# Patient Record
Sex: Male | Born: 1940
Health system: Southern US, Community
[De-identification: ages and names within clinical notes are randomized; demographics above are authoritative.]

## PROBLEM LIST (undated history)

## (undated) ENCOUNTER — Inpatient Hospital Stay: Admission: EM | Payer: Self-pay | Source: Home / Self Care

## (undated) DIAGNOSIS — C4491 Basal cell carcinoma of skin, unspecified: Secondary | ICD-10-CM

## (undated) DIAGNOSIS — M3500 Sicca syndrome, unspecified: Secondary | ICD-10-CM

## (undated) DIAGNOSIS — B0229 Other postherpetic nervous system involvement: Secondary | ICD-10-CM

## (undated) DIAGNOSIS — H35352 Cystoid macular degeneration, left eye: Secondary | ICD-10-CM

## (undated) HISTORY — PX: OTHER SURGICAL HISTORY: SHX169

---

## 1998-12-24 ENCOUNTER — Encounter: Payer: Self-pay | Admitting: Orthopedic Surgery

## 1998-12-24 ENCOUNTER — Ambulatory Visit (HOSPITAL_COMMUNITY): Admission: RE | Admit: 1998-12-24 | Discharge: 1998-12-24 | Payer: Self-pay | Admitting: Orthopedic Surgery

## 2008-01-23 ENCOUNTER — Encounter: Admission: RE | Admit: 2008-01-23 | Discharge: 2008-01-23 | Payer: Self-pay | Admitting: Chiropractic Medicine

## 2009-09-08 IMAGING — CR DG LUMBAR SPINE 1V
1 series · 1 of 1 positions shown · non-contrast
Comparison: none

CLINICAL DATA: Pain. 
 LUMBAR SPINE ONE VIEW:
 Only a lateral view of the lumbar spine was requested.  There is degenerative disc disease most marked at L4-5 but also present at L5-S1 and L2-3 levels with loss of disc space, sclerosis, and some spur formation.  Grossly normal alignment is maintained.

[w l-spine lat]
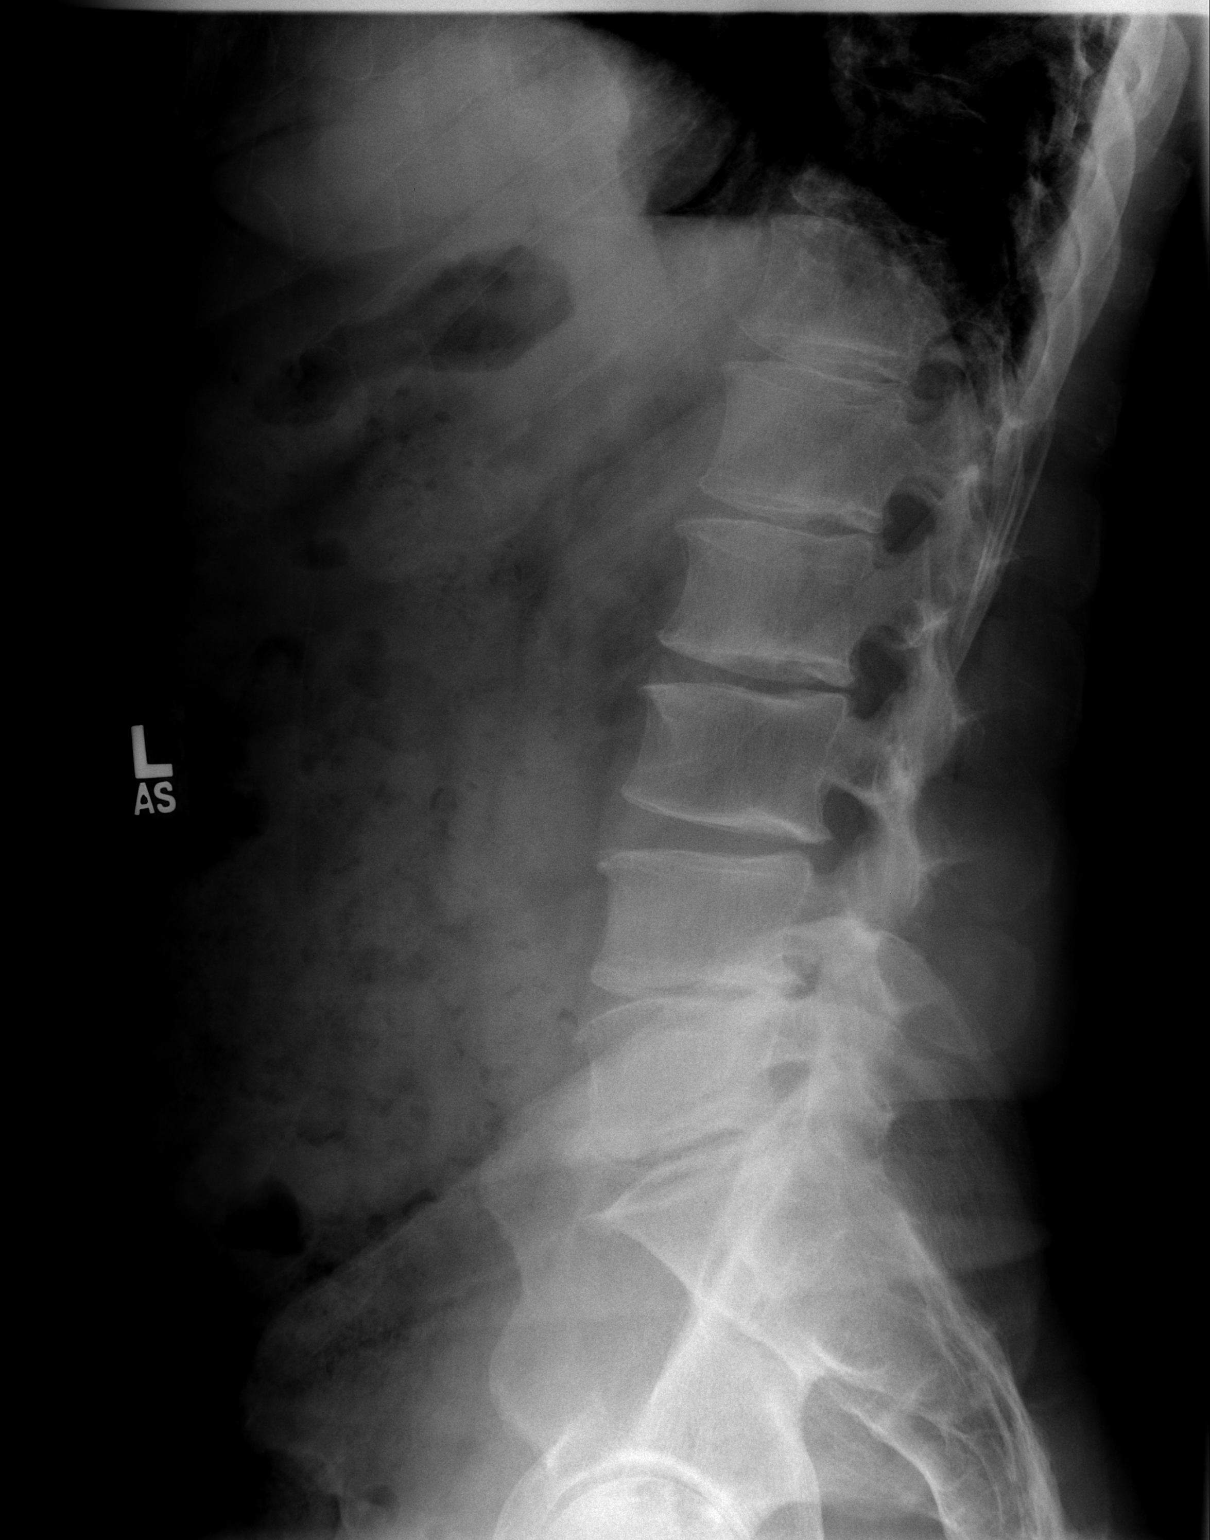

[1 of 1 positions shown; findings below may reference images not displayed]

IMPRESSION: Degenerative disc disease at L4-5 and to a lesser degree L2-3 and L5-S1.

## 2009-09-08 IMAGING — CR DG CERVICAL SPINE 2 OR 3 VIEWS
3 series · 3 of 3 positions shown · non-contrast
Comparison: none

CLINICAL DATA: Cervical radiculopathy.  
 CERVICAL SPINE TWO VIEWS:

[w c-spine lat *]
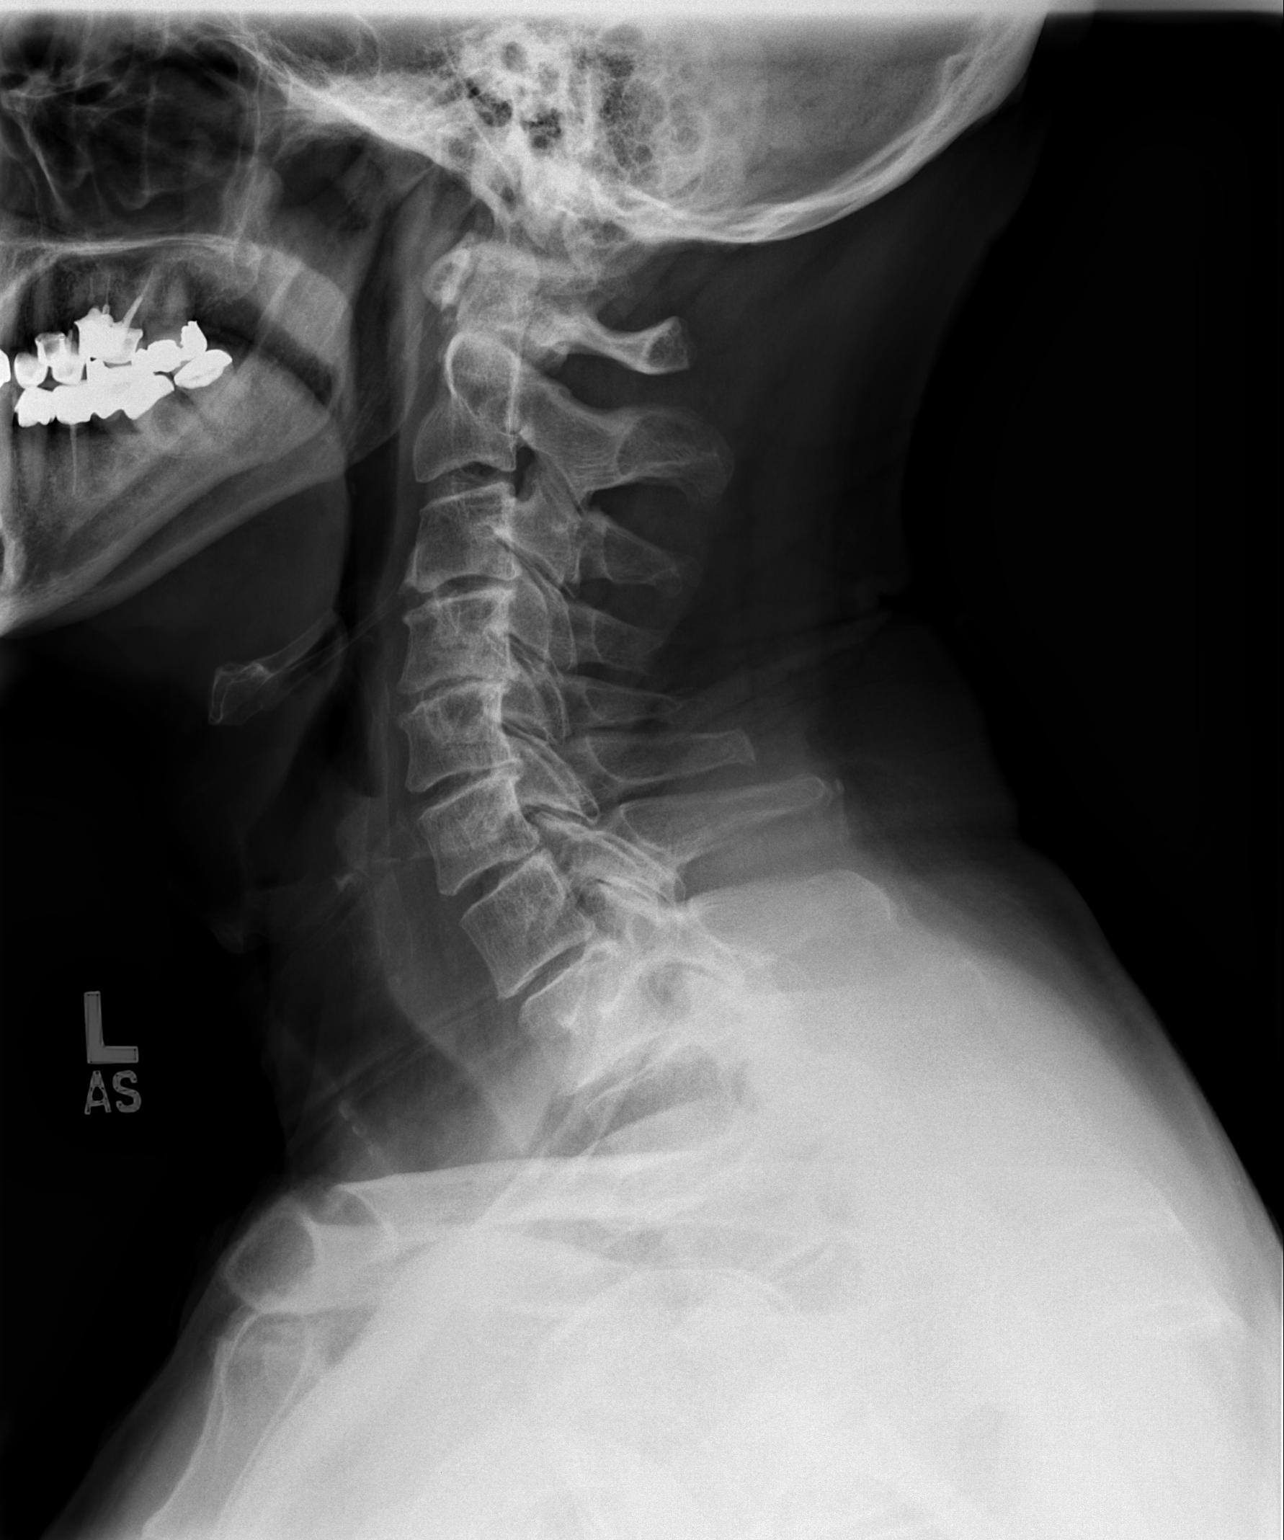

[w c-spine odontoid * (1 of 2)]
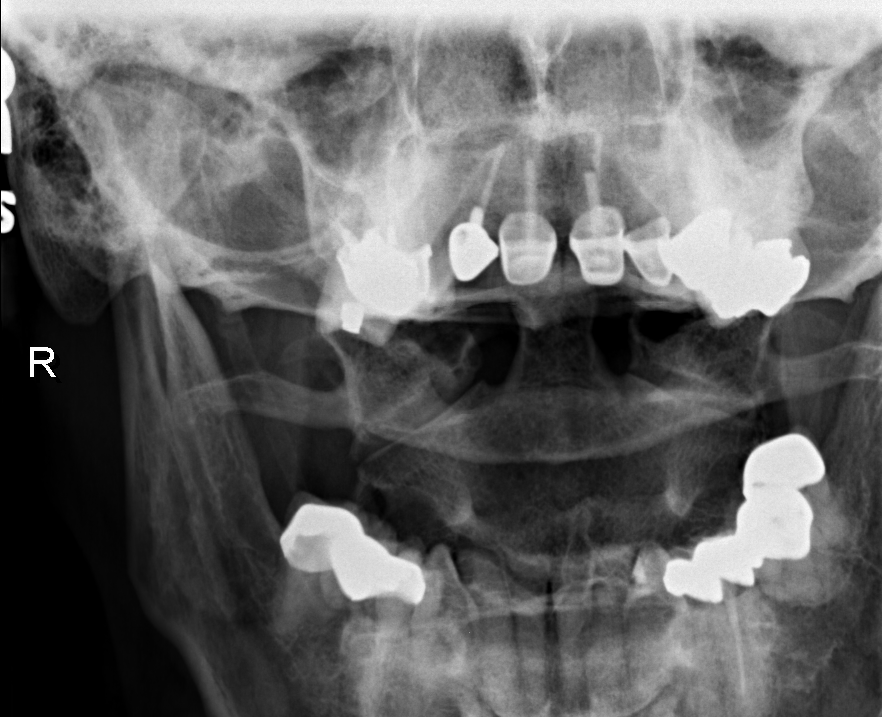

[w c-spine odontoid * (2 of 2)]
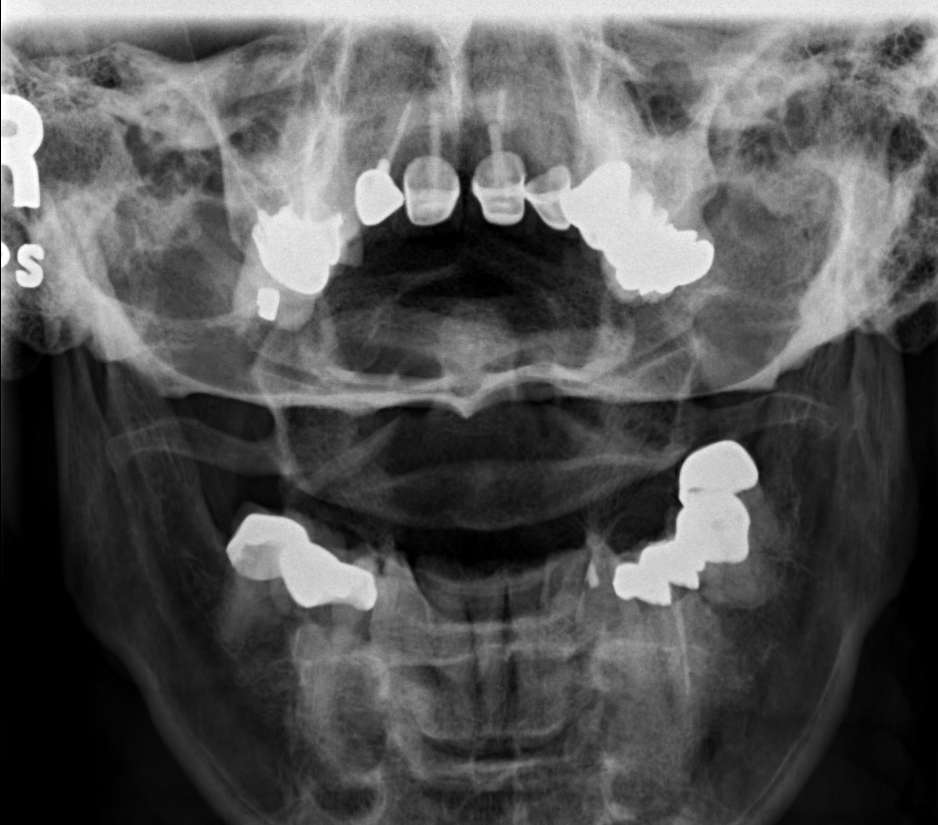

[3 of 3 positions shown; findings below may reference images not displayed]

FINDINGS: The cervical vertebrae are in normal alignment.  There is however degenerative disc disease at C3-4, C4-5, C5-6, and to a lesser degree C6-7 levels with a loss of disc space, sclerosis, and spur formation.  No prevertebral soft tissue swelling is seen.  The odontoid process is intact.  Oblique views would be helpful to assess for foraminal narrowing.
IMPRESSION: Normal alignment with diffuse degenerative disc disease from C3-4 to C6-7.  Recommend oblique views to assess for foraminal narrowing.  No acute abnormality.

## 2013-02-21 ENCOUNTER — Other Ambulatory Visit: Payer: Self-pay | Admitting: Gastroenterology

## 2013-03-02 ENCOUNTER — Encounter (HOSPITAL_COMMUNITY): Payer: Self-pay | Admitting: *Deleted

## 2013-03-02 NOTE — Pre-Procedure Instructions (Signed)
Your procedure is scheduled ZO:[id number], March 13, 2013 Report to [facility] Admitting at: Call this number if you have problems morning of your [phone]  Follow all bowel prep instructions per your doctor's orders.  Do not eat or drink anything after midnight the night before your procedure. You may brush your teeth, rinse out your mouth, but no water, no food, no chewing gum, no mints, no candies, no chewing tobacco.     Take these medicines the morning of your procedure with A SIP OF WATER:None   Please make arrangements for a responsible person to drive you home after the procedure. You cannot go home by cab/taxi. We recommend you have someone with you at home the first 24 hours after your procedure. Driver for procedure is wife Mohamad Bruso [phone]  LEAVE ALL VALUABLES, JEWELRY, BILLFOLD AT HOME.  NO DENTURES, CONTACT LENSES ALLOWED IN THE ENDOSCOPY ROOM.   YOU MAY WEAR DEODORANT, PLEASE REMOVE ALL JEWELRY, WATCHES RINGS, BODY PIERCINGS AND LEAVE AT HOME.   WOMEN: NO MAKE-UP, LOTIONS PERFUMES

## 2013-03-06 ENCOUNTER — Encounter (HOSPITAL_COMMUNITY): Payer: Self-pay | Admitting: Pharmacy Technician

## 2013-03-13 ENCOUNTER — Encounter (HOSPITAL_COMMUNITY): Payer: Self-pay | Admitting: Anesthesiology

## 2013-03-13 ENCOUNTER — Ambulatory Visit (HOSPITAL_COMMUNITY)
Admission: RE | Admit: 2013-03-13 | Discharge: 2013-03-13 | Disposition: A | Discharge status: Hospice | Payer: Medicare Other | Source: Ambulatory Visit | Attending: Gastroenterology | Admitting: Gastroenterology

## 2013-03-13 ENCOUNTER — Ambulatory Visit (HOSPITAL_COMMUNITY): Payer: Medicare Other | Admitting: Anesthesiology

## 2013-03-13 ENCOUNTER — Encounter (HOSPITAL_COMMUNITY)
Admission: RE | Disposition: A | Discharge status: Hospice | Payer: Self-pay | Source: Ambulatory Visit | Attending: Gastroenterology

## 2013-03-13 DIAGNOSIS — Z85828 Personal history of other malignant neoplasm of skin: Secondary | ICD-10-CM | POA: Insufficient documentation

## 2013-03-13 DIAGNOSIS — Z1211 Encounter for screening for malignant neoplasm of colon: Secondary | ICD-10-CM | POA: Insufficient documentation

## 2013-03-13 DIAGNOSIS — M1990 Unspecified osteoarthritis, unspecified site: Secondary | ICD-10-CM | POA: Insufficient documentation

## 2013-03-13 HISTORY — PX: COLONOSCOPY WITH PROPOFOL: SHX5780

## 2013-03-13 HISTORY — DX: Basal cell carcinoma of skin, unspecified: C44.91

## 2013-03-13 SURGERY — COLONOSCOPY WITH PROPOFOL
Anesthesia: Monitor Anesthesia Care

## 2013-03-13 MED ORDER — LACTATED RINGERS IV SOLN
INTRAVENOUS | Status: DC | PRN
  Administered 2013-03-13: 08:00:00 via INTRAVENOUS

## 2013-03-13 MED ORDER — SODIUM CHLORIDE 0.9 % IV SOLN: INTRAVENOUS | Status: DC

## 2013-03-13 MED ORDER — LACTATED RINGERS IV SOLN
INTRAVENOUS | Status: DC
  Administered 2013-03-13: 1000 mL via INTRAVENOUS

## 2013-03-13 MED ORDER — PROMETHAZINE HCL 25 MG/ML IJ SOLN: 6.2500 mg | INTRAMUSCULAR | Status: DC | PRN

## 2013-03-13 MED ORDER — MIDAZOLAM HCL 5 MG/5ML IJ SOLN
INTRAMUSCULAR | Status: DC | PRN
  Administered 2013-03-13: 1 mg via INTRAVENOUS

## 2013-03-13 MED ORDER — FENTANYL CITRATE 0.05 MG/ML IJ SOLN
INTRAMUSCULAR | Status: DC | PRN
  Administered 2013-03-13: 50 ug via INTRAVENOUS

## 2013-03-13 MED ORDER — PROPOFOL 10 MG/ML IV BOLUS
INTRAVENOUS | Status: DC | PRN
  Administered 2013-03-13: 50 mg via INTRAVENOUS
  Administered 2013-03-13: 30 mg via INTRAVENOUS
  Administered 2013-03-13: 20 mg via INTRAVENOUS

## 2013-03-13 SURGICAL SUPPLY — 21 items

## 2013-03-13 NOTE — Anesthesia Preprocedure Evaluation (Addendum)
Anesthesia Evaluation  Patient identified by MRN, date of birth, ID band Patient awake    Reviewed: Allergy & Precautions, H&P , NPO status , Patient's Chart, lab work & pertinent test results  History of Anesthesia Complications (+) AWARENESS UNDER ANESTHESIA  Airway Mallampati: II TM Distance: >3 FB     Dental no notable dental hx. (+) Teeth Intact and Dental Advisory Given   Pulmonary neg pulmonary ROS,    Pulmonary exam normal       Cardiovascular negative cardio ROS      Neuro/Psych negative neurological ROS  negative psych ROS   GI/Hepatic negative GI ROS, Neg liver ROS,   Endo/Other  negative endocrine ROS  Renal/GU negative Renal ROS  negative genitourinary   Musculoskeletal negative musculoskeletal ROS (+)   Abdominal   Peds  Hematology negative hematology ROS (+)   Anesthesia Other Findings   Reproductive/Obstetrics negative OB ROS                        Anesthesia Physical Anesthesia Plan  ASA: I  Anesthesia Plan: MAC   Post-op Pain Management:    Induction: Intravenous  Airway Management Planned: Simple Face Mask  Additional Equipment:   Intra-op Plan:   Post-operative Plan:   Informed Consent: I have reviewed the patients History and Physical, chart, labs and discussed the procedure including the risks, benefits and alternatives for the proposed anesthesia with the patient or authorized representative who has indicated his/her understanding and acceptance.   Dental advisory given  Plan Discussed with: CRNA  Anesthesia Plan Comments:         Anesthesia Quick Evaluation

## 2013-03-13 NOTE — H&P (Signed)
  Procedure screening colonoscopy  History: The patient is a 72 year old male born 1941/10/07. On 01/31/2001, the patient underwent a normal baseline screening colonoscopy. He is scheduled to undergo a repeat screening colonoscopy today.  Medication allergies: None.  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 Degenerative joint disease.  Habits: The patient does not smoke cigarettes and consumes alcohol in moderation.  Plan: Proceed with screening colonoscopy.

## 2013-03-13 NOTE — Op Note (Signed)
Procedure: Screening colonoscopy  Endoscopist: Danise Edge  Premedication: Propofol administered by anesthesia  Procedure: The patient was placed in the left lateral decubitus position. Anal inspection and digital rectal exam were normal. The Pentax pediatric colonoscope was introduced into the rectum and easily advanced to the cecum. A normal-appearing ileocecal valve and appendiceal orifice were identified. Colonic preparation for the exam today was good.  Rectum. Normal. Retroflex view of the distal rectum normal.  Sigmoid colon and descending colon. Normal.  Splenic flexure. Normal.  Transverse colon. Normal.  Hepatic flexure. Normal. Descending colon..  Ascending colon. Normal.  Cecum and ileocecal valve. Normal.  Assessment: Normal screening proctocolonoscopy to the cecum.

## 2013-03-13 NOTE — Anesthesia Postprocedure Evaluation (Signed)
Anesthesia Post Note  Patient: Sean Skinner  Procedure(s) Performed: Procedure(s) (LRB): COLONOSCOPY WITH PROPOFOL (N/A)  Anesthesia type: MAC  Patient location: PACU  Post pain: Pain level controlled  Post assessment: Post-op Vital signs reviewed  Last Vitals:  Filed Vitals:   03/13/13 0950  BP: 132/77  Pulse:   Temp:   Resp: 15    Post vital signs: Reviewed  Level of consciousness: sedated  Complications: No apparent anesthesia complications

## 2013-03-13 NOTE — Transfer of Care (Signed)
Immediate Anesthesia Transfer of Care Note  Patient: Sean Skinner  Procedure(s) Performed: Procedure(s): COLONOSCOPY WITH PROPOFOL (N/A)  Patient Location: PACU  Anesthesia Type:MAC  Level of Consciousness: awake, alert , oriented and patient cooperative  Airway & Oxygen Therapy: Patient Spontanous Breathing and Patient connected to face mask oxygen  Post-op Assessment: Report given to PACU RN and Post -op Vital signs reviewed and stable  Post vital signs: Reviewed and stable  Complications: No apparent anesthesia complications

## 2013-03-14 ENCOUNTER — Encounter (HOSPITAL_COMMUNITY): Payer: Self-pay | Admitting: Gastroenterology

## 2016-04-26 DIAGNOSIS — J40 Bronchitis, not specified as acute or chronic: Secondary | ICD-10-CM | POA: Diagnosis not present

## 2016-08-05 DIAGNOSIS — C4442 Squamous cell carcinoma of skin of scalp and neck: Secondary | ICD-10-CM | POA: Diagnosis not present

## 2016-08-05 DIAGNOSIS — C44622 Squamous cell carcinoma of skin of right upper limb, including shoulder: Secondary | ICD-10-CM | POA: Diagnosis not present

## 2016-08-05 DIAGNOSIS — L570 Actinic keratosis: Secondary | ICD-10-CM | POA: Diagnosis not present

## 2016-08-05 DIAGNOSIS — C44629 Squamous cell carcinoma of skin of left upper limb, including shoulder: Secondary | ICD-10-CM | POA: Diagnosis not present

## 2016-10-02 DIAGNOSIS — Z1389 Encounter for screening for other disorder: Secondary | ICD-10-CM | POA: Diagnosis not present

## 2016-10-02 DIAGNOSIS — Z0000 Encounter for general adult medical examination without abnormal findings: Secondary | ICD-10-CM | POA: Diagnosis not present

## 2016-10-27 DIAGNOSIS — L814 Other melanin hyperpigmentation: Secondary | ICD-10-CM | POA: Diagnosis not present

## 2016-10-27 DIAGNOSIS — L570 Actinic keratosis: Secondary | ICD-10-CM | POA: Diagnosis not present

## 2016-10-27 DIAGNOSIS — C44529 Squamous cell carcinoma of skin of other part of trunk: Secondary | ICD-10-CM | POA: Diagnosis not present

## 2016-10-27 DIAGNOSIS — Z85828 Personal history of other malignant neoplasm of skin: Secondary | ICD-10-CM | POA: Diagnosis not present

## 2016-10-27 DIAGNOSIS — D034 Melanoma in situ of scalp and neck: Secondary | ICD-10-CM | POA: Diagnosis not present

## 2016-10-27 DIAGNOSIS — D485 Neoplasm of uncertain behavior of skin: Secondary | ICD-10-CM | POA: Diagnosis not present

## 2016-11-06 DIAGNOSIS — D0359 Melanoma in situ of other part of trunk: Secondary | ICD-10-CM | POA: Diagnosis not present

## 2017-02-02 DIAGNOSIS — L821 Other seborrheic keratosis: Secondary | ICD-10-CM | POA: Diagnosis not present

## 2017-02-02 DIAGNOSIS — Z85820 Personal history of malignant melanoma of skin: Secondary | ICD-10-CM | POA: Diagnosis not present

## 2017-02-02 DIAGNOSIS — L578 Other skin changes due to chronic exposure to nonionizing radiation: Secondary | ICD-10-CM | POA: Diagnosis not present

## 2017-02-02 DIAGNOSIS — L814 Other melanin hyperpigmentation: Secondary | ICD-10-CM | POA: Diagnosis not present

## 2017-02-02 DIAGNOSIS — L570 Actinic keratosis: Secondary | ICD-10-CM | POA: Diagnosis not present

## 2017-02-02 DIAGNOSIS — L905 Scar conditions and fibrosis of skin: Secondary | ICD-10-CM | POA: Diagnosis not present

## 2017-02-02 DIAGNOSIS — D225 Melanocytic nevi of trunk: Secondary | ICD-10-CM | POA: Diagnosis not present

## 2017-05-04 DIAGNOSIS — L570 Actinic keratosis: Secondary | ICD-10-CM | POA: Diagnosis not present

## 2017-06-15 DIAGNOSIS — D485 Neoplasm of uncertain behavior of skin: Secondary | ICD-10-CM | POA: Diagnosis not present

## 2017-06-15 DIAGNOSIS — C44319 Basal cell carcinoma of skin of other parts of face: Secondary | ICD-10-CM | POA: Diagnosis not present

## 2017-06-15 DIAGNOSIS — L570 Actinic keratosis: Secondary | ICD-10-CM | POA: Diagnosis not present

## 2017-06-17 ENCOUNTER — Encounter: Payer: Self-pay | Admitting: Radiation Oncology

## 2017-06-18 ENCOUNTER — Encounter: Payer: Self-pay | Admitting: Radiation Oncology

## 2017-06-21 NOTE — Progress Notes (Signed)
Histology and Location of Primary Skin Cancer:  Basal cell right forehead    Biopsy6/19/18: Right forehead=Basl cell Carcinoma, infiltrative pattern  Sean Skinner presented with the following signs/symptoms,   months ago: irregular papule right forehead ,  Color,changing in texture,scaling and pink   Past/Anticipated interventions by patient's surgeon/dermatologist for current problematic lesion, if any: previous treatments liquid nitrogen, face, scalp,ear area  Dr. Druscilla Brownie, Md Past skin cancers, if [id number]   Mohs surgery 2 x,   1) Location/Histology/Intervention:   2) Location/Histology/Intervention:   3) Location/Histology/Intervention:   History of Blistering sunburns, if any: yes, [place]  [name]: NO  Prior radiation? NO    Pacemaker/ICD? NO  Is the patient on methotrexate? NO  Current Complaints / other details:  Wants right forehead and B/L face    Wt Readings from Last 3 Encounters:  03/13/13 198 lb (89.8 kg)   BP (!) 151/80   Pulse 81   Temp 97.9 F (36.6 C) (Oral)   Resp 20   Ht 6' 2.5" (1.892 m)   Wt 197 lb 12.8 oz (89.7 kg)   BMI 25.06 kg/m

## 2017-06-24 ENCOUNTER — Encounter: Payer: Self-pay | Admitting: Radiation Oncology

## 2017-06-24 ENCOUNTER — Ambulatory Visit
Admission: RE | Admit: 2017-06-24 | Discharge: 2017-06-24 | Disposition: A | Discharge status: Home / Self Care | Payer: Medicare HMO | Source: Ambulatory Visit | Attending: Radiation Oncology | Admitting: Radiation Oncology

## 2017-06-24 VITALS — BP 151/80 | HR 81 | Temp 97.9°F | Resp 20 | Ht 74.5 in | Wt 197.8 lb

## 2017-06-24 DIAGNOSIS — Z9889 Other specified postprocedural states: Secondary | ICD-10-CM | POA: Insufficient documentation

## 2017-06-24 DIAGNOSIS — L570 Actinic keratosis: Secondary | ICD-10-CM | POA: Diagnosis not present

## 2017-06-24 DIAGNOSIS — Z510 Encounter for antineoplastic radiation therapy: Secondary | ICD-10-CM | POA: Diagnosis not present

## 2017-06-24 DIAGNOSIS — Z87891 Personal history of nicotine dependence: Secondary | ICD-10-CM | POA: Insufficient documentation

## 2017-06-24 DIAGNOSIS — Z79899 Other long term (current) drug therapy: Secondary | ICD-10-CM | POA: Diagnosis not present

## 2017-06-24 DIAGNOSIS — C44319 Basal cell carcinoma of skin of other parts of face: Secondary | ICD-10-CM | POA: Insufficient documentation

## 2017-06-24 DIAGNOSIS — D049 Carcinoma in situ of skin, unspecified: Secondary | ICD-10-CM

## 2017-06-24 NOTE — Progress Notes (Signed)
Please see the Nurse Progress Note in the MD Initial Consult Encounter for this patient. 

## 2017-06-24 NOTE — Progress Notes (Signed)
Radiation Oncology         [phone] ________________________________  Name: BURDELL PEED MRN: [id number]  Date: 06/24/2017  DOB: January 28, 1941  [id number], Jenny Reichmann, MD  Druscilla Brownie, MD     REFERRING PHYSICIAN: Druscilla Brownie, MD   DIAGNOSIS: The encounter diagnosis was Basal cell carcinoma (BCC) of right forehead.  HISTORY OF PRESENT ILLNESS::Sean Skinner is a 76 y.o. male who is seen for an initial consultation visit. The patient has been referred to radiation oncology by Dr. Allyson Sabal in dermatology. On 06/15/17, a shave biopsy was performed on an irregular papule on the right forehead that was changing in color and texture (pink and scaling). Pathology revealed basal cell carcinoma. Pt has a hx of Mohs surgery x2.   Dr. Allyson Sabal has suggested radiation rather than Mohs in this case to avoid disfiguration.   PREVIOUS RADIATION THERAPY: No   PAST MEDICAL HISTORY:  has a past medical history of Basal cell cancer.     PAST SURGICAL HISTORY: Past Surgical History:  Procedure Laterality Date  . COLONOSCOPY WITH PROPOFOL N/A 03/13/2013   Procedure: COLONOSCOPY WITH PROPOFOL;  Surgeon: Garlan Fair, MD;  Location: [facility] ENDOSCOPY;  Service: Endoscopy;  Laterality: N/A;  . Removal of skin cancer       FAMILY HISTORY: family history is not on file.   SOCIAL HISTORY:  reports that he has never smoked. He has quit using smokeless tobacco. He reports that he drinks alcohol. He reports that he does not use drugs.   ALLERGIES: Patient has no known allergies.   MEDICATIONS:  Current Outpatient Prescriptions  Medication Sig Dispense Refill  . fish oil-omega-3 fatty acids 1000 MG capsule Take 2 g by mouth daily.     No current facility-administered medications for this encounter.      REVIEW OF SYSTEMS:  A complete review of systems was reviewed with the patient and pertinent positives findings are noted in the history of present illness.    PHYSICAL EXAM:  height is  6' 2.5" (1.892 m) and weight is 197 lb 12.8 oz (89.7 kg). His oral temperature is 97.9 F (36.6 C). His blood pressure is 151/80 (abnormal) and his pulse is 81. His respiration is 20.    ECOG = 0  0 - Asymptomatic (Fully active, able to carry on all predisease activities without restriction)  1 - Symptomatic but completely ambulatory (Restricted in physically strenuous activity but ambulatory and able to carry out work of a light or sedentary nature. For example, light housework, office work)  2 - Symptomatic, <50% in bed during the day (Ambulatory and capable of all self care but unable to carry out any work activities. Up and about more than 50% of waking hours)  3 - Symptomatic, >50% in bed, but not bedbound (Capable of only limited self-care, confined to bed or chair 50% or more of waking hours)  4 - Bedbound (Completely disabled. Cannot carry on any self-care. Totally confined to bed or chair)  5 - Death   Eustace Pen MM, Creech RH, Tormey DC, et al. [phone]). "Toxicity and response criteria of the [facility]". [place] Oncol. 5 (6): 649-55    The patient is status post biopsy of the right for head the site where this returned basal cell carcinoma. The site looks appropriate for radiation treatment. He also has extensive changes consistent with actinic keratosis in the right and facial regions more probably.   LABORATORY DATA:  No results found for: WBC, HGB,  HCT, MCV, PLT No results found for: NA, K, CL, CO2 No results found for: ALT, AST, GGT, ALKPHOS, BILITOT    RADIOGRAPHY: No results found.     IMPRESSION: Basal Cell Carcinoma of Right Forehead  I recommended a course of radiation to the right forehead. We discussed the possible side effects and risks of treatment in addition to the possible benefits of treatment. All of the patient's questions were answered. The patient does wish to proceed with this treatment. I am therefore recommending a 2-3 week  course of treatment. He will be scheduled for a simulation such that we can proceed with treatment planning.  I will call the dermatologist to discuss the other actinic keratoses the patient mentioned during our visit.    I spent 30 minutes face to face with the patient and more than 50% of that time was spent in counseling and/or coordination of care.    ________________________________   Jodelle Gross, MD, PhD   **Disclaimer: This note was dictated with voice recognition software. Similar sounding words can inadvertently be transcribed and this note may contain transcription errors which may not have been corrected upon publication of note.**  This document serves as a record of services personally performed by Kyung Rudd, MD. It was created on his behalf by Linward Natal, a trained medical scribe. The creation of this record is based on the scribe's personal observations and the provider's statements to them. This document has been checked and approved by the attending provider.

## 2017-06-25 DIAGNOSIS — C44319 Basal cell carcinoma of skin of other parts of face: Secondary | ICD-10-CM | POA: Diagnosis not present

## 2017-07-02 ENCOUNTER — Ambulatory Visit: Admission: RE | Admit: 2017-07-02 | Payer: Medicare HMO | Source: Ambulatory Visit | Admitting: Radiation Oncology

## 2017-07-05 ENCOUNTER — Ambulatory Visit: Payer: Medicare HMO

## 2017-07-05 ENCOUNTER — Ambulatory Visit: Payer: Medicare HMO | Admitting: Radiation Oncology

## 2017-07-06 ENCOUNTER — Ambulatory Visit
Admission: RE | Admit: 2017-07-06 | Discharge: 2017-07-06 | Disposition: A | Discharge status: Home / Self Care | Payer: Medicare HMO | Source: Ambulatory Visit | Attending: Radiation Oncology | Admitting: Radiation Oncology

## 2017-07-06 DIAGNOSIS — Z9889 Other specified postprocedural states: Secondary | ICD-10-CM | POA: Diagnosis not present

## 2017-07-06 DIAGNOSIS — Z87891 Personal history of nicotine dependence: Secondary | ICD-10-CM | POA: Diagnosis not present

## 2017-07-06 DIAGNOSIS — C44319 Basal cell carcinoma of skin of other parts of face: Secondary | ICD-10-CM

## 2017-07-06 DIAGNOSIS — Z510 Encounter for antineoplastic radiation therapy: Secondary | ICD-10-CM | POA: Diagnosis not present

## 2017-07-06 DIAGNOSIS — Z79899 Other long term (current) drug therapy: Secondary | ICD-10-CM | POA: Diagnosis not present

## 2017-07-07 ENCOUNTER — Ambulatory Visit: Payer: Medicare HMO | Admitting: Radiation Oncology

## 2017-07-07 ENCOUNTER — Ambulatory Visit: Payer: Medicare HMO

## 2017-07-07 ENCOUNTER — Ambulatory Visit: Admission: RE | Admit: 2017-07-07 | Payer: Medicare HMO | Source: Ambulatory Visit

## 2017-07-07 DIAGNOSIS — Z9889 Other specified postprocedural states: Secondary | ICD-10-CM | POA: Diagnosis not present

## 2017-07-07 DIAGNOSIS — Z87891 Personal history of nicotine dependence: Secondary | ICD-10-CM | POA: Diagnosis not present

## 2017-07-07 DIAGNOSIS — C44319 Basal cell carcinoma of skin of other parts of face: Secondary | ICD-10-CM | POA: Insufficient documentation

## 2017-07-07 DIAGNOSIS — Z79899 Other long term (current) drug therapy: Secondary | ICD-10-CM | POA: Diagnosis not present

## 2017-07-07 DIAGNOSIS — Z510 Encounter for antineoplastic radiation therapy: Secondary | ICD-10-CM | POA: Diagnosis not present

## 2017-07-08 ENCOUNTER — Ambulatory Visit
Admission: RE | Admit: 2017-07-08 | Discharge: 2017-07-08 | Disposition: A | Discharge status: Home / Self Care | Payer: Medicare HMO | Source: Ambulatory Visit | Attending: Radiation Oncology | Admitting: Radiation Oncology

## 2017-07-08 ENCOUNTER — Ambulatory Visit: Payer: Medicare HMO

## 2017-07-08 DIAGNOSIS — C44319 Basal cell carcinoma of skin of other parts of face: Secondary | ICD-10-CM

## 2017-07-08 DIAGNOSIS — Z87891 Personal history of nicotine dependence: Secondary | ICD-10-CM | POA: Diagnosis not present

## 2017-07-08 DIAGNOSIS — Z9889 Other specified postprocedural states: Secondary | ICD-10-CM | POA: Diagnosis not present

## 2017-07-08 DIAGNOSIS — Z510 Encounter for antineoplastic radiation therapy: Secondary | ICD-10-CM | POA: Diagnosis not present

## 2017-07-08 DIAGNOSIS — Z79899 Other long term (current) drug therapy: Secondary | ICD-10-CM | POA: Diagnosis not present

## 2017-07-08 MED ORDER — BIAFINE EX EMUL
Freq: Every day | CUTANEOUS | Status: DC
  Administered 2017-07-08: 16:00:00 via TOPICAL

## 2017-07-08 NOTE — Progress Notes (Signed)
Pt education,  biafibne cream , my buisiness card given,

## 2017-07-08 NOTE — Progress Notes (Signed)
Pt education,  biafibne cream , my buisiness card given,  Skin irritation and fatigue 2 side effects, to apply biafine to affected area after rad txs daily, use dove soap unscented, no rubbing,scrubbing or scratching face  And scalp where radiation is being targeted, increase protein and stay hydrated, wear hat when going outdoors and sunscreen,  Will go over again tomorrow

## 2017-07-09 ENCOUNTER — Ambulatory Visit
Admission: RE | Admit: 2017-07-09 | Discharge: 2017-07-09 | Disposition: A | Discharge status: Home / Self Care | Payer: Medicare HMO | Source: Ambulatory Visit | Attending: Radiation Oncology | Admitting: Radiation Oncology

## 2017-07-09 DIAGNOSIS — Z9889 Other specified postprocedural states: Secondary | ICD-10-CM | POA: Diagnosis not present

## 2017-07-09 DIAGNOSIS — C44319 Basal cell carcinoma of skin of other parts of face: Secondary | ICD-10-CM | POA: Diagnosis not present

## 2017-07-09 DIAGNOSIS — Z510 Encounter for antineoplastic radiation therapy: Secondary | ICD-10-CM | POA: Diagnosis not present

## 2017-07-09 DIAGNOSIS — Z79899 Other long term (current) drug therapy: Secondary | ICD-10-CM | POA: Diagnosis not present

## 2017-07-09 DIAGNOSIS — Z87891 Personal history of nicotine dependence: Secondary | ICD-10-CM | POA: Diagnosis not present

## 2017-07-12 ENCOUNTER — Ambulatory Visit
Admission: RE | Admit: 2017-07-12 | Discharge: 2017-07-12 | Disposition: A | Discharge status: Home / Self Care | Payer: Medicare HMO | Source: Ambulatory Visit | Attending: Radiation Oncology | Admitting: Radiation Oncology

## 2017-07-12 DIAGNOSIS — C44319 Basal cell carcinoma of skin of other parts of face: Secondary | ICD-10-CM | POA: Diagnosis not present

## 2017-07-12 DIAGNOSIS — Z9889 Other specified postprocedural states: Secondary | ICD-10-CM | POA: Diagnosis not present

## 2017-07-12 DIAGNOSIS — Z87891 Personal history of nicotine dependence: Secondary | ICD-10-CM | POA: Diagnosis not present

## 2017-07-12 DIAGNOSIS — Z79899 Other long term (current) drug therapy: Secondary | ICD-10-CM | POA: Diagnosis not present

## 2017-07-12 DIAGNOSIS — Z510 Encounter for antineoplastic radiation therapy: Secondary | ICD-10-CM | POA: Diagnosis not present

## 2017-07-13 ENCOUNTER — Ambulatory Visit
Admission: RE | Admit: 2017-07-13 | Discharge: 2017-07-13 | Disposition: A | Discharge status: Home / Self Care | Payer: Medicare HMO | Source: Ambulatory Visit | Attending: Radiation Oncology | Admitting: Radiation Oncology

## 2017-07-13 DIAGNOSIS — Z9889 Other specified postprocedural states: Secondary | ICD-10-CM | POA: Diagnosis not present

## 2017-07-13 DIAGNOSIS — Z510 Encounter for antineoplastic radiation therapy: Secondary | ICD-10-CM | POA: Diagnosis not present

## 2017-07-13 DIAGNOSIS — Z79899 Other long term (current) drug therapy: Secondary | ICD-10-CM | POA: Diagnosis not present

## 2017-07-13 DIAGNOSIS — C44319 Basal cell carcinoma of skin of other parts of face: Secondary | ICD-10-CM | POA: Diagnosis not present

## 2017-07-13 DIAGNOSIS — Z87891 Personal history of nicotine dependence: Secondary | ICD-10-CM | POA: Diagnosis not present

## 2017-07-14 ENCOUNTER — Ambulatory Visit
Admission: RE | Admit: 2017-07-14 | Discharge: 2017-07-14 | Disposition: A | Discharge status: Home / Self Care | Payer: Medicare HMO | Source: Ambulatory Visit | Attending: Radiation Oncology | Admitting: Radiation Oncology

## 2017-07-14 DIAGNOSIS — Z9889 Other specified postprocedural states: Secondary | ICD-10-CM | POA: Diagnosis not present

## 2017-07-14 DIAGNOSIS — Z79899 Other long term (current) drug therapy: Secondary | ICD-10-CM | POA: Diagnosis not present

## 2017-07-14 DIAGNOSIS — Z87891 Personal history of nicotine dependence: Secondary | ICD-10-CM | POA: Diagnosis not present

## 2017-07-14 DIAGNOSIS — Z510 Encounter for antineoplastic radiation therapy: Secondary | ICD-10-CM | POA: Diagnosis not present

## 2017-07-14 DIAGNOSIS — C44319 Basal cell carcinoma of skin of other parts of face: Secondary | ICD-10-CM | POA: Diagnosis not present

## 2017-07-15 ENCOUNTER — Ambulatory Visit
Admission: RE | Admit: 2017-07-15 | Discharge: 2017-07-15 | Disposition: A | Discharge status: Home / Self Care | Payer: Medicare HMO | Source: Ambulatory Visit | Attending: Radiation Oncology | Admitting: Radiation Oncology

## 2017-07-15 DIAGNOSIS — Z79899 Other long term (current) drug therapy: Secondary | ICD-10-CM | POA: Diagnosis not present

## 2017-07-15 DIAGNOSIS — Z87891 Personal history of nicotine dependence: Secondary | ICD-10-CM | POA: Diagnosis not present

## 2017-07-15 DIAGNOSIS — Z510 Encounter for antineoplastic radiation therapy: Secondary | ICD-10-CM | POA: Diagnosis not present

## 2017-07-15 DIAGNOSIS — Z9889 Other specified postprocedural states: Secondary | ICD-10-CM | POA: Diagnosis not present

## 2017-07-15 DIAGNOSIS — C44319 Basal cell carcinoma of skin of other parts of face: Secondary | ICD-10-CM | POA: Diagnosis not present

## 2017-07-16 ENCOUNTER — Ambulatory Visit
Admission: RE | Admit: 2017-07-16 | Discharge: 2017-07-16 | Disposition: A | Discharge status: Home / Self Care | Payer: Medicare HMO | Source: Ambulatory Visit | Attending: Radiation Oncology | Admitting: Radiation Oncology

## 2017-07-16 DIAGNOSIS — Z79899 Other long term (current) drug therapy: Secondary | ICD-10-CM | POA: Diagnosis not present

## 2017-07-16 DIAGNOSIS — C44319 Basal cell carcinoma of skin of other parts of face: Secondary | ICD-10-CM | POA: Diagnosis not present

## 2017-07-16 DIAGNOSIS — Z9889 Other specified postprocedural states: Secondary | ICD-10-CM | POA: Diagnosis not present

## 2017-07-16 DIAGNOSIS — Z510 Encounter for antineoplastic radiation therapy: Secondary | ICD-10-CM | POA: Diagnosis not present

## 2017-07-16 DIAGNOSIS — Z87891 Personal history of nicotine dependence: Secondary | ICD-10-CM | POA: Diagnosis not present

## 2017-07-19 ENCOUNTER — Ambulatory Visit
Admission: RE | Admit: 2017-07-19 | Discharge: 2017-07-19 | Disposition: A | Discharge status: Home / Self Care | Payer: Medicare HMO | Source: Ambulatory Visit | Attending: Radiation Oncology | Admitting: Radiation Oncology

## 2017-07-19 DIAGNOSIS — Z87891 Personal history of nicotine dependence: Secondary | ICD-10-CM | POA: Diagnosis not present

## 2017-07-19 DIAGNOSIS — Z9889 Other specified postprocedural states: Secondary | ICD-10-CM | POA: Diagnosis not present

## 2017-07-19 DIAGNOSIS — Z79899 Other long term (current) drug therapy: Secondary | ICD-10-CM | POA: Diagnosis not present

## 2017-07-19 DIAGNOSIS — C44319 Basal cell carcinoma of skin of other parts of face: Secondary | ICD-10-CM | POA: Diagnosis not present

## 2017-07-19 DIAGNOSIS — Z510 Encounter for antineoplastic radiation therapy: Secondary | ICD-10-CM | POA: Diagnosis not present

## 2017-07-20 ENCOUNTER — Ambulatory Visit: Payer: Medicare HMO

## 2017-07-20 ENCOUNTER — Ambulatory Visit
Admission: RE | Admit: 2017-07-20 | Discharge: 2017-07-20 | Disposition: A | Discharge status: Home / Self Care | Payer: Medicare HMO | Source: Ambulatory Visit | Attending: Radiation Oncology | Admitting: Radiation Oncology

## 2017-07-20 DIAGNOSIS — Z87891 Personal history of nicotine dependence: Secondary | ICD-10-CM | POA: Diagnosis not present

## 2017-07-20 DIAGNOSIS — Z9889 Other specified postprocedural states: Secondary | ICD-10-CM | POA: Diagnosis not present

## 2017-07-20 DIAGNOSIS — C44319 Basal cell carcinoma of skin of other parts of face: Secondary | ICD-10-CM | POA: Diagnosis not present

## 2017-07-20 DIAGNOSIS — Z510 Encounter for antineoplastic radiation therapy: Secondary | ICD-10-CM | POA: Diagnosis not present

## 2017-07-20 DIAGNOSIS — Z79899 Other long term (current) drug therapy: Secondary | ICD-10-CM | POA: Diagnosis not present

## 2017-07-21 ENCOUNTER — Ambulatory Visit
Admission: RE | Admit: 2017-07-21 | Discharge: 2017-07-21 | Disposition: A | Discharge status: Home / Self Care | Payer: Medicare HMO | Source: Ambulatory Visit | Attending: Radiation Oncology | Admitting: Radiation Oncology

## 2017-07-21 DIAGNOSIS — C44319 Basal cell carcinoma of skin of other parts of face: Secondary | ICD-10-CM

## 2017-07-21 DIAGNOSIS — Z79899 Other long term (current) drug therapy: Secondary | ICD-10-CM | POA: Diagnosis not present

## 2017-07-21 DIAGNOSIS — Z87891 Personal history of nicotine dependence: Secondary | ICD-10-CM | POA: Diagnosis not present

## 2017-07-21 DIAGNOSIS — Z510 Encounter for antineoplastic radiation therapy: Secondary | ICD-10-CM | POA: Diagnosis not present

## 2017-07-21 DIAGNOSIS — Z9889 Other specified postprocedural states: Secondary | ICD-10-CM | POA: Diagnosis not present

## 2017-07-23 DIAGNOSIS — Z87891 Personal history of nicotine dependence: Secondary | ICD-10-CM | POA: Diagnosis not present

## 2017-07-23 DIAGNOSIS — C44319 Basal cell carcinoma of skin of other parts of face: Secondary | ICD-10-CM | POA: Diagnosis not present

## 2017-07-23 DIAGNOSIS — Z9889 Other specified postprocedural states: Secondary | ICD-10-CM | POA: Diagnosis not present

## 2017-07-23 DIAGNOSIS — Z510 Encounter for antineoplastic radiation therapy: Secondary | ICD-10-CM | POA: Diagnosis not present

## 2017-07-23 DIAGNOSIS — Z79899 Other long term (current) drug therapy: Secondary | ICD-10-CM | POA: Diagnosis not present

## 2017-07-27 ENCOUNTER — Ambulatory Visit
Admission: RE | Admit: 2017-07-27 | Discharge: 2017-07-27 | Disposition: A | Discharge status: Home / Self Care | Payer: Medicare HMO | Source: Ambulatory Visit | Attending: Radiation Oncology | Admitting: Radiation Oncology

## 2017-07-27 DIAGNOSIS — Z9889 Other specified postprocedural states: Secondary | ICD-10-CM | POA: Diagnosis not present

## 2017-07-27 DIAGNOSIS — Z87891 Personal history of nicotine dependence: Secondary | ICD-10-CM | POA: Diagnosis not present

## 2017-07-27 DIAGNOSIS — C44319 Basal cell carcinoma of skin of other parts of face: Secondary | ICD-10-CM | POA: Diagnosis not present

## 2017-07-27 DIAGNOSIS — Z510 Encounter for antineoplastic radiation therapy: Secondary | ICD-10-CM | POA: Diagnosis not present

## 2017-07-27 DIAGNOSIS — Z79899 Other long term (current) drug therapy: Secondary | ICD-10-CM | POA: Diagnosis not present

## 2017-07-30 ENCOUNTER — Ambulatory Visit
Admission: RE | Admit: 2017-07-30 | Discharge: 2017-07-30 | Disposition: A | Discharge status: Home / Self Care | Payer: Medicare HMO | Source: Ambulatory Visit | Attending: Radiation Oncology | Admitting: Radiation Oncology

## 2017-07-30 DIAGNOSIS — Z510 Encounter for antineoplastic radiation therapy: Secondary | ICD-10-CM | POA: Diagnosis not present

## 2017-07-30 DIAGNOSIS — Z87891 Personal history of nicotine dependence: Secondary | ICD-10-CM | POA: Diagnosis not present

## 2017-07-30 DIAGNOSIS — C44319 Basal cell carcinoma of skin of other parts of face: Secondary | ICD-10-CM | POA: Diagnosis not present

## 2017-07-30 DIAGNOSIS — Z79899 Other long term (current) drug therapy: Secondary | ICD-10-CM | POA: Diagnosis not present

## 2017-07-30 DIAGNOSIS — Z9889 Other specified postprocedural states: Secondary | ICD-10-CM | POA: Diagnosis not present

## 2017-08-03 ENCOUNTER — Ambulatory Visit
Admission: RE | Admit: 2017-08-03 | Discharge: 2017-08-03 | Disposition: A | Discharge status: Home / Self Care | Payer: Medicare HMO | Source: Ambulatory Visit | Attending: Radiation Oncology | Admitting: Radiation Oncology

## 2017-08-03 DIAGNOSIS — C44319 Basal cell carcinoma of skin of other parts of face: Secondary | ICD-10-CM | POA: Diagnosis not present

## 2017-08-03 DIAGNOSIS — Z9889 Other specified postprocedural states: Secondary | ICD-10-CM | POA: Diagnosis not present

## 2017-08-03 DIAGNOSIS — Z87891 Personal history of nicotine dependence: Secondary | ICD-10-CM | POA: Diagnosis not present

## 2017-08-03 DIAGNOSIS — Z79899 Other long term (current) drug therapy: Secondary | ICD-10-CM | POA: Diagnosis not present

## 2017-08-03 DIAGNOSIS — Z510 Encounter for antineoplastic radiation therapy: Secondary | ICD-10-CM | POA: Diagnosis not present

## 2017-08-06 ENCOUNTER — Ambulatory Visit
Admission: RE | Admit: 2017-08-06 | Discharge: 2017-08-06 | Disposition: A | Discharge status: Home / Self Care | Payer: Medicare HMO | Source: Ambulatory Visit | Attending: Radiation Oncology | Admitting: Radiation Oncology

## 2017-08-06 DIAGNOSIS — Z79899 Other long term (current) drug therapy: Secondary | ICD-10-CM | POA: Diagnosis not present

## 2017-08-06 DIAGNOSIS — Z9889 Other specified postprocedural states: Secondary | ICD-10-CM | POA: Diagnosis not present

## 2017-08-06 DIAGNOSIS — C44319 Basal cell carcinoma of skin of other parts of face: Secondary | ICD-10-CM | POA: Diagnosis not present

## 2017-08-06 DIAGNOSIS — Z510 Encounter for antineoplastic radiation therapy: Secondary | ICD-10-CM | POA: Diagnosis not present

## 2017-08-06 DIAGNOSIS — Z87891 Personal history of nicotine dependence: Secondary | ICD-10-CM | POA: Diagnosis not present

## 2017-08-10 ENCOUNTER — Ambulatory Visit
Admission: RE | Admit: 2017-08-10 | Discharge: 2017-08-10 | Disposition: A | Discharge status: Home / Self Care | Payer: Medicare HMO | Source: Ambulatory Visit | Attending: Radiation Oncology | Admitting: Radiation Oncology

## 2017-08-10 DIAGNOSIS — Z9889 Other specified postprocedural states: Secondary | ICD-10-CM | POA: Diagnosis not present

## 2017-08-10 DIAGNOSIS — Z510 Encounter for antineoplastic radiation therapy: Secondary | ICD-10-CM | POA: Diagnosis not present

## 2017-08-10 DIAGNOSIS — Z87891 Personal history of nicotine dependence: Secondary | ICD-10-CM | POA: Diagnosis not present

## 2017-08-10 DIAGNOSIS — Z79899 Other long term (current) drug therapy: Secondary | ICD-10-CM | POA: Diagnosis not present

## 2017-08-10 DIAGNOSIS — C44319 Basal cell carcinoma of skin of other parts of face: Secondary | ICD-10-CM | POA: Diagnosis not present

## 2017-08-13 ENCOUNTER — Ambulatory Visit
Admission: RE | Admit: 2017-08-13 | Discharge: 2017-08-13 | Disposition: A | Discharge status: Home / Self Care | Payer: Medicare HMO | Source: Ambulatory Visit | Attending: Radiation Oncology | Admitting: Radiation Oncology

## 2017-08-13 DIAGNOSIS — C44319 Basal cell carcinoma of skin of other parts of face: Secondary | ICD-10-CM | POA: Diagnosis not present

## 2017-08-13 DIAGNOSIS — Z510 Encounter for antineoplastic radiation therapy: Secondary | ICD-10-CM | POA: Diagnosis not present

## 2017-08-13 DIAGNOSIS — Z79899 Other long term (current) drug therapy: Secondary | ICD-10-CM | POA: Diagnosis not present

## 2017-08-13 DIAGNOSIS — Z9889 Other specified postprocedural states: Secondary | ICD-10-CM | POA: Diagnosis not present

## 2017-08-13 DIAGNOSIS — Z87891 Personal history of nicotine dependence: Secondary | ICD-10-CM | POA: Diagnosis not present

## 2017-08-17 ENCOUNTER — Ambulatory Visit
Admission: RE | Admit: 2017-08-17 | Discharge: 2017-08-17 | Disposition: A | Discharge status: Home / Self Care | Payer: Medicare HMO | Source: Ambulatory Visit | Attending: Radiation Oncology | Admitting: Radiation Oncology

## 2017-08-17 ENCOUNTER — Encounter: Payer: Self-pay | Admitting: Radiation Oncology

## 2017-08-17 DIAGNOSIS — C44319 Basal cell carcinoma of skin of other parts of face: Secondary | ICD-10-CM | POA: Diagnosis not present

## 2017-08-17 DIAGNOSIS — Z79899 Other long term (current) drug therapy: Secondary | ICD-10-CM | POA: Diagnosis not present

## 2017-08-17 DIAGNOSIS — Z9889 Other specified postprocedural states: Secondary | ICD-10-CM | POA: Diagnosis not present

## 2017-08-17 DIAGNOSIS — Z87891 Personal history of nicotine dependence: Secondary | ICD-10-CM | POA: Diagnosis not present

## 2017-08-17 DIAGNOSIS — Z510 Encounter for antineoplastic radiation therapy: Secondary | ICD-10-CM | POA: Diagnosis not present

## 2017-08-20 ENCOUNTER — Ambulatory Visit: Payer: Medicare HMO

## 2017-08-24 ENCOUNTER — Ambulatory Visit: Payer: Medicare HMO

## 2017-08-27 ENCOUNTER — Ambulatory Visit: Payer: Medicare HMO

## 2017-09-15 DIAGNOSIS — L821 Other seborrheic keratosis: Secondary | ICD-10-CM | POA: Diagnosis not present

## 2017-09-15 DIAGNOSIS — Z85828 Personal history of other malignant neoplasm of skin: Secondary | ICD-10-CM | POA: Diagnosis not present

## 2017-09-15 DIAGNOSIS — D225 Melanocytic nevi of trunk: Secondary | ICD-10-CM | POA: Diagnosis not present

## 2017-09-15 DIAGNOSIS — L570 Actinic keratosis: Secondary | ICD-10-CM | POA: Diagnosis not present

## 2017-09-15 DIAGNOSIS — C4442 Squamous cell carcinoma of skin of scalp and neck: Secondary | ICD-10-CM | POA: Diagnosis not present

## 2017-09-15 DIAGNOSIS — Z85820 Personal history of malignant melanoma of skin: Secondary | ICD-10-CM | POA: Diagnosis not present

## 2017-09-15 DIAGNOSIS — C44622 Squamous cell carcinoma of skin of right upper limb, including shoulder: Secondary | ICD-10-CM | POA: Diagnosis not present

## 2017-09-22 DIAGNOSIS — C44329 Squamous cell carcinoma of skin of other parts of face: Secondary | ICD-10-CM | POA: Diagnosis not present

## 2017-09-22 NOTE — Progress Notes (Signed)
  Radiation Oncology         [phone] ________________________________  Name: Sean Skinner MRN: [id number]  Date: 08/17/2017  DOB: [birth year]  End of Treatment Note  Diagnosis:  Basal Cell Carcinoma of Right Forehead     Indication for treatment:  curative       Radiation treatment dates:   07/08/2017 to 08/17/2017  Site/dose:    1. The HN scalp was treated to 40 Gy in 10 fractions at 4 Gy per fraction.  2. The Left and Right cheek were treated to 28 Gy in 7 fractions at 4 Gy per fraction.   Beams/energy:    1. En face electron // 6E 2. En face electron // 6E  Narrative: The patient tolerated radiation treatment relatively well.   Denies any fatigue or pain. Reports he does have some itching and burning on his skin but that it is tolerable. Using cream as directed.   Plan: The patient has completed radiation treatment. The patient will return to radiation oncology clinic for routine followup in one month. I advised them to call or return sooner if they have any questions or concerns related to their recovery or treatment.  ------------------------------------------------  Jodelle Gross, MD, PhD  This document serves as a record of services personally performed by Kyung Rudd, MD. It was created on his behalf by Arlyce Harman, a trained medical scribe. The creation of this record is based on the scribe's personal observations and the provider's statements to them. This document has been checked and approved by the attending provider.

## 2017-09-28 ENCOUNTER — Encounter: Payer: Self-pay | Admitting: Radiation Oncology

## 2017-09-28 ENCOUNTER — Ambulatory Visit
Admission: RE | Admit: 2017-09-28 | Discharge: 2017-09-28 | Disposition: A | Discharge status: Home / Self Care | Payer: Medicare HMO | Source: Ambulatory Visit | Attending: Radiation Oncology | Admitting: Radiation Oncology

## 2017-09-28 VITALS — BP 134/78 | HR 60 | Temp 97.6°F | Resp 18 | Ht 74.5 in | Wt 196.0 lb

## 2017-09-28 DIAGNOSIS — C4441 Basal cell carcinoma of skin of scalp and neck: Secondary | ICD-10-CM | POA: Insufficient documentation

## 2017-09-28 DIAGNOSIS — C44319 Basal cell carcinoma of skin of other parts of face: Secondary | ICD-10-CM | POA: Insufficient documentation

## 2017-09-28 NOTE — Progress Notes (Signed)
  Radiation Oncology         [phone] ________________________________  Name: Sean Skinner MRN: [id number]  Date of Service: 09/28/2017 DOB: Dec 31, 1940  Post Treatment Note  CC: Lavone Orn, MD  Druscilla Brownie, MD  Diagnosis:  Basal Cell Skin Cancer  Interval Since Last Radiation:  6 weeks   07/08/2017 to 08/17/2017 1. The scalp was treated to 40 Gy in 10 fractions at 4 Gy per fraction.  2. The Left and Right cheek were treated to 28 Gy in 7 fractions at 4 Gy per fraction.   Narrative:  The patient returns today for routine follow-up.  Overall the patient reports he is doing extremely well, he has undergone repeat excision of the lesion on his left forehead, with this procedure. He continues to follow with Dr. Allyson Sabal and Dr. Levada Dy, and has breathing evaluation scheduled for the remainder of the year.                           On review of systems, the patient states he is feeling brain since radiation treatment completed, he reports that this is a smoother contour at this time, and he did not have any skin breakdown. No other complaints or questions are noted.  ALLERGIES:  has No Known Allergies.  Meds: Current Outpatient Prescriptions  Medication Sig Dispense Refill  . fish oil-omega-3 fatty acids 1000 MG capsule Take 2 g by mouth daily.     No current facility-administered medications for this encounter.     Physical Findings:  height is 6' 2.5" (1.892 m) and weight is 196 lb (88.9 kg). His oral temperature is 97.6 F (36.4 C). His blood pressure is 134/78 and his pulse is 60. His respiration is 18 and oxygen saturation is 100%.  Pain Assessment Pain Score: 0-No pain/10 In general this is a well appearing Caucasian male in no acute distress. He's alert and oriented x4 and appropriate throughout the examination. Cardiopulmonary assessment is negative for acute distress and he exhibits normal effort. Right invasive scalp, andbilateral maxillary regions are evaluated,  and no evidence of desquamation is present. There is mild hyperpigmentation assistant with prior. He has a dressing over his left forehead consistent with his most recent procedure. Appears to be healing well though the dressing is not disturbed.  Lab Findings: No results found for: WBC, HGB, HCT, MCV, PLT   Radiographic Findings: No results found.  Impression/Plan: 1. Basal Cell Carcinoma of the Skin. The patient appears to be doing very well post treatment, and will return as needed under the guidance and referral of his dermatologist. He is in agreement with this plan.       Carola Rhine, PAC

## 2017-09-28 NOTE — Addendum Note (Signed)
Encounter addended by: Malena Edman, RN on: 09/28/2017  9:28 AM<BR>    Actions taken: Charge Capture section accepted

## 2017-10-08 DIAGNOSIS — Z1389 Encounter for screening for other disorder: Secondary | ICD-10-CM | POA: Diagnosis not present

## 2017-10-08 DIAGNOSIS — Z0000 Encounter for general adult medical examination without abnormal findings: Secondary | ICD-10-CM | POA: Diagnosis not present

## 2017-10-27 DIAGNOSIS — Z85828 Personal history of other malignant neoplasm of skin: Secondary | ICD-10-CM | POA: Diagnosis not present

## 2017-10-27 DIAGNOSIS — L905 Scar conditions and fibrosis of skin: Secondary | ICD-10-CM | POA: Diagnosis not present

## 2017-10-27 DIAGNOSIS — L570 Actinic keratosis: Secondary | ICD-10-CM | POA: Diagnosis not present

## 2017-11-21 NOTE — Addendum Note (Signed)
Encounter addended by: Kyung Rudd, MD on: 11/21/2017 12:59 PM  Actions taken: Sign clinical note

## 2017-11-21 NOTE — Progress Notes (Signed)
  Radiation Oncology         [phone] ________________________________  Name: Sean Skinner MRN: [id number]  Date: 07/21/2017  DOB: 12-Jul-1941  SIMULATION AND TREATMENT PLANNING NOTE  DIAGNOSIS:     ICD-10-CM   1. Basal cell carcinoma of right forehead C44.319      Site:  curative  NARRATIVE:  The patient was brought to the treatment suite.  Identity was confirmed.  All relevant records and images related to the planned course of therapy were reviewed.   Written consent to proceed with treatment was confirmed which was freely given after reviewing the details related to the planned course of therapy had been reviewed with the patient.  Then, the patient was set-up in a stable reproducible  supine position for radiation therapy.   Surface markings were placed.    Then the target and avoidance structures were contoured.  Treatment planning then occurred.  The radiation prescription was entered and confirmed.  A total of 2 complex treatment devices were fabricated which relate to the designed radiation treatment fields. Each of these customized fields/ complex treatment devices will be used on a daily basis during the radiation course. I have requested : en face electron treatment plans to the 2 separate target regions corresponding to the left and right cheeks.   PLAN:  The patient will receive 28 Gy in 7 fractions, twice weekly.  ________________________________   Jodelle Gross, MD, PhD

## 2017-11-21 NOTE — Progress Notes (Addendum)
  Radiation Oncology         [phone] ________________________________  Name: VASIL JUHASZ MRN: [id number]  Date: 07/06/2017  DOB: 04-Dec-1941  SIMULATION AND TREATMENT PLANNING NOTE  DIAGNOSIS:     ICD-10-CM   1. Basal cell carcinoma of right forehead C44.319      Site:  scalp  NARRATIVE:  The patient was brought to the treatment suite.  Identity was confirmed.  All relevant records and images related to the planned course of therapy were reviewed.   Written consent to proceed with treatment was confirmed which was freely given after reviewing the details related to the planned course of therapy had been reviewed with the patient.  Then, the patient was set-up in a stable reproducible  supine position for radiation therapy. Surface markings were placed.     Then the target and avoidance structures were contoured.  Treatment planning then occurred.  The radiation prescription was entered and confirmed.  A total of 1 complex treatment devices were fabricated which relate to the designed radiation treatment fields. Each of these customized fields/ complex treatment devices will be used on a daily basis during the radiation course. I have requested : en face electron treatment plan.   PLAN:  The patient will receive 40 Gy in 10 fractions.  ________________________________   Jodelle Gross, MD, PhD

## 2018-01-10 DIAGNOSIS — L570 Actinic keratosis: Secondary | ICD-10-CM | POA: Diagnosis not present

## 2018-01-10 DIAGNOSIS — C44622 Squamous cell carcinoma of skin of right upper limb, including shoulder: Secondary | ICD-10-CM | POA: Diagnosis not present

## 2018-01-10 DIAGNOSIS — Z85820 Personal history of malignant melanoma of skin: Secondary | ICD-10-CM | POA: Diagnosis not present

## 2018-01-10 DIAGNOSIS — L905 Scar conditions and fibrosis of skin: Secondary | ICD-10-CM | POA: Diagnosis not present

## 2018-04-18 DIAGNOSIS — Z85828 Personal history of other malignant neoplasm of skin: Secondary | ICD-10-CM | POA: Diagnosis not present

## 2018-04-18 DIAGNOSIS — D485 Neoplasm of uncertain behavior of skin: Secondary | ICD-10-CM | POA: Diagnosis not present

## 2018-04-18 DIAGNOSIS — L89899 Pressure ulcer of other site, unspecified stage: Secondary | ICD-10-CM | POA: Diagnosis not present

## 2018-04-18 DIAGNOSIS — C44321 Squamous cell carcinoma of skin of nose: Secondary | ICD-10-CM | POA: Diagnosis not present

## 2018-04-27 DIAGNOSIS — Z85828 Personal history of other malignant neoplasm of skin: Secondary | ICD-10-CM | POA: Diagnosis not present

## 2018-04-27 DIAGNOSIS — D485 Neoplasm of uncertain behavior of skin: Secondary | ICD-10-CM | POA: Diagnosis not present

## 2018-04-27 DIAGNOSIS — C44612 Basal cell carcinoma of skin of right upper limb, including shoulder: Secondary | ICD-10-CM | POA: Diagnosis not present

## 2018-04-27 DIAGNOSIS — L905 Scar conditions and fibrosis of skin: Secondary | ICD-10-CM | POA: Diagnosis not present

## 2018-04-27 DIAGNOSIS — C44622 Squamous cell carcinoma of skin of right upper limb, including shoulder: Secondary | ICD-10-CM | POA: Diagnosis not present

## 2018-06-29 DIAGNOSIS — C44622 Squamous cell carcinoma of skin of right upper limb, including shoulder: Secondary | ICD-10-CM | POA: Diagnosis not present

## 2018-08-04 DIAGNOSIS — C44612 Basal cell carcinoma of skin of right upper limb, including shoulder: Secondary | ICD-10-CM | POA: Diagnosis not present

## 2018-09-09 DIAGNOSIS — C44319 Basal cell carcinoma of skin of other parts of face: Secondary | ICD-10-CM | POA: Diagnosis not present

## 2018-09-09 DIAGNOSIS — L814 Other melanin hyperpigmentation: Secondary | ICD-10-CM | POA: Diagnosis not present

## 2018-09-09 DIAGNOSIS — D485 Neoplasm of uncertain behavior of skin: Secondary | ICD-10-CM | POA: Diagnosis not present

## 2018-09-09 DIAGNOSIS — L570 Actinic keratosis: Secondary | ICD-10-CM | POA: Diagnosis not present

## 2018-10-10 DIAGNOSIS — Z1389 Encounter for screening for other disorder: Secondary | ICD-10-CM | POA: Diagnosis not present

## 2018-10-10 DIAGNOSIS — Z85820 Personal history of malignant melanoma of skin: Secondary | ICD-10-CM | POA: Diagnosis not present

## 2018-10-10 DIAGNOSIS — Z0000 Encounter for general adult medical examination without abnormal findings: Secondary | ICD-10-CM | POA: Diagnosis not present

## 2018-10-21 DIAGNOSIS — C44629 Squamous cell carcinoma of skin of left upper limb, including shoulder: Secondary | ICD-10-CM | POA: Diagnosis not present

## 2018-10-21 DIAGNOSIS — C4442 Squamous cell carcinoma of skin of scalp and neck: Secondary | ICD-10-CM | POA: Diagnosis not present

## 2018-10-21 DIAGNOSIS — C44622 Squamous cell carcinoma of skin of right upper limb, including shoulder: Secondary | ICD-10-CM | POA: Diagnosis not present

## 2018-10-21 DIAGNOSIS — L578 Other skin changes due to chronic exposure to nonionizing radiation: Secondary | ICD-10-CM | POA: Diagnosis not present

## 2018-10-21 DIAGNOSIS — D485 Neoplasm of uncertain behavior of skin: Secondary | ICD-10-CM | POA: Diagnosis not present

## 2018-11-01 DIAGNOSIS — C44319 Basal cell carcinoma of skin of other parts of face: Secondary | ICD-10-CM | POA: Diagnosis not present

## 2018-11-15 DIAGNOSIS — C4441 Basal cell carcinoma of skin of scalp and neck: Secondary | ICD-10-CM | POA: Diagnosis not present

## 2018-12-12 DIAGNOSIS — D485 Neoplasm of uncertain behavior of skin: Secondary | ICD-10-CM | POA: Diagnosis not present

## 2018-12-12 DIAGNOSIS — S0100XD Unspecified open wound of scalp, subsequent encounter: Secondary | ICD-10-CM | POA: Diagnosis not present

## 2018-12-12 DIAGNOSIS — C44722 Squamous cell carcinoma of skin of right lower limb, including hip: Secondary | ICD-10-CM | POA: Diagnosis not present

## 2018-12-12 DIAGNOSIS — C4442 Squamous cell carcinoma of skin of scalp and neck: Secondary | ICD-10-CM | POA: Diagnosis not present

## 2019-01-02 DIAGNOSIS — C44722 Squamous cell carcinoma of skin of right lower limb, including hip: Secondary | ICD-10-CM | POA: Diagnosis not present

## 2019-01-02 DIAGNOSIS — L570 Actinic keratosis: Secondary | ICD-10-CM | POA: Diagnosis not present

## 2019-01-16 DIAGNOSIS — C44629 Squamous cell carcinoma of skin of left upper limb, including shoulder: Secondary | ICD-10-CM | POA: Diagnosis not present

## 2019-02-06 DIAGNOSIS — C44622 Squamous cell carcinoma of skin of right upper limb, including shoulder: Secondary | ICD-10-CM | POA: Diagnosis not present

## 2019-02-20 DIAGNOSIS — L570 Actinic keratosis: Secondary | ICD-10-CM | POA: Diagnosis not present

## 2019-03-24 DIAGNOSIS — B029 Zoster without complications: Secondary | ICD-10-CM | POA: Diagnosis not present

## 2019-03-24 DIAGNOSIS — B0230 Zoster ocular disease, unspecified: Secondary | ICD-10-CM | POA: Diagnosis not present

## 2019-03-26 DIAGNOSIS — B0239 Other herpes zoster eye disease: Secondary | ICD-10-CM | POA: Diagnosis not present

## 2019-03-28 DIAGNOSIS — B0239 Other herpes zoster eye disease: Secondary | ICD-10-CM | POA: Diagnosis not present

## 2019-03-31 DIAGNOSIS — B0239 Other herpes zoster eye disease: Secondary | ICD-10-CM | POA: Diagnosis not present

## 2019-04-11 DIAGNOSIS — B0239 Other herpes zoster eye disease: Secondary | ICD-10-CM | POA: Diagnosis not present

## 2019-04-21 DIAGNOSIS — B0239 Other herpes zoster eye disease: Secondary | ICD-10-CM | POA: Diagnosis not present

## 2019-05-04 DIAGNOSIS — B0223 Postherpetic polyneuropathy: Secondary | ICD-10-CM | POA: Diagnosis not present

## 2019-05-04 DIAGNOSIS — L570 Actinic keratosis: Secondary | ICD-10-CM | POA: Diagnosis not present

## 2019-05-04 DIAGNOSIS — D485 Neoplasm of uncertain behavior of skin: Secondary | ICD-10-CM | POA: Diagnosis not present

## 2019-05-10 DIAGNOSIS — B0229 Other postherpetic nervous system involvement: Secondary | ICD-10-CM | POA: Diagnosis not present

## 2019-05-10 DIAGNOSIS — R55 Syncope and collapse: Secondary | ICD-10-CM | POA: Diagnosis not present

## 2019-05-10 DIAGNOSIS — H9192 Unspecified hearing loss, left ear: Secondary | ICD-10-CM | POA: Diagnosis not present

## 2019-05-12 DIAGNOSIS — B0239 Other herpes zoster eye disease: Secondary | ICD-10-CM | POA: Diagnosis not present

## 2019-05-30 DIAGNOSIS — H6123 Impacted cerumen, bilateral: Secondary | ICD-10-CM | POA: Diagnosis not present

## 2019-05-30 DIAGNOSIS — B0222 Postherpetic trigeminal neuralgia: Secondary | ICD-10-CM | POA: Diagnosis not present

## 2019-05-30 DIAGNOSIS — Z7289 Other problems related to lifestyle: Secondary | ICD-10-CM | POA: Diagnosis not present

## 2019-05-30 DIAGNOSIS — J343 Hypertrophy of nasal turbinates: Secondary | ICD-10-CM | POA: Diagnosis not present

## 2019-07-12 DIAGNOSIS — R55 Syncope and collapse: Secondary | ICD-10-CM | POA: Diagnosis not present

## 2019-07-12 DIAGNOSIS — H9192 Unspecified hearing loss, left ear: Secondary | ICD-10-CM | POA: Diagnosis not present

## 2019-07-12 DIAGNOSIS — B0229 Other postherpetic nervous system involvement: Secondary | ICD-10-CM | POA: Diagnosis not present

## 2019-09-25 DIAGNOSIS — H40021 Open angle with borderline findings, high risk, right eye: Secondary | ICD-10-CM | POA: Diagnosis not present

## 2019-09-25 DIAGNOSIS — H5340 Unspecified visual field defects: Secondary | ICD-10-CM | POA: Diagnosis not present

## 2019-09-25 DIAGNOSIS — B0239 Other herpes zoster eye disease: Secondary | ICD-10-CM | POA: Diagnosis not present

## 2019-09-25 DIAGNOSIS — H524 Presbyopia: Secondary | ICD-10-CM | POA: Diagnosis not present

## 2019-10-13 DIAGNOSIS — B0239 Other herpes zoster eye disease: Secondary | ICD-10-CM | POA: Diagnosis not present

## 2019-10-19 DIAGNOSIS — C4441 Basal cell carcinoma of skin of scalp and neck: Secondary | ICD-10-CM | POA: Diagnosis not present

## 2019-10-19 DIAGNOSIS — C44712 Basal cell carcinoma of skin of right lower limb, including hip: Secondary | ICD-10-CM | POA: Diagnosis not present

## 2019-10-19 DIAGNOSIS — L570 Actinic keratosis: Secondary | ICD-10-CM | POA: Diagnosis not present

## 2019-10-19 DIAGNOSIS — Z85820 Personal history of malignant melanoma of skin: Secondary | ICD-10-CM | POA: Diagnosis not present

## 2019-10-19 DIAGNOSIS — L578 Other skin changes due to chronic exposure to nonionizing radiation: Secondary | ICD-10-CM | POA: Diagnosis not present

## 2019-10-19 DIAGNOSIS — C44519 Basal cell carcinoma of skin of other part of trunk: Secondary | ICD-10-CM | POA: Diagnosis not present

## 2019-10-19 DIAGNOSIS — Z85828 Personal history of other malignant neoplasm of skin: Secondary | ICD-10-CM | POA: Diagnosis not present

## 2019-10-19 DIAGNOSIS — D485 Neoplasm of uncertain behavior of skin: Secondary | ICD-10-CM | POA: Diagnosis not present

## 2019-11-06 DIAGNOSIS — Z1389 Encounter for screening for other disorder: Secondary | ICD-10-CM | POA: Diagnosis not present

## 2019-11-06 DIAGNOSIS — Z85820 Personal history of malignant melanoma of skin: Secondary | ICD-10-CM | POA: Diagnosis not present

## 2019-11-06 DIAGNOSIS — Z8619 Personal history of other infectious and parasitic diseases: Secondary | ICD-10-CM | POA: Diagnosis not present

## 2019-11-06 DIAGNOSIS — Z0000 Encounter for general adult medical examination without abnormal findings: Secondary | ICD-10-CM | POA: Diagnosis not present

## 2019-11-15 DIAGNOSIS — H9192 Unspecified hearing loss, left ear: Secondary | ICD-10-CM | POA: Diagnosis not present

## 2019-11-15 DIAGNOSIS — R55 Syncope and collapse: Secondary | ICD-10-CM | POA: Diagnosis not present

## 2019-11-15 DIAGNOSIS — B0229 Other postherpetic nervous system involvement: Secondary | ICD-10-CM | POA: Diagnosis not present

## 2019-11-20 DIAGNOSIS — D044 Carcinoma in situ of skin of scalp and neck: Secondary | ICD-10-CM | POA: Diagnosis not present

## 2019-12-08 DIAGNOSIS — B0239 Other herpes zoster eye disease: Secondary | ICD-10-CM | POA: Diagnosis not present

## 2019-12-28 DIAGNOSIS — C44712 Basal cell carcinoma of skin of right lower limb, including hip: Secondary | ICD-10-CM | POA: Diagnosis not present

## 2020-01-11 DIAGNOSIS — D044 Carcinoma in situ of skin of scalp and neck: Secondary | ICD-10-CM | POA: Diagnosis not present

## 2020-01-11 DIAGNOSIS — L0889 Other specified local infections of the skin and subcutaneous tissue: Secondary | ICD-10-CM | POA: Diagnosis not present

## 2020-01-11 DIAGNOSIS — D485 Neoplasm of uncertain behavior of skin: Secondary | ICD-10-CM | POA: Diagnosis not present

## 2020-01-11 DIAGNOSIS — C44612 Basal cell carcinoma of skin of right upper limb, including shoulder: Secondary | ICD-10-CM | POA: Diagnosis not present

## 2020-01-11 DIAGNOSIS — L089 Local infection of the skin and subcutaneous tissue, unspecified: Secondary | ICD-10-CM | POA: Diagnosis not present

## 2020-01-11 DIAGNOSIS — C44519 Basal cell carcinoma of skin of other part of trunk: Secondary | ICD-10-CM | POA: Diagnosis not present

## 2020-01-11 DIAGNOSIS — C4441 Basal cell carcinoma of skin of scalp and neck: Secondary | ICD-10-CM | POA: Diagnosis not present

## 2020-01-19 ENCOUNTER — Encounter: Payer: Self-pay | Admitting: *Deleted

## 2020-01-22 DIAGNOSIS — C44619 Basal cell carcinoma of skin of left upper limb, including shoulder: Secondary | ICD-10-CM | POA: Diagnosis not present

## 2020-01-22 DIAGNOSIS — L905 Scar conditions and fibrosis of skin: Secondary | ICD-10-CM | POA: Diagnosis not present

## 2020-01-22 DIAGNOSIS — Z85820 Personal history of malignant melanoma of skin: Secondary | ICD-10-CM | POA: Diagnosis not present

## 2020-01-22 DIAGNOSIS — L929 Granulomatous disorder of the skin and subcutaneous tissue, unspecified: Secondary | ICD-10-CM | POA: Diagnosis not present

## 2020-01-22 DIAGNOSIS — Z85828 Personal history of other malignant neoplasm of skin: Secondary | ICD-10-CM | POA: Diagnosis not present

## 2020-01-22 DIAGNOSIS — C44622 Squamous cell carcinoma of skin of right upper limb, including shoulder: Secondary | ICD-10-CM | POA: Diagnosis not present

## 2020-01-22 DIAGNOSIS — C44629 Squamous cell carcinoma of skin of left upper limb, including shoulder: Secondary | ICD-10-CM | POA: Diagnosis not present

## 2020-01-22 DIAGNOSIS — D485 Neoplasm of uncertain behavior of skin: Secondary | ICD-10-CM | POA: Diagnosis not present

## 2020-01-25 ENCOUNTER — Ambulatory Visit
Admission: RE | Admit: 2020-01-25 | Discharge: 2020-01-25 | Disposition: A | Discharge status: Home / Self Care | Payer: Medicare HMO | Source: Ambulatory Visit | Attending: Radiation Oncology | Admitting: Radiation Oncology

## 2020-01-25 ENCOUNTER — Encounter: Payer: Self-pay | Admitting: Radiation Oncology

## 2020-01-25 ENCOUNTER — Other Ambulatory Visit: Payer: Self-pay

## 2020-01-25 DIAGNOSIS — C4441 Basal cell carcinoma of skin of scalp and neck: Secondary | ICD-10-CM | POA: Insufficient documentation

## 2020-01-25 DIAGNOSIS — C44319 Basal cell carcinoma of skin of other parts of face: Secondary | ICD-10-CM

## 2020-01-25 DIAGNOSIS — Z923 Personal history of irradiation: Secondary | ICD-10-CM | POA: Diagnosis not present

## 2020-01-25 DIAGNOSIS — Z85828 Personal history of other malignant neoplasm of skin: Secondary | ICD-10-CM | POA: Diagnosis not present

## 2020-01-25 NOTE — Progress Notes (Addendum)
Radiation Oncology         [phone] ________________________________  Reconsultation - Conducted via telephone due to current COVID-19 concerns for limiting patient exposure  I spoke with the patient to conduct this consult visit via telephone to spare the patient unnecessary potential exposure in the healthcare setting during the current COVID-19 pandemic. The patient was notified in advance and was offered a [place] meeting to allow for face to face communication but unfortunately reported that they did not have the appropriate resources/technology to support such a visit and instead preferred to proceed with a telephone visit.  ________________________________  Name: Sean Skinner MRN: [id number]  Date of Service: 01/25/2020 DOB: May 07, 1941  Post Treatment Note  CC: Lavone Orn, MD  Karin Golden, MD  Diagnosis:  Basal Cell Skin Cancer  Interval Since Last Radiation: 2 years, 5 months  07/08/2017 to 08/17/2017 1. The scalp was treated to 40 Gy in 10 fractions at 4 Gy per fraction.  2. The Left and Right cheek were treated to 28 Gy in 7 fractions at 4 Gy per fraction.   Narrative:  In summary this is a pleasant 79 y.o. gentleman with a history of multifocal basal cell carcinoma involving the skin of the face and scalp, as well as the extremities, truck and upper back. He was treated with radiotherapy to the scalp, and bilateral cheeks in 2018. He has been followed closely at [facility], but since Dr. Allyson Sabal retired has continued to be followed at the [facility]. Dr. Danielle Dess and Dr. Winifred Olive have followed this patient and he had lesions resected last fall of the thigh, left neck, right scalp and upper back. More recently on 01/01/20 he had surgical resection by Dr. Winifred Olive of the left upper back, right forearm, and mohs surgery of the left neck. His histology remains basal cell carcinoma. The surgical specimen from his left neck mohs procedure revealed concerns for perineural  invasion measuring .1 mm. Because of these high risk findings, he's contacted to discuss further radiotherapy to the posterior left neck. Of note the patient states he is planning on mohs procedure also for the upper back and right forearm.   On review of systems, the patient states he is doing well. He is healing up nicely and states he is due back for his visit with Dr. Winifred Olive on 02/22/20 for his moh's of the upper back, then on 03/21/20 for his arm. No other concerns or complaints are verbalized.  PAST MEDICAL HISTORY:  Past Medical History:  Diagnosis Date  . Basal cell cancer    face,arms,neck skin    PAST SURGICAL HISTORY: Past Surgical History:  Procedure Laterality Date  . COLONOSCOPY WITH PROPOFOL N/A 03/13/2013   Procedure: COLONOSCOPY WITH PROPOFOL;  Surgeon: Garlan Fair, MD;  Location: [facility];  Service: Endoscopy;  Laterality: N/A;  . Removal of skin cancer      PAST SOCIAL HISTORY:  Social History   Socioeconomic History  . Marital status: Married    Spouse name: Not on file  . Number of children: Not on file  . Years of education: Not on file  . Highest education level: Not on file  Occupational History  . Not on file  Tobacco Use  . Smoking status: Never Smoker  . Smokeless tobacco: Former Network engineer and Sexual Activity  . Alcohol use: Yes  . Drug use: No  . Sexual activity: Not on file  Other Topics Concern  . Not on file  Social  History Narrative  . Not on file   Social Determinants of Health   Financial Resource Strain:   . Difficulty of Paying Living Expenses: Not on file  Food Insecurity:   . Worried About Charity fundraiser in the Last Year: Not on file  . Ran Out of Food in the Last Year: Not on file  Transportation Needs:   . Lack of Transportation (Medical): Not on file  . Lack of Transportation (Non-Medical): Not on file  Physical Activity:   . Days of Exercise per Week: Not on file  . Minutes of Exercise per Session: Not on  file  Stress:   . Feeling of Stress : Not on file  Social Connections:   . Frequency of Communication with Friends and Family: Not on file  . Frequency of Social Gatherings with Friends and Family: Not on file  . Attends Religious Services: Not on file  . Active Member of Clubs or Organizations: Not on file  . Attends Archivist Meetings: Not on file  . Marital Status: Not on file  Intimate Partner Violence:   . Fear of Current or Ex-Partner: Not on file  . Emotionally Abused: Not on file  . Physically Abused: Not on file  . Sexually Abused: Not on file  The patient is married and lives in [place].    PAST FAMILY HISTORY:History reviewed. No pertinent family history.  MEDICATIONS  Current Outpatient Medications  Medication Sig Dispense Refill  . gabapentin (NEURONTIN) 300 MG capsule Take by mouth.    Marland Kitchen amoxicillin-clavulanate (AUGMENTIN) 875-125 MG tablet Add'l Sig Add'l Sig oral Add'l Sig    . doxycycline (VIBRAMYCIN) 100 MG capsule Take 100 mg by mouth 2 (two) times daily.    Marland Kitchen EFUDEX 5 % cream 1 application apply on the skin twice a day  apply on the skin twice a day x 3 weeks    . fish oil-omega-3 fatty acids 1000 MG capsule Take 2 g by mouth daily.    Marland Kitchen gabapentin (NEURONTIN) 300 MG capsule     . prednisoLONE acetate (PRED FORTE) 1 % ophthalmic suspension     . triamcinolone ointment (KENALOG) 0.1 % Apply      small amount to affected area on scalp x 1 month    . valACYclovir (VALTREX) 1000 MG tablet      No current facility-administered medications for this encounter.    ALLERGIES: No Known Allergies  Physical Findings: unable to assess due to encounter type.  Lab Findings: No results found for: WBC, HGB, HCT, MCV, PLT   Radiographic Findings: No results found.  Impression/Plan: 1. Multifocal Basal Cell Carcinoma of the Skin, with a high risk resected site of the left neck. Dr. Lisbeth Renshaw discusses the pathology findings and reviews the nature of basal  cell carcinomas and details the features on pathology with perineural invasion that are high risk for recurrence. We are awaiting further clarity on the site in question from Dr. Hubbard Hartshorn office and would be happy to  We discussed the risks, benefits, short, and long term effects of radiotherapy, and the patient is interested in proceeding. Dr. Lisbeth Renshaw discusses the delivery and logistics of radiotherapy and anticipates a course of less than 2 weeks of radiotherapy. Once we review his images from Dr. Winifred Olive and have a chance to discuss this further, we will contact the patient to coordinate next steps.    Given current concerns for patient exposure during the COVID-19 pandemic, this encounter was conducted via telephone.  The patient has given verbal consent for this type of encounter. The time spent during this encounter was 30 minutes and 50% of that time was spent in the coordination of his care. The attendants for this meeting include Dr. Lisbeth Renshaw, Shona Simpson, [facility] and Toy Baker.  During the encounter, Dr. Lisbeth Renshaw and Shona Simpson [facility] were located at [facility] Radiation Oncology Department.  Cheral Marker Mally  was located at home.   The above documentation reflects my direct findings during this shared patient visit. Please see the separate note by Dr. Lisbeth Renshaw on this date for the remainder of the patient's plan of care.      Addendum: I was able to speak with Dr. Winifred Olive and he sent me photos of the postoperative area prior to and following primary closure. The are in yellow is the site with PNI.   Dr. Lisbeth Renshaw is going to evaluate this and we will contact him in the near future to schedule his planning appointment at the treatment machine.     Carola Rhine, PAC

## 2020-02-22 DIAGNOSIS — C44519 Basal cell carcinoma of skin of other part of trunk: Secondary | ICD-10-CM | POA: Diagnosis not present

## 2020-03-12 DIAGNOSIS — B0239 Other herpes zoster eye disease: Secondary | ICD-10-CM | POA: Diagnosis not present

## 2020-03-12 DIAGNOSIS — H52203 Unspecified astigmatism, bilateral: Secondary | ICD-10-CM | POA: Diagnosis not present

## 2020-03-12 DIAGNOSIS — H2513 Age-related nuclear cataract, bilateral: Secondary | ICD-10-CM | POA: Diagnosis not present

## 2020-03-13 DIAGNOSIS — H9192 Unspecified hearing loss, left ear: Secondary | ICD-10-CM | POA: Diagnosis not present

## 2020-03-13 DIAGNOSIS — R55 Syncope and collapse: Secondary | ICD-10-CM | POA: Diagnosis not present

## 2020-03-13 DIAGNOSIS — B0229 Other postherpetic nervous system involvement: Secondary | ICD-10-CM | POA: Diagnosis not present

## 2020-03-14 ENCOUNTER — Ambulatory Visit: Payer: Medicare HMO | Attending: Internal Medicine

## 2020-03-14 DIAGNOSIS — Z23 Encounter for immunization: Secondary | ICD-10-CM

## 2020-03-14 NOTE — Progress Notes (Signed)
   Covid-19 Vaccination Clinic  Name:  JAKADEN BUICE    MRN: [id number] DOB: [birth year]  03/14/2020  Mr. Shadd was observed post Covid-19 immunization for 15 minutes without incident. He was provided with Vaccine Information Sheet and instruction to access the V-Safe system.   Mr. Pollok was instructed to call 911 with any severe reactions post vaccine: Marland Kitchen Difficulty breathing  . Swelling of face and throat  . A fast heartbeat  . A bad rash all over body  . Dizziness and weakness   Immunizations Administered    Name Date Dose VIS Date Route   Pfizer COVID-19 Vaccine 03/14/2020 11:43 AM 0.3 mL 12/08/2019 Intramuscular   Manufacturer: [place]   Lot: [id number]   [place]: [id number]

## 2020-03-21 ENCOUNTER — Telehealth: Payer: Self-pay | Admitting: Radiation Oncology

## 2020-03-21 DIAGNOSIS — C44612 Basal cell carcinoma of skin of right upper limb, including shoulder: Secondary | ICD-10-CM | POA: Diagnosis not present

## 2020-03-21 DIAGNOSIS — D485 Neoplasm of uncertain behavior of skin: Secondary | ICD-10-CM | POA: Diagnosis not present

## 2020-03-21 DIAGNOSIS — C4442 Squamous cell carcinoma of skin of scalp and neck: Secondary | ICD-10-CM | POA: Diagnosis not present

## 2020-03-21 NOTE — Telephone Encounter (Signed)
I called to follow up with the patient to see if he was ready to schedule treatment to his posterior neck following his moh's procedure. He wishes to hold off on scheduling anything as he wishes to complete any further moh's surgery with Dr. Winifred Olive. He's scheduled for his next surgery on 04/04/20. I will follow up with him a few days later to see how he'd like to proceed.

## 2020-04-04 DIAGNOSIS — C44619 Basal cell carcinoma of skin of left upper limb, including shoulder: Secondary | ICD-10-CM | POA: Diagnosis not present

## 2020-04-04 DIAGNOSIS — C44519 Basal cell carcinoma of skin of other part of trunk: Secondary | ICD-10-CM | POA: Diagnosis not present

## 2020-04-04 DIAGNOSIS — C44629 Squamous cell carcinoma of skin of left upper limb, including shoulder: Secondary | ICD-10-CM | POA: Diagnosis not present

## 2020-04-08 ENCOUNTER — Ambulatory Visit: Payer: Medicare HMO | Attending: Internal Medicine

## 2020-04-08 DIAGNOSIS — Z23 Encounter for immunization: Secondary | ICD-10-CM

## 2020-04-08 NOTE — Progress Notes (Signed)
   Covid-19 Vaccination Clinic  Name:  RAYLAN HEADINGS    MRN: [id number] DOB: [birth year]  04/08/2020  Mr. Distler was observed post Covid-19 immunization for 15 minutes without incident. He was provided with Vaccine Information Sheet and instruction to access the V-Safe system.   Mr. Burningham was instructed to call 911 with any severe reactions post vaccine: Marland Kitchen Difficulty breathing  . Swelling of face and throat  . A fast heartbeat  . A bad rash all over body  . Dizziness and weakness   Immunizations Administered    Name Date Dose VIS Date Route   Pfizer COVID-19 Vaccine 04/08/2020  9:39 AM 0.3 mL 12/08/2019 Intramuscular   Manufacturer: [place]   Lot: [id number]   [place]: [id number]

## 2020-04-18 ENCOUNTER — Telehealth: Payer: Self-pay | Admitting: Radiation Oncology

## 2020-04-18 NOTE — Telephone Encounter (Signed)
The patient has postponed radiotherapy to his neck as he has other Mohs procedures coming up in a few weeks. I will follow up with him after his next surgery on 05/08/20 to see if he is ready to proceed.

## 2020-05-01 DIAGNOSIS — H01001 Unspecified blepharitis right upper eyelid: Secondary | ICD-10-CM | POA: Diagnosis not present

## 2020-05-01 DIAGNOSIS — H01004 Unspecified blepharitis left upper eyelid: Secondary | ICD-10-CM | POA: Diagnosis not present

## 2020-05-06 DIAGNOSIS — C44529 Squamous cell carcinoma of skin of other part of trunk: Secondary | ICD-10-CM | POA: Diagnosis not present

## 2020-05-06 DIAGNOSIS — D485 Neoplasm of uncertain behavior of skin: Secondary | ICD-10-CM | POA: Diagnosis not present

## 2020-05-06 DIAGNOSIS — C4442 Squamous cell carcinoma of skin of scalp and neck: Secondary | ICD-10-CM | POA: Diagnosis not present

## 2020-05-13 ENCOUNTER — Telehealth: Payer: Self-pay | Admitting: Radiation Oncology

## 2020-05-13 DIAGNOSIS — C4442 Squamous cell carcinoma of skin of scalp and neck: Secondary | ICD-10-CM | POA: Diagnosis not present

## 2020-05-13 DIAGNOSIS — C44529 Squamous cell carcinoma of skin of other part of trunk: Secondary | ICD-10-CM | POA: Diagnosis not present

## 2020-05-13 NOTE — Telephone Encounter (Signed)
I called the patient to see how his Moh's procedure went last week and to see how he was doing. He had another area treated on 5/12 that is on the forehead and he is under the impression it needs xrt as well. He will see Dr. Winifred Olive today and I've sent him an email asking about his thoughts on healing and need for further treatment to this site as well as the posterior neck.

## 2020-05-16 ENCOUNTER — Institutional Professional Consult (permissible substitution): Payer: Self-pay | Admitting: Radiation Oncology

## 2020-06-07 DIAGNOSIS — C44622 Squamous cell carcinoma of skin of right upper limb, including shoulder: Secondary | ICD-10-CM | POA: Diagnosis not present

## 2020-06-07 DIAGNOSIS — B0239 Other herpes zoster eye disease: Secondary | ICD-10-CM | POA: Diagnosis not present

## 2020-06-11 DIAGNOSIS — B0239 Other herpes zoster eye disease: Secondary | ICD-10-CM | POA: Diagnosis not present

## 2020-06-14 ENCOUNTER — Telehealth: Payer: Self-pay | Admitting: *Deleted

## 2020-06-14 NOTE — Telephone Encounter (Signed)
Patient is requesting to cancel appointment for 06/18/20 until the area has healed and sees the plastic surgeon on 06/21/20. He said he will call back to reschedule.

## 2020-06-18 ENCOUNTER — Institutional Professional Consult (permissible substitution): Payer: Self-pay | Admitting: Radiation Oncology

## 2020-06-18 DIAGNOSIS — H02055 Trichiasis without entropian left lower eyelid: Secondary | ICD-10-CM | POA: Diagnosis not present

## 2020-06-21 ENCOUNTER — Encounter: Payer: Self-pay | Admitting: Plastic Surgery

## 2020-06-21 ENCOUNTER — Ambulatory Visit: Payer: Medicare HMO | Admitting: Plastic Surgery

## 2020-06-21 ENCOUNTER — Other Ambulatory Visit: Payer: Self-pay

## 2020-06-21 VITALS — BP 117/72 | HR 78 | Temp 98.0°F | Ht 74.0 in | Wt 196.4 lb

## 2020-06-21 DIAGNOSIS — S0100XA Unspecified open wound of scalp, initial encounter: Secondary | ICD-10-CM | POA: Diagnosis not present

## 2020-06-21 NOTE — Progress Notes (Signed)
Referring Provider Lavone Orn, MD [address] [place],  [place] [address]   CC:  Chief Complaint  Patient presents with  . Consult      Sean Skinner is an 79 y.o. male.  HPI: Patient presents with a scalp wound after Mohs excision.  This had some perineural invasion and there is a plan for radiation when he is healed.  He has had many skin cancer excisions and reconstructions in the past and many on his scalp adjacent to this area and forehead.  This most recent excision was fairly large and was partially closed and the rest was left to granulate.  He did end up with some exposed skull but he says this is gradually getting smaller and smaller as the granulation tissue progresses.  No Known Allergies  Outpatient Encounter Medications as of 06/21/2020  Medication Sig  . amoxicillin-clavulanate (AUGMENTIN) 875-125 MG tablet Add'l Sig Add'l Sig oral Add'l Sig  . doxycycline (VIBRAMYCIN) 100 MG capsule Take 100 mg by mouth 2 (two) times daily.  Marland Kitchen EFUDEX 5 % cream 1 application apply on the skin twice a day  apply on the skin twice a day x 3 weeks  . fish oil-omega-3 fatty acids 1000 MG capsule Take 2 g by mouth daily.  Marland Kitchen gabapentin (NEURONTIN) 300 MG capsule   . gabapentin (NEURONTIN) 300 MG capsule Take by mouth.  . prednisoLONE acetate (PRED FORTE) 1 % ophthalmic suspension   . triamcinolone ointment (KENALOG) 0.1 % Apply      small amount to affected area on scalp x 1 month  . valACYclovir (VALTREX) 1000 MG tablet    No facility-administered encounter medications on file as of 06/21/2020.     Past Medical History:  Diagnosis Date  . Basal cell cancer    face,arms,neck skin    Past Surgical History:  Procedure Laterality Date  . COLONOSCOPY WITH PROPOFOL N/A 03/13/2013   Procedure: COLONOSCOPY WITH PROPOFOL;  Surgeon: Garlan Fair, MD;  Location: [facility];  Service: Endoscopy;  Laterality: N/A;  . Removal of skin cancer      No family history on  file.  Social History   Social History Narrative  . Not on file     Review of Systems General: Denies fevers, chills, weight loss CV: Denies chest pain, shortness of breath, palpitations  Physical Exam Vitals with BMI 06/21/2020 09/28/2017 06/24/2017  Height 6\' 2"  6' 2.5" 6' 2.5"  Weight 196 lbs 6 oz 196 lbs 197 lbs 13 oz  BMI 25.21 17.40 81.4  Systolic 481 856 314  Diastolic 72 78 80  Pulse 78 60 81    General:  No acute distress,  Alert and oriented, Non-Toxic, Normal speech and affect Examination of the scalp shows an open wound towards the right side in the frontal/parietal area.  There is about 2 to 3 cm of exposed skull at this point.  The remainder appears healthy with granulation tissue.  There is scattered scarring throughout the scalp and forehead from previous excisions.  He has multiple other suspicious lesions on the face and other areas of his body that could be skin cancers as well.  Assessment/Plan Patient presents with a complex wound of the scalp with exposed skull.  From my standpoint his 2 options are either continuing with conservative wound care or placing Integra over the wound.  I explained the pros and cons of each approach.  At this point he wants to continue with conservative wound care.  I would expect this would be healed for him in 4 to 6 weeks.  I did explain that the Integra coverage might be a little bit more durable than the epithelialization that would happen over the skull but he would rather continue with wound care for now and do the Integra later if necessary.  I think this is reasonable.  I will plan to see him back on an as-needed basis at his request.  Sean Skinner 06/21/2020, 4:02 PM

## 2020-06-25 DIAGNOSIS — B0239 Other herpes zoster eye disease: Secondary | ICD-10-CM | POA: Diagnosis not present

## 2020-06-28 DIAGNOSIS — C44619 Basal cell carcinoma of skin of left upper limb, including shoulder: Secondary | ICD-10-CM | POA: Diagnosis not present

## 2020-07-05 DIAGNOSIS — B0239 Other herpes zoster eye disease: Secondary | ICD-10-CM | POA: Diagnosis not present

## 2020-07-08 DIAGNOSIS — C44521 Squamous cell carcinoma of skin of breast: Secondary | ICD-10-CM | POA: Diagnosis not present

## 2020-07-08 DIAGNOSIS — D485 Neoplasm of uncertain behavior of skin: Secondary | ICD-10-CM | POA: Diagnosis not present

## 2020-07-08 DIAGNOSIS — C44519 Basal cell carcinoma of skin of other part of trunk: Secondary | ICD-10-CM | POA: Diagnosis not present

## 2020-07-08 DIAGNOSIS — C44529 Squamous cell carcinoma of skin of other part of trunk: Secondary | ICD-10-CM | POA: Diagnosis not present

## 2020-08-02 DIAGNOSIS — D044 Carcinoma in situ of skin of scalp and neck: Secondary | ICD-10-CM | POA: Diagnosis not present

## 2020-08-02 DIAGNOSIS — C4441 Basal cell carcinoma of skin of scalp and neck: Secondary | ICD-10-CM | POA: Diagnosis not present

## 2020-08-02 DIAGNOSIS — C44511 Basal cell carcinoma of skin of breast: Secondary | ICD-10-CM | POA: Diagnosis not present

## 2020-08-02 DIAGNOSIS — D485 Neoplasm of uncertain behavior of skin: Secondary | ICD-10-CM | POA: Diagnosis not present

## 2020-08-14 DIAGNOSIS — B0239 Other herpes zoster eye disease: Secondary | ICD-10-CM | POA: Diagnosis not present

## 2020-08-14 DIAGNOSIS — H02055 Trichiasis without entropian left lower eyelid: Secondary | ICD-10-CM | POA: Diagnosis not present

## 2020-08-16 DIAGNOSIS — D044 Carcinoma in situ of skin of scalp and neck: Secondary | ICD-10-CM | POA: Diagnosis not present

## 2020-09-03 DIAGNOSIS — C4441 Basal cell carcinoma of skin of scalp and neck: Secondary | ICD-10-CM | POA: Diagnosis not present

## 2020-10-04 DIAGNOSIS — L2084 Intrinsic (allergic) eczema: Secondary | ICD-10-CM | POA: Diagnosis not present

## 2020-10-04 DIAGNOSIS — C44229 Squamous cell carcinoma of skin of left ear and external auricular canal: Secondary | ICD-10-CM | POA: Diagnosis not present

## 2020-10-04 DIAGNOSIS — L570 Actinic keratosis: Secondary | ICD-10-CM | POA: Diagnosis not present

## 2020-10-04 DIAGNOSIS — L905 Scar conditions and fibrosis of skin: Secondary | ICD-10-CM | POA: Diagnosis not present

## 2020-10-04 DIAGNOSIS — D485 Neoplasm of uncertain behavior of skin: Secondary | ICD-10-CM | POA: Diagnosis not present

## 2020-10-04 DIAGNOSIS — Z85820 Personal history of malignant melanoma of skin: Secondary | ICD-10-CM | POA: Diagnosis not present

## 2020-10-04 DIAGNOSIS — C44329 Squamous cell carcinoma of skin of other parts of face: Secondary | ICD-10-CM | POA: Diagnosis not present

## 2020-10-04 DIAGNOSIS — Z85828 Personal history of other malignant neoplasm of skin: Secondary | ICD-10-CM | POA: Diagnosis not present

## 2020-10-22 DIAGNOSIS — C44329 Squamous cell carcinoma of skin of other parts of face: Secondary | ICD-10-CM | POA: Diagnosis not present

## 2020-10-22 DIAGNOSIS — D485 Neoplasm of uncertain behavior of skin: Secondary | ICD-10-CM | POA: Diagnosis not present

## 2020-10-22 DIAGNOSIS — C44229 Squamous cell carcinoma of skin of left ear and external auricular canal: Secondary | ICD-10-CM | POA: Diagnosis not present

## 2020-10-22 DIAGNOSIS — C4442 Squamous cell carcinoma of skin of scalp and neck: Secondary | ICD-10-CM | POA: Diagnosis not present

## 2020-10-22 DIAGNOSIS — D0439 Carcinoma in situ of skin of other parts of face: Secondary | ICD-10-CM | POA: Diagnosis not present

## 2020-10-23 DIAGNOSIS — B0239 Other herpes zoster eye disease: Secondary | ICD-10-CM | POA: Diagnosis not present

## 2020-10-30 ENCOUNTER — Encounter: Payer: Self-pay | Admitting: Radiation Oncology

## 2020-10-31 ENCOUNTER — Other Ambulatory Visit: Payer: Self-pay

## 2020-10-31 ENCOUNTER — Ambulatory Visit
Admission: RE | Admit: 2020-10-31 | Discharge: 2020-10-31 | Disposition: A | Discharge status: Home / Self Care | Payer: Medicare HMO | Source: Ambulatory Visit | Attending: Radiation Oncology | Admitting: Radiation Oncology

## 2020-10-31 DIAGNOSIS — C44329 Squamous cell carcinoma of skin of other parts of face: Secondary | ICD-10-CM | POA: Diagnosis not present

## 2020-10-31 DIAGNOSIS — C4441 Basal cell carcinoma of skin of scalp and neck: Secondary | ICD-10-CM | POA: Diagnosis not present

## 2020-10-31 DIAGNOSIS — Z923 Personal history of irradiation: Secondary | ICD-10-CM | POA: Diagnosis not present

## 2020-10-31 DIAGNOSIS — C44319 Basal cell carcinoma of skin of other parts of face: Secondary | ICD-10-CM

## 2020-10-31 DIAGNOSIS — Z85828 Personal history of other malignant neoplasm of skin: Secondary | ICD-10-CM | POA: Diagnosis not present

## 2020-10-31 NOTE — Addendum Note (Signed)
Encounter addended by: Hayden Pedro, PA-C on: 10/31/2020 12:36 PM  Actions taken: Clinical Note Signed, Level of Service modified

## 2020-10-31 NOTE — Progress Notes (Signed)
Radiation Oncology         [phone] ________________________________  Reconsultation - Conducted via telephone due to current COVID-19 concerns for limiting patient exposure  I spoke with the patient to conduct this consult visit via telephone to spare the patient unnecessary potential exposure in the healthcare setting during the current COVID-19 pandemic. The patient was notified in advance and was offered a [place] meeting to allow for face to face communication but unfortunately reported that they did not have the appropriate resources/technology to support such a visit and instead preferred to proceed with a telephone visit.  ________________________________  Name: Sean Skinner MRN: [id number]  Date of Service: 10/31/2020 DOB: 04/19/1941  CC: Lavone Orn, MD  Karin Golden, MD  Diagnosis: Multifocal Skin cancer  Interval Since Last Radiation: 3 years, 4 months  07/08/2017 to 08/17/2017 1. The scalp was treated to 40 Gy in 10 fractions at 4 Gy per fraction.  2. The Left and Right cheek were treated to 28 Gy in 7 fractions at 4 Gy per fraction.   Narrative:  In summary this is a pleasant 79 y.o. gentleman with a history of multifocal basal cell carcinoma involving the skin of the face and scalp, as well as the extremities, truck and upper back. He was treated with radiotherapy to the scalp, and bilateral cheeks in 2018. He has been followed closely at [facility], but since Dr. Allyson Sabal retired has continued to be followed at the [facility]. Dr. Danielle Dess and Dr. Winifred Olive have followed this patient and he had lesions resecte of the thigh, left neck, right scalp and upper back. On 01/01/20 he had surgical resection by Dr. Winifred Olive of the left upper back, right forearm, and mohs surgery of the left neck. His histology remains basal cell carcinoma. The surgical specimen from his left neck mohs procedure revealed concerns for perineural invasion measuring .1 mm. Because of these high  risk findings, he's contacted to discuss further radiotherapy to the posterior left neck. In the midst of getting ready to treat his left neck, the patient developed several other lesions that required surgical attention. Mainly he underwent a significant Mohs procedure to his scalp as well as excision of the lesion on his left chest. This was performed on 05/06/2020. The left chest lesion had superficial invasive squamous cell carcinoma extending to the deep margin, and most remnant on the right frontal scalp revealed an invasive squamous cell carcinoma with perineural invasion maximum nerve diameter of .6 mm. He was initially planning to have a graft to repair the defect which was quite substantial however insurance would not cover this and so primary healing occurred. He has now completed this however recent evaluation with Dr. Winifred Olive revealed 2 new areas in the left ear and right forehead. Shave biopsy of each of these sites revealed invasive squamous cell carcinoma along the crus of the helix, squamous cell carcinoma in situ on ulcerated in the left preauricular specimen and right forehead superficially invasive squamous cell carcinoma. He returned for further evaluation and on 10/22/2020 a second shave biopsy along the right scalp lesion more parietal region was consistent with superficially invasive squamous cell carcinoma extending to the deep margin. I have spoken with Dr. Winifred Olive and he recommends adjuvant radiotherapy to the scalp site including the most recently noted lesion just adjacent as well as to the region of his left ear as the patient has declined Mohs surgery in that location.   Images prior to the most recent biopsies in  the patient's face are seen below:   This is the site above looked like before it healed below, green is areas of perineural invasion.   This is the left helical site   On review of systems, the patient states he is doing well. He is healing up nicely and states he  is doing well overall. He denies any bleeding or crusting from his biopsy sites. He denies any pain or any other new symptoms or lesions of concern.  PAST MEDICAL HISTORY:  Past Medical History:  Diagnosis Date   Basal cell cancer    face,arms,neck skin    PAST SURGICAL HISTORY: Past Surgical History:  Procedure Laterality Date   COLONOSCOPY WITH PROPOFOL N/A 03/13/2013   Procedure: COLONOSCOPY WITH PROPOFOL;  Surgeon: Garlan Fair, MD;  Location: [facility];  Service: Endoscopy;  Laterality: N/A;   Removal of skin cancer      PAST SOCIAL HISTORY:  Social History   Socioeconomic History   Marital status: Married    Spouse name: Not on file   Number of children: Not on file   Years of education: Not on file   Highest education level: Not on file  Occupational History   Not on file  Tobacco Use   Smoking status: Never Smoker   Smokeless tobacco: Former Systems developer  Substance and Sexual Activity   Alcohol use: Yes   Drug use: No   Sexual activity: Not on file  Other Topics Concern   Not on file  Social History Narrative   Not on file   Social Determinants of Health   Financial Resource Strain:    Difficulty of Paying Living Expenses: Not on file  Food Insecurity:    Worried About Charity fundraiser in the Last Year: Not on file   [employer] of Food in the Last Year: Not on file  Transportation Needs:    Lack of Transportation (Medical): Not on file   Lack of Transportation (Non-Medical): Not on file  Physical Activity:    Days of Exercise per Week: Not on file   Minutes of Exercise per Session: Not on file  Stress:    Feeling of Stress : Not on file  Social Connections:    Frequency of Communication with Friends and Family: Not on file   Frequency of Social Gatherings with Friends and Family: Not on file   Attends Religious Services: Not on file   Active Member of Clubs or Organizations: Not on file   Attends Archivist  Meetings: Not on file   Marital Status: Not on file  Intimate Partner Violence:    Fear of Current or Ex-Partner: Not on file   Emotionally Abused: Not on file   Physically Abused: Not on file   Sexually Abused: Not on file  The patient is married and lives in [place].    PAST FAMILY HISTORY:No family history on file.  MEDICATIONS  Current Outpatient Medications  Medication Sig Dispense Refill   valACYclovir (VALTREX) 1000 MG tablet      amoxicillin-clavulanate (AUGMENTIN) 875-125 MG tablet Add'l Sig Add'l Sig oral Add'l Sig (Patient not taking: Reported on 10/30/2020)     doxycycline (VIBRAMYCIN) 100 MG capsule Take 100 mg by mouth 2 (two) times daily. (Patient not taking: Reported on 10/30/2020)     EFUDEX 5 % cream 1 application apply on the skin twice a day  apply on the skin twice a day x 3 weeks (Patient not taking: Reported on 10/30/2020)  fish oil-omega-3 fatty acids 1000 MG capsule Take 2 g by mouth daily. (Patient not taking: Reported on 10/30/2020)     gabapentin (NEURONTIN) 300 MG capsule  (Patient not taking: Reported on 10/30/2020)     gabapentin (NEURONTIN) 300 MG capsule Take by mouth. (Patient not taking: Reported on 10/30/2020)     prednisoLONE acetate (PRED FORTE) 1 % ophthalmic suspension  (Patient not taking: Reported on 10/30/2020)     triamcinolone ointment (KENALOG) 0.1 % Apply      small amount to affected area on scalp x 1 month (Patient not taking: Reported on 10/30/2020)     No current facility-administered medications for this encounter.    ALLERGIES: No Known Allergies  Physical Findings: unable to assess due to encounter type.  Lab Findings: No results found for: WBC, HGB, HCT, MCV, PLT   Radiographic Findings: No results found.  Impression/Plan: 1. Multifocal Squamous Cell Carcinoma of the skin. Dr. Lisbeth Renshaw discusses the pathology findings and reviews the nature of moh's procedures and post treatment options when high risk features  are seen in pathology. He also reviews scenarios for which patients may wish to forgo surgery and as an alternative consider radiotherapy in place of Mohs' surgery. The patient is very clear that he understands the rationale to proceed with adjuvant radiotherapy to the scalp, also including the area of disease adjacent to the prior mohs' surgery. He is also aware and very interested in radiotherapy in place of Mohs' surgery to his left ear. We discussed the risks, benefits, short, and long term effects of radiotherapy, and the patient is interested in proceeding. Dr. Lisbeth Renshaw discusses the delivery and logistics of radiotherapy and anticipates a course of 4 weeks of radiotherapy to both sites. He will be contacted by our staff to coordinate clinical set up at the [facility] for treatment planning, at which time he will sign written consent to proceed. 2. Multifocal Basal Cell Carcinoma of the Skin, with a high risk resected site of the left neck. After further discussion with Dr. Winifred Olive, he requests that we forgo further plans for radiotherapy to this location.    Given current concerns for patient exposure during the COVID-19 pandemic, this encounter was conducted via telephone.  The patient has given verbal consent for this type of encounter. The time spent during this encounter was 60 minutes and 50% of that time was spent in the coordination of his care. The attendants for this meeting include Dr. Lisbeth Renshaw, Shona Simpson, [facility] and Toy Baker.  During the encounter, Dr. Lisbeth Renshaw and Shona Simpson [employer] were located at [facility] Radiation Oncology Department.  Cheral Marker Zuckerman  was located at home.   The above documentation reflects my direct findings during this shared patient visit. Please see the separate note by Dr. Lisbeth Renshaw on this date for the remainder of the patient's plan of care.      Carola Rhine, PAC

## 2020-11-05 DIAGNOSIS — D0439 Carcinoma in situ of skin of other parts of face: Secondary | ICD-10-CM | POA: Diagnosis not present

## 2020-11-05 DIAGNOSIS — D485 Neoplasm of uncertain behavior of skin: Secondary | ICD-10-CM | POA: Diagnosis not present

## 2020-11-05 DIAGNOSIS — C4442 Squamous cell carcinoma of skin of scalp and neck: Secondary | ICD-10-CM | POA: Diagnosis not present

## 2020-11-05 DIAGNOSIS — C44229 Squamous cell carcinoma of skin of left ear and external auricular canal: Secondary | ICD-10-CM | POA: Diagnosis not present

## 2020-11-08 ENCOUNTER — Ambulatory Visit
Admission: RE | Admit: 2020-11-08 | Discharge: 2020-11-08 | Disposition: A | Discharge status: Home / Self Care | Payer: Medicare HMO | Source: Ambulatory Visit | Attending: Radiation Oncology | Admitting: Radiation Oncology

## 2020-11-08 DIAGNOSIS — C4441 Basal cell carcinoma of skin of scalp and neck: Secondary | ICD-10-CM | POA: Diagnosis not present

## 2020-11-08 DIAGNOSIS — Z510 Encounter for antineoplastic radiation therapy: Secondary | ICD-10-CM | POA: Insufficient documentation

## 2020-11-08 DIAGNOSIS — C44519 Basal cell carcinoma of skin of other part of trunk: Secondary | ICD-10-CM | POA: Diagnosis not present

## 2020-11-08 DIAGNOSIS — C44319 Basal cell carcinoma of skin of other parts of face: Secondary | ICD-10-CM | POA: Insufficient documentation

## 2020-11-08 DIAGNOSIS — C4442 Squamous cell carcinoma of skin of scalp and neck: Secondary | ICD-10-CM | POA: Diagnosis not present

## 2020-11-08 DIAGNOSIS — C44229 Squamous cell carcinoma of skin of left ear and external auricular canal: Secondary | ICD-10-CM | POA: Diagnosis not present

## 2020-11-11 DIAGNOSIS — Z0000 Encounter for general adult medical examination without abnormal findings: Secondary | ICD-10-CM | POA: Diagnosis not present

## 2020-11-11 DIAGNOSIS — Z1389 Encounter for screening for other disorder: Secondary | ICD-10-CM | POA: Diagnosis not present

## 2020-11-11 DIAGNOSIS — Z136 Encounter for screening for cardiovascular disorders: Secondary | ICD-10-CM | POA: Diagnosis not present

## 2020-11-11 DIAGNOSIS — Z85820 Personal history of malignant melanoma of skin: Secondary | ICD-10-CM | POA: Diagnosis not present

## 2020-11-11 DIAGNOSIS — M19079 Primary osteoarthritis, unspecified ankle and foot: Secondary | ICD-10-CM | POA: Diagnosis not present

## 2020-11-11 DIAGNOSIS — C4492 Squamous cell carcinoma of skin, unspecified: Secondary | ICD-10-CM | POA: Diagnosis not present

## 2020-11-11 DIAGNOSIS — Z13220 Encounter for screening for lipoid disorders: Secondary | ICD-10-CM | POA: Diagnosis not present

## 2020-11-12 DIAGNOSIS — C44229 Squamous cell carcinoma of skin of left ear and external auricular canal: Secondary | ICD-10-CM | POA: Diagnosis not present

## 2020-11-12 DIAGNOSIS — C44519 Basal cell carcinoma of skin of other part of trunk: Secondary | ICD-10-CM | POA: Diagnosis not present

## 2020-11-12 DIAGNOSIS — C44319 Basal cell carcinoma of skin of other parts of face: Secondary | ICD-10-CM | POA: Diagnosis not present

## 2020-11-12 DIAGNOSIS — C4441 Basal cell carcinoma of skin of scalp and neck: Secondary | ICD-10-CM | POA: Diagnosis not present

## 2020-11-12 DIAGNOSIS — Z510 Encounter for antineoplastic radiation therapy: Secondary | ICD-10-CM | POA: Diagnosis not present

## 2020-11-12 DIAGNOSIS — C4442 Squamous cell carcinoma of skin of scalp and neck: Secondary | ICD-10-CM | POA: Diagnosis not present

## 2020-11-14 ENCOUNTER — Other Ambulatory Visit: Payer: Self-pay

## 2020-11-14 ENCOUNTER — Ambulatory Visit
Admission: RE | Admit: 2020-11-14 | Discharge: 2020-11-14 | Disposition: A | Discharge status: Home / Self Care | Payer: Medicare HMO | Source: Ambulatory Visit | Attending: Radiation Oncology | Admitting: Radiation Oncology

## 2020-11-14 DIAGNOSIS — C44229 Squamous cell carcinoma of skin of left ear and external auricular canal: Secondary | ICD-10-CM | POA: Diagnosis not present

## 2020-11-14 DIAGNOSIS — C4442 Squamous cell carcinoma of skin of scalp and neck: Secondary | ICD-10-CM | POA: Diagnosis not present

## 2020-11-14 DIAGNOSIS — C44319 Basal cell carcinoma of skin of other parts of face: Secondary | ICD-10-CM | POA: Diagnosis not present

## 2020-11-14 DIAGNOSIS — C4441 Basal cell carcinoma of skin of scalp and neck: Secondary | ICD-10-CM | POA: Diagnosis not present

## 2020-11-14 DIAGNOSIS — Z510 Encounter for antineoplastic radiation therapy: Secondary | ICD-10-CM | POA: Diagnosis not present

## 2020-11-14 DIAGNOSIS — C44519 Basal cell carcinoma of skin of other part of trunk: Secondary | ICD-10-CM | POA: Diagnosis not present

## 2020-11-15 ENCOUNTER — Ambulatory Visit
Admission: RE | Admit: 2020-11-15 | Discharge: 2020-11-15 | Disposition: A | Discharge status: Home / Self Care | Payer: Medicare HMO | Source: Ambulatory Visit | Attending: Radiation Oncology | Admitting: Radiation Oncology

## 2020-11-15 ENCOUNTER — Other Ambulatory Visit: Payer: Self-pay

## 2020-11-15 DIAGNOSIS — C44519 Basal cell carcinoma of skin of other part of trunk: Secondary | ICD-10-CM | POA: Diagnosis not present

## 2020-11-15 DIAGNOSIS — Z510 Encounter for antineoplastic radiation therapy: Secondary | ICD-10-CM | POA: Diagnosis not present

## 2020-11-15 DIAGNOSIS — C4441 Basal cell carcinoma of skin of scalp and neck: Secondary | ICD-10-CM | POA: Diagnosis not present

## 2020-11-15 DIAGNOSIS — C44319 Basal cell carcinoma of skin of other parts of face: Secondary | ICD-10-CM | POA: Diagnosis not present

## 2020-11-17 ENCOUNTER — Ambulatory Visit
Admission: RE | Admit: 2020-11-17 | Discharge: 2020-11-17 | Disposition: A | Discharge status: Home / Self Care | Payer: Medicare HMO | Source: Ambulatory Visit | Attending: Radiation Oncology | Admitting: Radiation Oncology

## 2020-11-17 ENCOUNTER — Other Ambulatory Visit: Payer: Self-pay

## 2020-11-17 DIAGNOSIS — C4441 Basal cell carcinoma of skin of scalp and neck: Secondary | ICD-10-CM | POA: Diagnosis not present

## 2020-11-17 DIAGNOSIS — Z510 Encounter for antineoplastic radiation therapy: Secondary | ICD-10-CM | POA: Diagnosis not present

## 2020-11-17 DIAGNOSIS — C44319 Basal cell carcinoma of skin of other parts of face: Secondary | ICD-10-CM | POA: Diagnosis not present

## 2020-11-17 DIAGNOSIS — C44519 Basal cell carcinoma of skin of other part of trunk: Secondary | ICD-10-CM | POA: Diagnosis not present

## 2020-11-18 ENCOUNTER — Ambulatory Visit
Admission: RE | Admit: 2020-11-18 | Discharge: 2020-11-18 | Disposition: A | Discharge status: Home / Self Care | Payer: Medicare HMO | Source: Ambulatory Visit | Attending: Radiation Oncology | Admitting: Radiation Oncology

## 2020-11-18 DIAGNOSIS — C4441 Basal cell carcinoma of skin of scalp and neck: Secondary | ICD-10-CM | POA: Diagnosis not present

## 2020-11-18 DIAGNOSIS — Z510 Encounter for antineoplastic radiation therapy: Secondary | ICD-10-CM | POA: Diagnosis not present

## 2020-11-18 DIAGNOSIS — C44319 Basal cell carcinoma of skin of other parts of face: Secondary | ICD-10-CM | POA: Diagnosis not present

## 2020-11-18 DIAGNOSIS — C44519 Basal cell carcinoma of skin of other part of trunk: Secondary | ICD-10-CM | POA: Diagnosis not present

## 2020-11-19 ENCOUNTER — Ambulatory Visit
Admission: RE | Admit: 2020-11-19 | Discharge: 2020-11-19 | Disposition: A | Discharge status: Home / Self Care | Payer: Medicare HMO | Source: Ambulatory Visit | Attending: Radiation Oncology | Admitting: Radiation Oncology

## 2020-11-19 DIAGNOSIS — C4441 Basal cell carcinoma of skin of scalp and neck: Secondary | ICD-10-CM | POA: Diagnosis not present

## 2020-11-19 DIAGNOSIS — C44319 Basal cell carcinoma of skin of other parts of face: Secondary | ICD-10-CM | POA: Diagnosis not present

## 2020-11-19 DIAGNOSIS — Z510 Encounter for antineoplastic radiation therapy: Secondary | ICD-10-CM | POA: Diagnosis not present

## 2020-11-19 DIAGNOSIS — C44519 Basal cell carcinoma of skin of other part of trunk: Secondary | ICD-10-CM | POA: Diagnosis not present

## 2020-11-20 ENCOUNTER — Ambulatory Visit
Admission: RE | Admit: 2020-11-20 | Discharge: 2020-11-20 | Disposition: A | Discharge status: Home / Self Care | Payer: Medicare HMO | Source: Ambulatory Visit | Attending: Radiation Oncology | Admitting: Radiation Oncology

## 2020-11-20 DIAGNOSIS — C44319 Basal cell carcinoma of skin of other parts of face: Secondary | ICD-10-CM | POA: Diagnosis not present

## 2020-11-20 DIAGNOSIS — C4442 Squamous cell carcinoma of skin of scalp and neck: Secondary | ICD-10-CM | POA: Diagnosis not present

## 2020-11-20 DIAGNOSIS — C4441 Basal cell carcinoma of skin of scalp and neck: Secondary | ICD-10-CM | POA: Diagnosis not present

## 2020-11-20 DIAGNOSIS — Z510 Encounter for antineoplastic radiation therapy: Secondary | ICD-10-CM | POA: Diagnosis not present

## 2020-11-20 DIAGNOSIS — C44519 Basal cell carcinoma of skin of other part of trunk: Secondary | ICD-10-CM | POA: Diagnosis not present

## 2020-11-20 DIAGNOSIS — C44229 Squamous cell carcinoma of skin of left ear and external auricular canal: Secondary | ICD-10-CM | POA: Diagnosis not present

## 2020-11-25 ENCOUNTER — Ambulatory Visit
Admission: RE | Admit: 2020-11-25 | Discharge: 2020-11-25 | Disposition: A | Discharge status: Home / Self Care | Payer: Medicare HMO | Source: Ambulatory Visit | Attending: Radiation Oncology | Admitting: Radiation Oncology

## 2020-11-25 DIAGNOSIS — C44319 Basal cell carcinoma of skin of other parts of face: Secondary | ICD-10-CM | POA: Diagnosis not present

## 2020-11-25 DIAGNOSIS — Z510 Encounter for antineoplastic radiation therapy: Secondary | ICD-10-CM | POA: Diagnosis not present

## 2020-11-25 DIAGNOSIS — C44519 Basal cell carcinoma of skin of other part of trunk: Secondary | ICD-10-CM | POA: Diagnosis not present

## 2020-11-25 DIAGNOSIS — C4441 Basal cell carcinoma of skin of scalp and neck: Secondary | ICD-10-CM | POA: Diagnosis not present

## 2020-11-26 ENCOUNTER — Ambulatory Visit
Admission: RE | Admit: 2020-11-26 | Discharge: 2020-11-26 | Disposition: A | Discharge status: Home / Self Care | Payer: Medicare HMO | Source: Ambulatory Visit | Attending: Radiation Oncology | Admitting: Radiation Oncology

## 2020-11-26 DIAGNOSIS — C4441 Basal cell carcinoma of skin of scalp and neck: Secondary | ICD-10-CM | POA: Diagnosis not present

## 2020-11-26 DIAGNOSIS — C44519 Basal cell carcinoma of skin of other part of trunk: Secondary | ICD-10-CM | POA: Diagnosis not present

## 2020-11-26 DIAGNOSIS — C44319 Basal cell carcinoma of skin of other parts of face: Secondary | ICD-10-CM | POA: Diagnosis not present

## 2020-11-26 DIAGNOSIS — Z510 Encounter for antineoplastic radiation therapy: Secondary | ICD-10-CM | POA: Diagnosis not present

## 2020-11-27 ENCOUNTER — Ambulatory Visit
Admission: RE | Admit: 2020-11-27 | Discharge: 2020-11-27 | Disposition: A | Discharge status: Home / Self Care | Payer: Medicare HMO | Source: Ambulatory Visit | Attending: Radiation Oncology | Admitting: Radiation Oncology

## 2020-11-27 DIAGNOSIS — C4441 Basal cell carcinoma of skin of scalp and neck: Secondary | ICD-10-CM | POA: Diagnosis not present

## 2020-11-27 DIAGNOSIS — C44519 Basal cell carcinoma of skin of other part of trunk: Secondary | ICD-10-CM | POA: Diagnosis not present

## 2020-11-27 DIAGNOSIS — Z510 Encounter for antineoplastic radiation therapy: Secondary | ICD-10-CM | POA: Diagnosis not present

## 2020-11-27 DIAGNOSIS — C44319 Basal cell carcinoma of skin of other parts of face: Secondary | ICD-10-CM | POA: Insufficient documentation

## 2020-11-28 ENCOUNTER — Ambulatory Visit
Admission: RE | Admit: 2020-11-28 | Discharge: 2020-11-28 | Disposition: A | Discharge status: Home / Self Care | Payer: Medicare HMO | Source: Ambulatory Visit | Attending: Radiation Oncology | Admitting: Radiation Oncology

## 2020-11-28 DIAGNOSIS — C4441 Basal cell carcinoma of skin of scalp and neck: Secondary | ICD-10-CM | POA: Diagnosis not present

## 2020-11-28 DIAGNOSIS — C44519 Basal cell carcinoma of skin of other part of trunk: Secondary | ICD-10-CM | POA: Diagnosis not present

## 2020-11-28 DIAGNOSIS — C44319 Basal cell carcinoma of skin of other parts of face: Secondary | ICD-10-CM | POA: Diagnosis not present

## 2020-11-28 DIAGNOSIS — Z510 Encounter for antineoplastic radiation therapy: Secondary | ICD-10-CM | POA: Diagnosis not present

## 2020-11-29 ENCOUNTER — Ambulatory Visit
Admission: RE | Admit: 2020-11-29 | Discharge: 2020-11-29 | Disposition: A | Discharge status: Home / Self Care | Payer: Medicare HMO | Source: Ambulatory Visit | Attending: Radiation Oncology | Admitting: Radiation Oncology

## 2020-11-29 DIAGNOSIS — C44519 Basal cell carcinoma of skin of other part of trunk: Secondary | ICD-10-CM | POA: Diagnosis not present

## 2020-11-29 DIAGNOSIS — C44319 Basal cell carcinoma of skin of other parts of face: Secondary | ICD-10-CM | POA: Diagnosis not present

## 2020-11-29 DIAGNOSIS — Z510 Encounter for antineoplastic radiation therapy: Secondary | ICD-10-CM | POA: Diagnosis not present

## 2020-11-29 DIAGNOSIS — C4442 Squamous cell carcinoma of skin of scalp and neck: Secondary | ICD-10-CM | POA: Diagnosis not present

## 2020-11-29 DIAGNOSIS — C44229 Squamous cell carcinoma of skin of left ear and external auricular canal: Secondary | ICD-10-CM | POA: Diagnosis not present

## 2020-11-29 DIAGNOSIS — C4441 Basal cell carcinoma of skin of scalp and neck: Secondary | ICD-10-CM | POA: Diagnosis not present

## 2020-12-02 ENCOUNTER — Ambulatory Visit
Admission: RE | Admit: 2020-12-02 | Discharge: 2020-12-02 | Disposition: A | Discharge status: Home / Self Care | Payer: Medicare HMO | Source: Ambulatory Visit | Attending: Radiation Oncology | Admitting: Radiation Oncology

## 2020-12-02 DIAGNOSIS — C44319 Basal cell carcinoma of skin of other parts of face: Secondary | ICD-10-CM | POA: Diagnosis not present

## 2020-12-02 DIAGNOSIS — C4441 Basal cell carcinoma of skin of scalp and neck: Secondary | ICD-10-CM | POA: Diagnosis not present

## 2020-12-02 DIAGNOSIS — C44519 Basal cell carcinoma of skin of other part of trunk: Secondary | ICD-10-CM | POA: Diagnosis not present

## 2020-12-02 DIAGNOSIS — Z510 Encounter for antineoplastic radiation therapy: Secondary | ICD-10-CM | POA: Diagnosis not present

## 2020-12-03 ENCOUNTER — Ambulatory Visit
Admission: RE | Admit: 2020-12-03 | Discharge: 2020-12-03 | Disposition: A | Discharge status: Home / Self Care | Payer: Medicare HMO | Source: Ambulatory Visit | Attending: Radiation Oncology | Admitting: Radiation Oncology

## 2020-12-03 DIAGNOSIS — C44319 Basal cell carcinoma of skin of other parts of face: Secondary | ICD-10-CM | POA: Diagnosis not present

## 2020-12-03 DIAGNOSIS — C44519 Basal cell carcinoma of skin of other part of trunk: Secondary | ICD-10-CM | POA: Diagnosis not present

## 2020-12-03 DIAGNOSIS — C4441 Basal cell carcinoma of skin of scalp and neck: Secondary | ICD-10-CM | POA: Diagnosis not present

## 2020-12-03 DIAGNOSIS — Z510 Encounter for antineoplastic radiation therapy: Secondary | ICD-10-CM | POA: Diagnosis not present

## 2020-12-04 ENCOUNTER — Ambulatory Visit
Admission: RE | Admit: 2020-12-04 | Discharge: 2020-12-04 | Disposition: A | Discharge status: Home / Self Care | Payer: Medicare HMO | Source: Ambulatory Visit | Attending: Radiation Oncology | Admitting: Radiation Oncology

## 2020-12-04 ENCOUNTER — Other Ambulatory Visit: Payer: Self-pay

## 2020-12-04 DIAGNOSIS — Z510 Encounter for antineoplastic radiation therapy: Secondary | ICD-10-CM | POA: Diagnosis not present

## 2020-12-04 DIAGNOSIS — C44519 Basal cell carcinoma of skin of other part of trunk: Secondary | ICD-10-CM | POA: Diagnosis not present

## 2020-12-04 DIAGNOSIS — C44319 Basal cell carcinoma of skin of other parts of face: Secondary | ICD-10-CM | POA: Diagnosis not present

## 2020-12-04 DIAGNOSIS — C4441 Basal cell carcinoma of skin of scalp and neck: Secondary | ICD-10-CM | POA: Diagnosis not present

## 2020-12-05 ENCOUNTER — Ambulatory Visit
Admission: RE | Admit: 2020-12-05 | Discharge: 2020-12-05 | Disposition: A | Discharge status: Home / Self Care | Payer: Medicare HMO | Source: Ambulatory Visit | Attending: Radiation Oncology | Admitting: Radiation Oncology

## 2020-12-05 DIAGNOSIS — C44319 Basal cell carcinoma of skin of other parts of face: Secondary | ICD-10-CM | POA: Diagnosis not present

## 2020-12-05 DIAGNOSIS — C44519 Basal cell carcinoma of skin of other part of trunk: Secondary | ICD-10-CM | POA: Diagnosis not present

## 2020-12-05 DIAGNOSIS — C4441 Basal cell carcinoma of skin of scalp and neck: Secondary | ICD-10-CM | POA: Diagnosis not present

## 2020-12-05 DIAGNOSIS — Z510 Encounter for antineoplastic radiation therapy: Secondary | ICD-10-CM | POA: Diagnosis not present

## 2020-12-06 ENCOUNTER — Ambulatory Visit
Admission: RE | Admit: 2020-12-06 | Discharge: 2020-12-06 | Disposition: A | Discharge status: Home / Self Care | Payer: Medicare HMO | Source: Ambulatory Visit | Attending: Radiation Oncology | Admitting: Radiation Oncology

## 2020-12-06 ENCOUNTER — Other Ambulatory Visit: Payer: Self-pay

## 2020-12-06 DIAGNOSIS — C44319 Basal cell carcinoma of skin of other parts of face: Secondary | ICD-10-CM | POA: Diagnosis not present

## 2020-12-06 DIAGNOSIS — Z510 Encounter for antineoplastic radiation therapy: Secondary | ICD-10-CM | POA: Diagnosis not present

## 2020-12-06 DIAGNOSIS — C44229 Squamous cell carcinoma of skin of left ear and external auricular canal: Secondary | ICD-10-CM | POA: Diagnosis not present

## 2020-12-06 DIAGNOSIS — C4441 Basal cell carcinoma of skin of scalp and neck: Secondary | ICD-10-CM | POA: Diagnosis not present

## 2020-12-06 DIAGNOSIS — C4442 Squamous cell carcinoma of skin of scalp and neck: Secondary | ICD-10-CM | POA: Diagnosis not present

## 2020-12-06 DIAGNOSIS — C44519 Basal cell carcinoma of skin of other part of trunk: Secondary | ICD-10-CM | POA: Diagnosis not present

## 2020-12-09 ENCOUNTER — Ambulatory Visit
Admission: RE | Admit: 2020-12-09 | Discharge: 2020-12-09 | Disposition: A | Discharge status: Home / Self Care | Payer: Medicare HMO | Source: Ambulatory Visit | Attending: Radiation Oncology | Admitting: Radiation Oncology

## 2020-12-09 DIAGNOSIS — Z510 Encounter for antineoplastic radiation therapy: Secondary | ICD-10-CM | POA: Diagnosis not present

## 2020-12-09 DIAGNOSIS — C44319 Basal cell carcinoma of skin of other parts of face: Secondary | ICD-10-CM | POA: Diagnosis not present

## 2020-12-09 DIAGNOSIS — C44519 Basal cell carcinoma of skin of other part of trunk: Secondary | ICD-10-CM | POA: Diagnosis not present

## 2020-12-09 DIAGNOSIS — C4441 Basal cell carcinoma of skin of scalp and neck: Secondary | ICD-10-CM | POA: Diagnosis not present

## 2020-12-10 ENCOUNTER — Ambulatory Visit
Admission: RE | Admit: 2020-12-10 | Discharge: 2020-12-10 | Disposition: A | Discharge status: Home / Self Care | Payer: Medicare HMO | Source: Ambulatory Visit | Attending: Radiation Oncology | Admitting: Radiation Oncology

## 2020-12-10 DIAGNOSIS — Z510 Encounter for antineoplastic radiation therapy: Secondary | ICD-10-CM | POA: Diagnosis not present

## 2020-12-10 DIAGNOSIS — C44519 Basal cell carcinoma of skin of other part of trunk: Secondary | ICD-10-CM | POA: Diagnosis not present

## 2020-12-10 DIAGNOSIS — C44319 Basal cell carcinoma of skin of other parts of face: Secondary | ICD-10-CM | POA: Diagnosis not present

## 2020-12-10 DIAGNOSIS — C4441 Basal cell carcinoma of skin of scalp and neck: Secondary | ICD-10-CM | POA: Diagnosis not present

## 2020-12-11 ENCOUNTER — Ambulatory Visit
Admission: RE | Admit: 2020-12-11 | Discharge: 2020-12-11 | Disposition: A | Discharge status: Home / Self Care | Payer: Medicare HMO | Source: Ambulatory Visit | Attending: Radiation Oncology | Admitting: Radiation Oncology

## 2020-12-11 ENCOUNTER — Other Ambulatory Visit: Payer: Self-pay

## 2020-12-11 DIAGNOSIS — Z510 Encounter for antineoplastic radiation therapy: Secondary | ICD-10-CM | POA: Diagnosis not present

## 2020-12-11 DIAGNOSIS — C44319 Basal cell carcinoma of skin of other parts of face: Secondary | ICD-10-CM | POA: Diagnosis not present

## 2020-12-11 DIAGNOSIS — C44519 Basal cell carcinoma of skin of other part of trunk: Secondary | ICD-10-CM | POA: Diagnosis not present

## 2020-12-11 DIAGNOSIS — C4441 Basal cell carcinoma of skin of scalp and neck: Secondary | ICD-10-CM | POA: Diagnosis not present

## 2020-12-12 ENCOUNTER — Ambulatory Visit
Admission: RE | Admit: 2020-12-12 | Discharge: 2020-12-12 | Disposition: A | Discharge status: Home / Self Care | Payer: Medicare HMO | Source: Ambulatory Visit | Attending: Radiation Oncology | Admitting: Radiation Oncology

## 2020-12-12 ENCOUNTER — Encounter: Payer: Self-pay | Admitting: Radiation Oncology

## 2020-12-12 ENCOUNTER — Ambulatory Visit: Payer: Medicare HMO

## 2020-12-12 DIAGNOSIS — C44519 Basal cell carcinoma of skin of other part of trunk: Secondary | ICD-10-CM | POA: Diagnosis not present

## 2020-12-12 DIAGNOSIS — Z510 Encounter for antineoplastic radiation therapy: Secondary | ICD-10-CM | POA: Diagnosis not present

## 2020-12-12 DIAGNOSIS — C44319 Basal cell carcinoma of skin of other parts of face: Secondary | ICD-10-CM | POA: Diagnosis not present

## 2020-12-12 DIAGNOSIS — C4441 Basal cell carcinoma of skin of scalp and neck: Secondary | ICD-10-CM | POA: Diagnosis not present

## 2020-12-13 ENCOUNTER — Ambulatory Visit: Payer: Medicare HMO

## 2021-01-03 ENCOUNTER — Ambulatory Visit: Payer: Medicare HMO | Attending: Internal Medicine

## 2021-01-03 DIAGNOSIS — Z23 Encounter for immunization: Secondary | ICD-10-CM

## 2021-01-03 NOTE — Progress Notes (Signed)
   Covid-19 Vaccination Clinic  Name:  Sean Skinner    MRN: [id number] DOB: 1941/04/25  01/03/2021  Mr. Donn was observed post Covid-19 immunization for 15 minutes without incident. He was provided with Vaccine Information Sheet and instruction to access the V-Safe system.   Mr. Shrewsberry was instructed to call 911 with any severe reactions post vaccine: Marland Kitchen Difficulty breathing  . Swelling of face and throat  . A fast heartbeat  . A bad rash all over body  . Dizziness and weakness   Immunizations Administered    Name Date Dose VIS Date Route   Pfizer COVID-19 Vaccine 01/03/2021  5:01 PM 0.3 mL 10/16/2020 Intramuscular   Manufacturer: [place]   Lot: [id number]   NDC: [address]-7

## 2021-01-13 ENCOUNTER — Telehealth: Payer: Self-pay | Admitting: Radiation Oncology

## 2021-01-20 NOTE — Telephone Encounter (Signed)
  Radiation Oncology         [phone] ________________________________  Name: Sean Skinner MRN: [id number]  Date of Service: 01/20/21 DOB: 1941-02-06  Post Treatment Telephone Note  Diagnosis: Multifocal Squamous Cell Carcinoma of the skin.  Interval Since Last Radiation:  6 weeks   11/14/20-12/12/20: The scalp and left ear were treated to 50 Gy in 20 fractions  07/08/2017 to 08/17/2017: 1. The scalp was treated to 40 Gy in 10 fractions at 4 Gy per fraction.  2. The Left and Right cheek were treated to 28 Gy in 7 fractions at 4 Gy per fraction.   Narrative:  The patient was contacted today for routine follow-up. During treatment he did very well with radiotherapy and did not have significant desquamation. He reports he is doing great. His left ear has healed nicely and he only has a little bit left of healing along his scalp. He is hoping to get out an play golf tomorrow.   Impression/Plan: 1.         Multifocal Squamous Cell Carcinoma of the skin.The patient has been doing well since radiotherapy and has plans to follow up with Dr. Governor Rooks. Danielle Dess. I let him know that we would be happy to see him moving forward and he is in agreement to let us know if he has questions or concerns about his prior treatment. 2.         Multifocal Basal Cell Carcinoma of the Skin, with a high risk resected site of the left neck. After further discussion with Dr. Winifred Olive in November 2021, he requests that we forgo further plans for radiotherapy to this location.     Carola Rhine, PAC

## 2021-01-23 NOTE — Progress Notes (Signed)
  Radiation Oncology         [phone] ________________________________  Name: Sean Skinner MRN: [id number]  Date: 12/12/2020  DOB: September 06, 1941  End of Treatment Note  Diagnosis:   squamous cell carcinoma of the skin  Indication for treatment::  curative       Radiation treatment dates:   11/14/20 - 12/12/20  Site/dose:    1.  The right scalp superiorly was treated to a dose of 50 Gray in 20 fractions using 6 MAV electrons, corresponding to an en face electron field.  2.  The left ear was treated to a dose of 50 Gray in 20 fractions using 6 MAV electrons, corresponding to an en face electron field.  Narrative: The patient tolerated radiation treatment relatively well.   Moderate erythema was present during the course of radiation treatment.  Plan: The patient has completed radiation treatment. The patient will return to radiation oncology clinic for routine followup in one month. I advised the patient to call or return sooner if they have any questions or concerns related to their recovery or treatment. ________________________________  Jodelle Gross, M.D., Ph.D.

## 2021-01-29 DIAGNOSIS — Z85828 Personal history of other malignant neoplasm of skin: Secondary | ICD-10-CM | POA: Diagnosis not present

## 2021-01-29 DIAGNOSIS — C44629 Squamous cell carcinoma of skin of left upper limb, including shoulder: Secondary | ICD-10-CM | POA: Diagnosis not present

## 2021-01-29 DIAGNOSIS — L570 Actinic keratosis: Secondary | ICD-10-CM | POA: Diagnosis not present

## 2021-01-29 DIAGNOSIS — L905 Scar conditions and fibrosis of skin: Secondary | ICD-10-CM | POA: Diagnosis not present

## 2021-01-29 DIAGNOSIS — Z85820 Personal history of malignant melanoma of skin: Secondary | ICD-10-CM | POA: Diagnosis not present

## 2021-01-29 DIAGNOSIS — D485 Neoplasm of uncertain behavior of skin: Secondary | ICD-10-CM | POA: Diagnosis not present

## 2021-01-29 DIAGNOSIS — C4442 Squamous cell carcinoma of skin of scalp and neck: Secondary | ICD-10-CM | POA: Diagnosis not present

## 2021-02-12 DIAGNOSIS — L570 Actinic keratosis: Secondary | ICD-10-CM | POA: Diagnosis not present

## 2021-02-12 DIAGNOSIS — C44629 Squamous cell carcinoma of skin of left upper limb, including shoulder: Secondary | ICD-10-CM | POA: Diagnosis not present

## 2021-02-12 DIAGNOSIS — C4442 Squamous cell carcinoma of skin of scalp and neck: Secondary | ICD-10-CM | POA: Diagnosis not present

## 2021-02-26 DIAGNOSIS — D485 Neoplasm of uncertain behavior of skin: Secondary | ICD-10-CM | POA: Diagnosis not present

## 2021-02-26 DIAGNOSIS — C4442 Squamous cell carcinoma of skin of scalp and neck: Secondary | ICD-10-CM | POA: Diagnosis not present

## 2021-02-26 DIAGNOSIS — C44722 Squamous cell carcinoma of skin of right lower limb, including hip: Secondary | ICD-10-CM | POA: Diagnosis not present

## 2021-03-25 DIAGNOSIS — C44729 Squamous cell carcinoma of skin of left lower limb, including hip: Secondary | ICD-10-CM | POA: Diagnosis not present

## 2021-03-25 DIAGNOSIS — D485 Neoplasm of uncertain behavior of skin: Secondary | ICD-10-CM | POA: Diagnosis not present

## 2021-03-25 DIAGNOSIS — C44722 Squamous cell carcinoma of skin of right lower limb, including hip: Secondary | ICD-10-CM | POA: Diagnosis not present

## 2021-04-23 DIAGNOSIS — H524 Presbyopia: Secondary | ICD-10-CM | POA: Diagnosis not present

## 2021-04-23 DIAGNOSIS — B0239 Other herpes zoster eye disease: Secondary | ICD-10-CM | POA: Diagnosis not present

## 2021-04-23 DIAGNOSIS — D485 Neoplasm of uncertain behavior of skin: Secondary | ICD-10-CM | POA: Diagnosis not present

## 2021-04-23 DIAGNOSIS — C44722 Squamous cell carcinoma of skin of right lower limb, including hip: Secondary | ICD-10-CM | POA: Diagnosis not present

## 2021-04-23 DIAGNOSIS — H02055 Trichiasis without entropian left lower eyelid: Secondary | ICD-10-CM | POA: Diagnosis not present

## 2021-04-23 DIAGNOSIS — C44719 Basal cell carcinoma of skin of left lower limb, including hip: Secondary | ICD-10-CM | POA: Diagnosis not present

## 2021-05-07 DIAGNOSIS — B0239 Other herpes zoster eye disease: Secondary | ICD-10-CM | POA: Diagnosis not present

## 2021-05-14 DIAGNOSIS — C44712 Basal cell carcinoma of skin of right lower limb, including hip: Secondary | ICD-10-CM | POA: Diagnosis not present

## 2021-05-14 DIAGNOSIS — D485 Neoplasm of uncertain behavior of skin: Secondary | ICD-10-CM | POA: Diagnosis not present

## 2021-05-14 DIAGNOSIS — C44722 Squamous cell carcinoma of skin of right lower limb, including hip: Secondary | ICD-10-CM | POA: Diagnosis not present

## 2021-05-20 DIAGNOSIS — B0239 Other herpes zoster eye disease: Secondary | ICD-10-CM | POA: Diagnosis not present

## 2021-06-11 DIAGNOSIS — C44722 Squamous cell carcinoma of skin of right lower limb, including hip: Secondary | ICD-10-CM | POA: Diagnosis not present

## 2021-06-24 DIAGNOSIS — B0239 Other herpes zoster eye disease: Secondary | ICD-10-CM | POA: Diagnosis not present

## 2021-07-29 DIAGNOSIS — C44629 Squamous cell carcinoma of skin of left upper limb, including shoulder: Secondary | ICD-10-CM | POA: Diagnosis not present

## 2021-07-29 DIAGNOSIS — C4442 Squamous cell carcinoma of skin of scalp and neck: Secondary | ICD-10-CM | POA: Diagnosis not present

## 2021-07-29 DIAGNOSIS — L905 Scar conditions and fibrosis of skin: Secondary | ICD-10-CM | POA: Diagnosis not present

## 2021-07-29 DIAGNOSIS — D485 Neoplasm of uncertain behavior of skin: Secondary | ICD-10-CM | POA: Diagnosis not present

## 2021-07-29 DIAGNOSIS — Z85828 Personal history of other malignant neoplasm of skin: Secondary | ICD-10-CM | POA: Diagnosis not present

## 2021-09-10 DIAGNOSIS — D485 Neoplasm of uncertain behavior of skin: Secondary | ICD-10-CM | POA: Diagnosis not present

## 2021-09-10 DIAGNOSIS — D0462 Carcinoma in situ of skin of left upper limb, including shoulder: Secondary | ICD-10-CM | POA: Diagnosis not present

## 2021-09-10 DIAGNOSIS — C4442 Squamous cell carcinoma of skin of scalp and neck: Secondary | ICD-10-CM | POA: Diagnosis not present

## 2021-09-24 DIAGNOSIS — D485 Neoplasm of uncertain behavior of skin: Secondary | ICD-10-CM | POA: Diagnosis not present

## 2021-09-24 DIAGNOSIS — C44629 Squamous cell carcinoma of skin of left upper limb, including shoulder: Secondary | ICD-10-CM | POA: Diagnosis not present

## 2021-09-24 DIAGNOSIS — C44712 Basal cell carcinoma of skin of right lower limb, including hip: Secondary | ICD-10-CM | POA: Diagnosis not present

## 2021-09-24 DIAGNOSIS — D0462 Carcinoma in situ of skin of left upper limb, including shoulder: Secondary | ICD-10-CM | POA: Diagnosis not present

## 2021-10-29 DIAGNOSIS — C44629 Squamous cell carcinoma of skin of left upper limb, including shoulder: Secondary | ICD-10-CM | POA: Diagnosis not present

## 2021-11-12 DIAGNOSIS — C44629 Squamous cell carcinoma of skin of left upper limb, including shoulder: Secondary | ICD-10-CM | POA: Diagnosis not present

## 2021-11-25 DIAGNOSIS — Z23 Encounter for immunization: Secondary | ICD-10-CM | POA: Diagnosis not present

## 2021-11-25 DIAGNOSIS — Z1389 Encounter for screening for other disorder: Secondary | ICD-10-CM | POA: Diagnosis not present

## 2021-11-25 DIAGNOSIS — B0229 Other postherpetic nervous system involvement: Secondary | ICD-10-CM | POA: Diagnosis not present

## 2021-11-25 DIAGNOSIS — Z85828 Personal history of other malignant neoplasm of skin: Secondary | ICD-10-CM | POA: Diagnosis not present

## 2021-11-25 DIAGNOSIS — Z0000 Encounter for general adult medical examination without abnormal findings: Secondary | ICD-10-CM | POA: Diagnosis not present

## 2021-11-25 DIAGNOSIS — R17 Unspecified jaundice: Secondary | ICD-10-CM | POA: Diagnosis not present

## 2021-12-17 DIAGNOSIS — B0239 Other herpes zoster eye disease: Secondary | ICD-10-CM | POA: Diagnosis not present

## 2021-12-17 DIAGNOSIS — H52203 Unspecified astigmatism, bilateral: Secondary | ICD-10-CM | POA: Diagnosis not present

## 2021-12-17 DIAGNOSIS — H2513 Age-related nuclear cataract, bilateral: Secondary | ICD-10-CM | POA: Diagnosis not present

## 2022-01-05 DIAGNOSIS — C44712 Basal cell carcinoma of skin of right lower limb, including hip: Secondary | ICD-10-CM | POA: Diagnosis not present

## 2022-01-05 DIAGNOSIS — C44622 Squamous cell carcinoma of skin of right upper limb, including shoulder: Secondary | ICD-10-CM | POA: Diagnosis not present

## 2022-01-05 DIAGNOSIS — D485 Neoplasm of uncertain behavior of skin: Secondary | ICD-10-CM | POA: Diagnosis not present

## 2022-01-29 DIAGNOSIS — H25812 Combined forms of age-related cataract, left eye: Secondary | ICD-10-CM | POA: Diagnosis not present

## 2022-01-29 DIAGNOSIS — H2512 Age-related nuclear cataract, left eye: Secondary | ICD-10-CM | POA: Diagnosis not present

## 2022-01-29 DIAGNOSIS — H21562 Pupillary abnormality, left eye: Secondary | ICD-10-CM | POA: Diagnosis not present

## 2022-01-29 DIAGNOSIS — H2181 Floppy iris syndrome: Secondary | ICD-10-CM | POA: Diagnosis not present

## 2022-02-02 DIAGNOSIS — C44622 Squamous cell carcinoma of skin of right upper limb, including shoulder: Secondary | ICD-10-CM | POA: Diagnosis not present

## 2022-02-20 DIAGNOSIS — H35352 Cystoid macular degeneration, left eye: Secondary | ICD-10-CM | POA: Diagnosis not present

## 2022-03-06 DIAGNOSIS — H35352 Cystoid macular degeneration, left eye: Secondary | ICD-10-CM | POA: Diagnosis not present

## 2022-03-20 DIAGNOSIS — H35352 Cystoid macular degeneration, left eye: Secondary | ICD-10-CM | POA: Diagnosis not present

## 2022-04-10 DIAGNOSIS — H35352 Cystoid macular degeneration, left eye: Secondary | ICD-10-CM | POA: Diagnosis not present

## 2022-05-22 DIAGNOSIS — H35352 Cystoid macular degeneration, left eye: Secondary | ICD-10-CM | POA: Diagnosis not present

## 2022-06-16 DIAGNOSIS — H35352 Cystoid macular degeneration, left eye: Secondary | ICD-10-CM | POA: Diagnosis not present

## 2022-09-04 DIAGNOSIS — C44622 Squamous cell carcinoma of skin of right upper limb, including shoulder: Secondary | ICD-10-CM | POA: Diagnosis not present

## 2022-09-04 DIAGNOSIS — C4442 Squamous cell carcinoma of skin of scalp and neck: Secondary | ICD-10-CM | POA: Diagnosis not present

## 2022-09-04 DIAGNOSIS — C44719 Basal cell carcinoma of skin of left lower limb, including hip: Secondary | ICD-10-CM | POA: Diagnosis not present

## 2022-09-04 DIAGNOSIS — C44712 Basal cell carcinoma of skin of right lower limb, including hip: Secondary | ICD-10-CM | POA: Diagnosis not present

## 2022-09-04 DIAGNOSIS — D485 Neoplasm of uncertain behavior of skin: Secondary | ICD-10-CM | POA: Diagnosis not present

## 2022-09-04 DIAGNOSIS — Z08 Encounter for follow-up examination after completed treatment for malignant neoplasm: Secondary | ICD-10-CM | POA: Diagnosis not present

## 2022-09-04 DIAGNOSIS — C44629 Squamous cell carcinoma of skin of left upper limb, including shoulder: Secondary | ICD-10-CM | POA: Diagnosis not present

## 2022-09-04 DIAGNOSIS — Z85828 Personal history of other malignant neoplasm of skin: Secondary | ICD-10-CM | POA: Diagnosis not present

## 2022-09-09 DIAGNOSIS — Z961 Presence of intraocular lens: Secondary | ICD-10-CM | POA: Diagnosis not present

## 2022-09-09 DIAGNOSIS — H35352 Cystoid macular degeneration, left eye: Secondary | ICD-10-CM | POA: Diagnosis not present

## 2022-10-19 DIAGNOSIS — C4442 Squamous cell carcinoma of skin of scalp and neck: Secondary | ICD-10-CM | POA: Diagnosis not present

## 2022-10-21 ENCOUNTER — Encounter: Payer: Self-pay | Admitting: Plastic Surgery

## 2022-10-21 ENCOUNTER — Ambulatory Visit: Payer: Medicare HMO | Admitting: Plastic Surgery

## 2022-10-21 VITALS — BP 160/82 | HR 90 | Ht 74.0 in | Wt 201.0 lb

## 2022-10-21 DIAGNOSIS — Z85828 Personal history of other malignant neoplasm of skin: Secondary | ICD-10-CM | POA: Diagnosis not present

## 2022-10-21 DIAGNOSIS — S0100XD Unspecified open wound of scalp, subsequent encounter: Secondary | ICD-10-CM

## 2022-10-21 DIAGNOSIS — Z923 Personal history of irradiation: Secondary | ICD-10-CM

## 2022-10-21 NOTE — Progress Notes (Signed)
Referring Provider Lavone Orn, MD [place] [address] [place],  [place] [address]   CC:  Chief Complaint  Patient presents with   Advice Only      Sean Skinner is an 81 y.o. male.  HPI: Sean Skinner is an 81 year old male who was seen in the clinic approximately 2 years ago with an open scalp wound in the right parietal region.  The wound was a result of a excision of a squamous cell carcinoma and subsequent radiation therapy to the area.  His initial visit he was given an option for healing by secondary intention versus wound closure with a skin substitute.  Secondary intention closure has failed and the wound is still present.  He is requesting that I assist him with treatment of the wound.  No Known Allergies  Outpatient Encounter Medications as of 10/21/2022  Medication Sig   amoxicillin-clavulanate (AUGMENTIN) 875-125 MG tablet Add'l Sig Add'l Sig oral Add'l Sig   doxycycline (VIBRAMYCIN) 100 MG capsule Take 100 mg by mouth 2 (two) times daily.   EFUDEX 5 % cream 1 application apply on the skin twice a day  apply on the skin twice a day x 3 weeks   fish oil-omega-3 fatty acids 1000 MG capsule Take 2 g by mouth daily.   gabapentin (NEURONTIN) 300 MG capsule    gabapentin (NEURONTIN) 300 MG capsule Take by mouth.   prednisoLONE acetate (PRED FORTE) 1 % ophthalmic suspension    triamcinolone ointment (KENALOG) 0.1 % Apply      small amount to affected area on scalp x 1 month   valACYclovir (VALTREX) 1000 MG tablet    No facility-administered encounter medications on file as of 10/21/2022.     Past Medical History:  Diagnosis Date   Basal cell cancer    face,arms,neck skin    Past Surgical History:  Procedure Laterality Date   COLONOSCOPY WITH PROPOFOL N/A 03/13/2013   Procedure: COLONOSCOPY WITH PROPOFOL;  Surgeon: Garlan Fair, MD;  Location: [facility];  Service: Endoscopy;  Laterality: N/A;   Removal of skin cancer      No family history on  file.  Social History   Social History Narrative   Not on file     Review of Systems General: Denies fevers, chills, weight loss CV: Denies chest pain, shortness of breath, palpitations Skin: Patient has had multiple basal cell and squamous cell carcinomas removed from all over his body.  On his right parietal scalp he has of an open wound that he says occasionally drains but is not painful.  Physical Exam    10/21/2022    9:58 AM 06/21/2020   10:28 AM 09/28/2017    8:16 AM  Vitals with BMI  Height '6\' 2"'$  '6\' 2"'$  6' 2.5"  Weight 201 lbs 196 lbs 6 oz 196 lbs  BMI 25.8 74.25 95.63  Systolic 875 643 329  Diastolic 82 72 78  Pulse 90 78 60    General:  No acute distress,  Alert and oriented, Non-Toxic, Normal speech and affect Integument: The patient has multiple surgical incisions that are in various stages of healing including 1 on his left neck with from earlier this week.  All of healed relatively well in the current incision has no evidence of erythema.  On his scalp he has a an open wound that measures approximately 5 x 5 cm at the base of which is an area approximately 2 cm in width of exposed skull.  The remainder  of the base of the wound has has a fibrinous exudate but there is no evidence of infection.  The edges of the wound are healed with scar and there is no surrounding erythema.  Assessment/Plan Open scalp lesion: The patient has an open scalp wound that has failed to heal.  Recommended to he and his daughter that he undergo operative debridement of the central portion of the wound excision of the wound edges burring of the outer table of the skull and placement of Integra.  I believe this will give him the best opportunity for healing of the wound with a durable tissue that can later be skin grafted.  He and his daughter both understand that due to the long exposure of the skull that it may not be possible to get a vascularized wound bed and as such the Integra may not take and  he may not be a candidate for skin grafting at which time we will consider other options for repairing the defect.  We will schedule him for surgery as soon as possible  Camillia Herter 10/21/2022, 11:25 AM

## 2022-10-22 ENCOUNTER — Institutional Professional Consult (permissible substitution): Payer: Medicare HMO | Admitting: Plastic Surgery

## 2022-11-02 DIAGNOSIS — C44719 Basal cell carcinoma of skin of left lower limb, including hip: Secondary | ICD-10-CM | POA: Diagnosis not present

## 2022-11-02 DIAGNOSIS — I872 Venous insufficiency (chronic) (peripheral): Secondary | ICD-10-CM | POA: Diagnosis not present

## 2022-11-16 DIAGNOSIS — Z4801 Encounter for change or removal of surgical wound dressing: Secondary | ICD-10-CM | POA: Diagnosis not present

## 2022-11-16 DIAGNOSIS — Z48817 Encounter for surgical aftercare following surgery on the skin and subcutaneous tissue: Secondary | ICD-10-CM | POA: Diagnosis not present

## 2022-11-16 DIAGNOSIS — C44719 Basal cell carcinoma of skin of left lower limb, including hip: Secondary | ICD-10-CM | POA: Diagnosis not present

## 2022-11-16 DIAGNOSIS — I872 Venous insufficiency (chronic) (peripheral): Secondary | ICD-10-CM | POA: Diagnosis not present

## 2022-11-24 NOTE — H&P (View-Only) (Signed)
Patient ID: Sean Skinner, male    DOB: 27-Jan-1941, 81 y.o.   MRN: [id number]  Chief Complaint  Patient presents with   Pre-op Exam      ICD-10-CM   1. Open wound of scalp without complication, subsequent encounter  S01.00XD        History of Present Illness: Sean Skinner is a 81 y.o.  male  with a history of SCC scalp s/p radiation and subsequent open wound.  He presents for preoperative evaluation for upcoming procedure, debridement with possible tissue rearrangement versus placement of Integra, scheduled for 12/04/2022 with Dr.  Lovena Le .  The patient has not had problems with anesthesia.  He reports that he has never had any postoperative nausea and vomiting or other adverse effects.  He takes good care of his physical health and states that the only medication that he takes regularly is maintenance Valtrex given a history of zoster ophthalmicus.  He does not take any vitamins or over-the-counter supplements.  Does not take any aspirin containing products or other OTC meds.  NKDA.  Denies any personal or family history of blood clots or clotting disorder.  He describes having a melanoma in situ in addition to [facility] and SCC, but no other history of cancer.  Scattered varicosities.  He did have a recent Mohs excision of left lower leg 11/16/2022, ambulatory since his surgery.  Summary of Previous Visit: He was seen for initial consult by our practice 05/2020 with regard to an open scalp wound and right parietal region subsequent to [facility] excision with subsequent radiation treatments.  Options were for allowing the wound to heal via secondary intent versus attempting primary closure or skin substitute placement.  After shared decision-making, patient elected to allow to heal via secondary intent.  Unfortunately, he was then seen 10/21/2022 Dr. Lovena Le after failure of wound progression and healing.  Discussed proceeding with operative debridement and primary closure of the tissue rearrangement  versus placement of a tissue substitute.  The outer table of the skull will also have to be burred to help revascularize the wound bed.  PMH Significant for: Squamous cell carcinoma, basal cell carcinoma,   Past Medical History: Allergies: No Known Allergies  Current Medications:  Current Outpatient Medications:    valACYclovir (VALTREX) 1000 MG tablet, , Disp: , Rfl:    fish oil-omega-3 fatty acids 1000 MG capsule, Take 2 g by mouth daily., Disp: , Rfl:    gabapentin (NEURONTIN) 300 MG capsule, Take by mouth., Disp: , Rfl:    triamcinolone ointment (KENALOG) 0.1 %, Apply      small amount to affected area on scalp x 1 month, Disp: , Rfl:   Past Medical Problems: Past Medical History:  Diagnosis Date   Basal cell cancer    face,arms,neck skin    Past Surgical History: Past Surgical History:  Procedure Laterality Date   COLONOSCOPY WITH PROPOFOL N/A 03/13/2013   Procedure: COLONOSCOPY WITH PROPOFOL;  Surgeon: Garlan Fair, MD;  Location: [facility];  Service: Endoscopy;  Laterality: N/A;   Removal of skin cancer      Social History: Social History   Socioeconomic History   Marital status: Married    Spouse name: Not on file   Number of children: Not on file   Years of education: Not on file   Highest education level: Not on file  Occupational History   Not on file  Tobacco Use   Smoking status: Never   Smokeless tobacco: Former  Substance and Sexual Activity   Alcohol use: Yes   Drug use: No   Sexual activity: Not on file  Other Topics Concern   Not on file  Social History Narrative   Not on file   Social Determinants of Health   Financial Resource Strain: Not on file  Food Insecurity: Not on file  Transportation Needs: Not on file  Physical Activity: Not on file  Stress: Not on file  Social Connections: Not on file  Intimate Partner Violence: Not on file    Family History: No family history on file.  Review of Systems: ROS Denies any recent  chest pain, difficulty breathing, fevers, leg swelling.  Physical Exam: Vital Signs BP (!) 143/80 (BP Location: Left Arm, Patient Position: Sitting, Cuff Size: Large)   Pulse 76   Ht 6' 2.25" (1.886 m)   Wt 196 lb (88.9 kg)   SpO2 98%   BMI 25.00 kg/m   Physical Exam  Constitutional:      General: Not in acute distress.    Appearance: Normal appearance. Not ill-appearing.  HENT:     Head: Normocephalic and atraumatic.  Eyes:     Pupils: Pupils are equal, round. Cardiovascular:     Rate and Rhythm: Normal rate.    Pulses: Normal pulses.  Pulmonary:     Effort: No respiratory distress or increased work of breathing.  Speaks in full sentences. Abdominal:     General: Abdomen is flat. No distension.   Musculoskeletal: Normal range of motion. No lower extremity swelling or edema.  Scattered varicosities.  Recent Mohs excision left lower leg. Skin:    General: Skin is warm and dry.     Findings: No erythema or rash.  Neurological:     Mental Status: Alert and oriented to person, place, and time.  Psychiatric:        Mood and Affect: Mood normal.        Behavior: Behavior normal.    Assessment/Plan: The patient is scheduled for debridement with possible tissue rearrangement versus placement of Integra with Dr.  Lovena Le .  Risks, benefits, and alternatives of procedure discussed, questions answered and consent obtained.    Smoking Status: Non-smoker.  Caprini Score: 8; Risk Factors include: Age, hx melanoma in situ and other skin cancers, scattered varicosities, and length of planned surgery. Discussed elevated risk score with Dr. Lovena Le and will recommendation for mechanical prophylaxis only. Encourage early ambulation.   Pictures obtained: 10/21/2022.  Post-op Rx sent to pharmacy: Patient declines any postoperative nausea medications or analgesics.  Patient was provided with the General Surgical Risk consent document and Pain Medication Agreement prior to their appointment.   They had adequate time to read through the risk consent documents and Pain Medication Agreement. We also discussed them in person together during this preop appointment. All of their questions were answered to their satisfaction.  Recommended calling if they have any further questions.  Risk consent form and Pain Medication Agreement to be scanned into patient's chart.    Electronically signed by: Krista Blue, PA-C 11/25/2022 2:30 PM

## 2022-11-24 NOTE — Progress Notes (Signed)
Patient ID: Sean Skinner, male    DOB: [birth year], 81 y.o.   MRN: [id number]  No chief complaint on file.   No diagnosis found.   History of Present Illness: Sean Skinner is a 81 y.o.  male  with a history of SCC scalp s/p radiation and subsequent open wound.  He presents for preoperative evaluation for upcoming procedure, debridement with possible tissue rearrangement versus placement of Integra, scheduled for *** with Dr. {[id number]::"HYIF","OYDXAJOINO"}.  The patient {HAS HAS [id number]} had problems with anesthesia. ***  Summary of Previous Visit: ***  Job: ***  PMH Significant for: ***   Past Medical History: Allergies: No Known Allergies  Current Medications:  Current Outpatient Medications:    amoxicillin-clavulanate (AUGMENTIN) 875-125 MG tablet, Add'l Sig Add'l Sig oral Add'l Sig, Disp: , Rfl:    doxycycline (VIBRAMYCIN) 100 MG capsule, Take 100 mg by mouth 2 (two) times daily., Disp: , Rfl:    EFUDEX 5 % cream, 1 application apply on the skin twice a day  apply on the skin twice a day x 3 weeks, Disp: , Rfl:    fish oil-omega-3 fatty acids 1000 MG capsule, Take 2 g by mouth daily., Disp: , Rfl:    gabapentin (NEURONTIN) 300 MG capsule, , Disp: , Rfl:    gabapentin (NEURONTIN) 300 MG capsule, Take by mouth., Disp: , Rfl:    prednisoLONE acetate (PRED FORTE) 1 % ophthalmic suspension, , Disp: , Rfl:    triamcinolone ointment (KENALOG) 0.1 %, Apply      small amount to affected area on scalp x 1 month, Disp: , Rfl:    valACYclovir (VALTREX) 1000 MG tablet, , Disp: , Rfl:   Past Medical Problems: Past Medical History:  Diagnosis Date   Basal cell cancer    face,arms,neck skin    Past Surgical History: Past Surgical History:  Procedure Laterality Date   COLONOSCOPY WITH PROPOFOL N/A 03/13/2013   Procedure: COLONOSCOPY WITH PROPOFOL;  Surgeon: Garlan Fair, MD;  Location: [facility];  Service: Endoscopy;  Laterality: N/A;   Removal of skin cancer       Social History: Social History   Socioeconomic History   Marital status: Married    Spouse name: Not on file   Number of children: Not on file   Years of education: Not on file   Highest education level: Not on file  Occupational History   Not on file  Tobacco Use   Smoking status: Never   Smokeless tobacco: Former  Substance and Sexual Activity   Alcohol use: Yes   Drug use: No   Sexual activity: Not on file  Other Topics Concern   Not on file  Social History Narrative   Not on file   Social Determinants of Health   Financial Resource Strain: Not on file  Food Insecurity: Not on file  Transportation Needs: Not on file  Physical Activity: Not on file  Stress: Not on file  Social Connections: Not on file  Intimate Partner Violence: Not on file    Family History: No family history on file.  Review of Systems: ROS  Physical Exam: Vital Signs There were no vitals taken for this visit.  Physical Exam *** Constitutional:      General: Not in acute distress.    Appearance: Normal appearance. Not ill-appearing.  HENT:     Head: Normocephalic and atraumatic.  Eyes:     Pupils: Pupils are equal, round. Cardiovascular:     Rate  and Rhythm: Normal rate.    Pulses: Normal pulses.  Pulmonary:     Effort: No respiratory distress or increased work of breathing.  Speaks in full sentences. Abdominal:     General: Abdomen is flat. No distension.   Musculoskeletal: Normal range of motion. No lower extremity swelling or edema. No varicosities. *** Skin:    General: Skin is warm and dry.     Findings: No erythema or rash.  Neurological:     Mental Status: Alert and oriented to person, place, and time.  Psychiatric:        Mood and Affect: Mood normal.        Behavior: Behavior normal.    Assessment/Plan: The patient is scheduled for *** with Dr. Donne Hazel.  Risks, benefits, and alternatives of procedure discussed, questions  answered and consent obtained.    Smoking Status: ***; Counseling Given? *** Last Mammogram: ***; Results: ***  Caprini Score: ***; Risk Factors include: ***, BMI *** 25, and length of planned surgery. Recommendation for mechanical *** pharmacological prophylaxis. Encourage early ambulation.   Pictures obtained: ***  Post-op Rx sent to pharmacy: ***  Patient was provided with the *** General Surgical Risk consent document and Pain Medication Agreement prior to their appointment.  They had adequate time to read through the risk consent documents and Pain Medication Agreement. We also discussed them in person together during this preop appointment. All of their questions were answered to their satisfaction.  Recommended calling if they have any further questions.  Risk consent form and Pain Medication Agreement to be scanned into patient's chart.  ***   Electronically signed by: Krista Blue, PA-C 11/24/2022 9:07 AM

## 2022-11-25 ENCOUNTER — Encounter: Payer: Self-pay | Admitting: Physician Assistant

## 2022-11-25 ENCOUNTER — Ambulatory Visit (INDEPENDENT_AMBULATORY_CARE_PROVIDER_SITE_OTHER): Payer: Medicare HMO | Admitting: Physician Assistant

## 2022-11-25 VITALS — BP 143/80 | HR 76 | Ht 74.25 in | Wt 196.0 lb

## 2022-11-25 DIAGNOSIS — Z923 Personal history of irradiation: Secondary | ICD-10-CM

## 2022-11-25 DIAGNOSIS — S0100XD Unspecified open wound of scalp, subsequent encounter: Secondary | ICD-10-CM

## 2022-11-30 ENCOUNTER — Other Ambulatory Visit: Payer: Self-pay

## 2022-11-30 ENCOUNTER — Encounter (HOSPITAL_BASED_OUTPATIENT_CLINIC_OR_DEPARTMENT_OTHER): Payer: Self-pay | Admitting: Plastic Surgery

## 2022-12-04 ENCOUNTER — Ambulatory Visit (HOSPITAL_BASED_OUTPATIENT_CLINIC_OR_DEPARTMENT_OTHER): Payer: Medicare HMO | Admitting: Anesthesiology

## 2022-12-04 ENCOUNTER — Other Ambulatory Visit: Payer: Self-pay

## 2022-12-04 ENCOUNTER — Encounter (HOSPITAL_BASED_OUTPATIENT_CLINIC_OR_DEPARTMENT_OTHER)
Admission: RE | Disposition: A | Discharge status: Home / Self Care | Payer: Self-pay | Source: Home / Self Care | Attending: Plastic Surgery

## 2022-12-04 ENCOUNTER — Encounter (HOSPITAL_BASED_OUTPATIENT_CLINIC_OR_DEPARTMENT_OTHER): Payer: Self-pay | Admitting: Plastic Surgery

## 2022-12-04 ENCOUNTER — Ambulatory Visit (HOSPITAL_BASED_OUTPATIENT_CLINIC_OR_DEPARTMENT_OTHER)
Admission: RE | Admit: 2022-12-04 | Discharge: 2022-12-04 | Disposition: A | Discharge status: Home / Self Care | Payer: Medicare HMO | Attending: Plastic Surgery | Admitting: Plastic Surgery

## 2022-12-04 DIAGNOSIS — S0100XD Unspecified open wound of scalp, subsequent encounter: Secondary | ICD-10-CM | POA: Diagnosis not present

## 2022-12-04 DIAGNOSIS — M898X8 Other specified disorders of bone, other site: Secondary | ICD-10-CM

## 2022-12-04 DIAGNOSIS — S0100XA Unspecified open wound of scalp, initial encounter: Secondary | ICD-10-CM

## 2022-12-04 DIAGNOSIS — C4442 Squamous cell carcinoma of skin of scalp and neck: Secondary | ICD-10-CM | POA: Insufficient documentation

## 2022-12-04 DIAGNOSIS — X58XXXD Exposure to other specified factors, subsequent encounter: Secondary | ICD-10-CM | POA: Insufficient documentation

## 2022-12-04 HISTORY — PX: IRRIGATION AND DEBRIDEMENT OF WOUND WITH SPLIT THICKNESS SKIN GRAFT: SHX5879

## 2022-12-04 HISTORY — DX: Cystoid macular degeneration, left eye: H35.352

## 2022-12-04 HISTORY — DX: Other postherpetic nervous system involvement: B02.29

## 2022-12-04 SURGERY — IRRIGATION AND DEBRIDEMENT OF WOUND WITH SPLIT THICKNESS SKIN GRAFT
Anesthesia: General | Site: Scalp | Laterality: Right

## 2022-12-04 MED ORDER — LIDOCAINE 2% (20 MG/ML) 5 ML SYRINGE
INTRAMUSCULAR | Status: DC | PRN
  Administered 2022-12-04: 40 mg via INTRAVENOUS

## 2022-12-04 MED ORDER — ATROPINE SULFATE 0.4 MG/ML IV SOLN
INTRAVENOUS | Status: AC
  Filled 2022-12-04: qty 1

## 2022-12-04 MED ORDER — EPHEDRINE 5 MG/ML INJ
INTRAVENOUS | Status: AC
  Filled 2022-12-04: qty 5

## 2022-12-04 MED ORDER — FENTANYL CITRATE (PF) 100 MCG/2ML IJ SOLN
INTRAMUSCULAR | Status: AC
  Filled 2022-12-04: qty 2

## 2022-12-04 MED ORDER — SUCCINYLCHOLINE CHLORIDE 200 MG/10ML IV SOSY
PREFILLED_SYRINGE | INTRAVENOUS | Status: AC
  Filled 2022-12-04: qty 10

## 2022-12-04 MED ORDER — BUPIVACAINE-EPINEPHRINE (PF) 0.25% -1:200000 IJ SOLN
INTRAMUSCULAR | Status: AC
  Filled 2022-12-04: qty 30

## 2022-12-04 MED ORDER — CHLORHEXIDINE GLUCONATE CLOTH 2 % EX PADS: 6.0000 | MEDICATED_PAD | Freq: Once | CUTANEOUS | Status: DC

## 2022-12-04 MED ORDER — EPINEPHRINE PF 1 MG/ML IJ SOLN
INTRAMUSCULAR | Status: AC
  Filled 2022-12-04: qty 2

## 2022-12-04 MED ORDER — DEXAMETHASONE SODIUM PHOSPHATE 10 MG/ML IJ SOLN
INTRAMUSCULAR | Status: AC
  Filled 2022-12-04: qty 1

## 2022-12-04 MED ORDER — ACETAMINOPHEN 500 MG PO TABS
ORAL_TABLET | ORAL | Status: AC
  Filled 2022-12-04: qty 2

## 2022-12-04 MED ORDER — PHENYLEPHRINE 80 MCG/ML (10ML) SYRINGE FOR IV PUSH (FOR BLOOD PRESSURE SUPPORT)
PREFILLED_SYRINGE | INTRAVENOUS | Status: AC
  Filled 2022-12-04: qty 10

## 2022-12-04 MED ORDER — ONDANSETRON HCL 4 MG/2ML IJ SOLN
INTRAMUSCULAR | Status: DC | PRN
  Administered 2022-12-04: 4 mg via INTRAVENOUS

## 2022-12-04 MED ORDER — LACTATED RINGERS IV SOLN: INTRAVENOUS | Status: DC

## 2022-12-04 MED ORDER — PROPOFOL 10 MG/ML IV BOLUS
INTRAVENOUS | Status: DC | PRN
  Administered 2022-12-04: 200 mg via INTRAVENOUS
  Administered 2022-12-04: 50 mg via INTRAVENOUS

## 2022-12-04 MED ORDER — OXYCODONE HCL 5 MG PO TABS: 5.0000 mg | ORAL_TABLET | Freq: Once | ORAL | Status: DC | PRN

## 2022-12-04 MED ORDER — CEFAZOLIN SODIUM-DEXTROSE 2-4 GM/100ML-% IV SOLN
INTRAVENOUS | Status: AC
  Filled 2022-12-04: qty 100

## 2022-12-04 MED ORDER — FENTANYL CITRATE (PF) 100 MCG/2ML IJ SOLN: 25.0000 ug | INTRAMUSCULAR | Status: DC | PRN

## 2022-12-04 MED ORDER — ACETAMINOPHEN 500 MG PO TABS
1000.0000 mg | ORAL_TABLET | Freq: Once | ORAL | Status: AC
  Administered 2022-12-04: 1000 mg via ORAL

## 2022-12-04 MED ORDER — ONDANSETRON HCL 4 MG/2ML IJ SOLN
INTRAMUSCULAR | Status: AC
  Filled 2022-12-04: qty 2

## 2022-12-04 MED ORDER — LIDOCAINE 2% (20 MG/ML) 5 ML SYRINGE
INTRAMUSCULAR | Status: AC
  Filled 2022-12-04: qty 5

## 2022-12-04 MED ORDER — MINERAL OIL LIGHT OIL
TOPICAL_OIL | Status: AC
  Filled 2022-12-04: qty 40

## 2022-12-04 MED ORDER — MIDAZOLAM HCL 2 MG/2ML IJ SOLN
INTRAMUSCULAR | Status: AC
  Filled 2022-12-04: qty 2

## 2022-12-04 MED ORDER — BACITRACIN ZINC 500 UNIT/GM EX OINT
TOPICAL_OINTMENT | CUTANEOUS | Status: AC
  Filled 2022-12-04: qty 28.35

## 2022-12-04 MED ORDER — OXYCODONE HCL 5 MG/5ML PO SOLN: 5.0000 mg | Freq: Once | ORAL | Status: DC | PRN

## 2022-12-04 MED ORDER — BUPIVACAINE-EPINEPHRINE 0.25% -1:200000 IJ SOLN
INTRAMUSCULAR | Status: DC | PRN
  Administered 2022-12-04: 6 mL

## 2022-12-04 MED ORDER — DEXAMETHASONE SODIUM PHOSPHATE 4 MG/ML IJ SOLN
INTRAMUSCULAR | Status: DC | PRN
  Administered 2022-12-04: 5 mg via INTRAVENOUS

## 2022-12-04 MED ORDER — CEFAZOLIN SODIUM-DEXTROSE 2-4 GM/100ML-% IV SOLN
2.0000 g | INTRAVENOUS | Status: AC
  Administered 2022-12-04: 2 g via INTRAVENOUS

## 2022-12-04 MED ORDER — FENTANYL CITRATE (PF) 100 MCG/2ML IJ SOLN
INTRAMUSCULAR | Status: DC | PRN
  Administered 2022-12-04 (×2): 50 ug via INTRAVENOUS

## 2022-12-04 SURGICAL SUPPLY — 71 items
ADH SKN CLS APL DERMABOND .7 (GAUZE/BANDAGES/DRESSINGS)
APL SKNCLS STERI-STRIP NONHPOA (GAUZE/BANDAGES/DRESSINGS)
BAG DECANTER FOR FLEXI CONT (MISCELLANEOUS) IMPLANT
BALL CTTN LRG ABS STRL LF (GAUZE/BANDAGES/DRESSINGS) ×3
BENZOIN TINCTURE PRP APPL 2/3 (GAUZE/BANDAGES/DRESSINGS) IMPLANT
BLADE CLIPPER SURG (BLADE) IMPLANT
BLADE DERMATOME SS (BLADE) IMPLANT
BLADE HEX COATED 2.75 (ELECTRODE) IMPLANT
BLADE SURG 10 STRL SS (BLADE) IMPLANT
BLADE SURG 15 STRL LF DISP TIS (BLADE) ×1 IMPLANT
BLADE SURG 15 STRL SS (BLADE) ×1
BNDG CMPR 5X4 CHSV STRCH STRL (GAUZE/BANDAGES/DRESSINGS)
BNDG COHESIVE 4X5 TAN STRL LF (GAUZE/BANDAGES/DRESSINGS) IMPLANT
BNDG ELASTIC 4X5.8 VLCR STR LF (GAUZE/BANDAGES/DRESSINGS) IMPLANT
CANISTER SUCT 1200ML W/VALVE (MISCELLANEOUS) IMPLANT
COTTONBALL LRG STERILE PKG (GAUZE/BANDAGES/DRESSINGS) ×2 IMPLANT
COVER BACK TABLE 60X90IN (DRAPES) ×1 IMPLANT
COVER MAYO STAND STRL (DRAPES) ×1 IMPLANT
DEPRESSOR TONGUE 6 IN STERILE (GAUZE/BANDAGES/DRESSINGS) ×1 IMPLANT
DERMABOND ADVANCED .7 DNX12 (GAUZE/BANDAGES/DRESSINGS) IMPLANT
DERMACARRIERS GRAFT 1 TO 1.5 (DISPOSABLE)
DRAPE LAPAROTOMY 100X72 PEDS (DRAPES) IMPLANT
DRAPE U-SHAPE 76X120 STRL (DRAPES) ×1 IMPLANT
DRAPE UTILITY XL STRL (DRAPES) IMPLANT
DRESSING MEPILEX FLEX 4X4 (GAUZE/BANDAGES/DRESSINGS) IMPLANT
DRSG CALCIUM ALGINATE 4X4 (GAUZE/BANDAGES/DRESSINGS) IMPLANT
DRSG MEPILEX FLEX 4X4 (GAUZE/BANDAGES/DRESSINGS) ×3
DRSG TEGADERM 4X10 (GAUZE/BANDAGES/DRESSINGS) IMPLANT
ELECT REM PT RETURN 9FT ADLT (ELECTROSURGICAL) ×1
ELECTRODE REM PT RTRN 9FT ADLT (ELECTROSURGICAL) IMPLANT
GAUZE 4X4 16PLY ~~LOC~~+RFID DBL (SPONGE) IMPLANT
GAUZE PAD ABD 8X10 STRL (GAUZE/BANDAGES/DRESSINGS) IMPLANT
GAUZE SPONGE 4X4 12PLY STRL (GAUZE/BANDAGES/DRESSINGS) ×2 IMPLANT
GAUZE XEROFORM 5X9 LF (GAUZE/BANDAGES/DRESSINGS) ×1 IMPLANT
GLOVE BIO SURGEON STRL SZ8 (GLOVE) ×1 IMPLANT
GLOVE BIOGEL M STRL SZ7.5 (GLOVE) ×1 IMPLANT
GLOVE BIOGEL PI IND STRL 7.5 (GLOVE) ×1 IMPLANT
GOWN STRL REUS W/ TWL LRG LVL3 (GOWN DISPOSABLE) ×1 IMPLANT
GOWN STRL REUS W/TWL LRG LVL3 (GOWN DISPOSABLE) ×1
GRAFT DERMACARRIERS 1 TO 1.5 (DISPOSABLE) IMPLANT
MATRIX TISSUE MESHED 4X5 (Tissue) IMPLANT
MATRIX WOUND MESHED 2X2 (Tissue) IMPLANT
NDL HYPO 27GX1-1/4 (NEEDLE) ×1 IMPLANT
NEEDLE HYPO 27GX1-1/4 (NEEDLE) ×1 IMPLANT
NS IRRIG 1000ML POUR BTL (IV SOLUTION) ×1 IMPLANT
PACK BASIN DAY SURGERY FS (CUSTOM PROCEDURE TRAY) ×1 IMPLANT
PAD CAST 4YDX4 CTTN HI CHSV (CAST SUPPLIES) IMPLANT
PADDING CAST COTTON 4X4 STRL (CAST SUPPLIES)
PENCIL SMOKE EVACUATOR (MISCELLANEOUS) IMPLANT
SHEET MEDIUM DRAPE 40X70 STRL (DRAPES) IMPLANT
SPIKE FLUID TRANSFER (MISCELLANEOUS) IMPLANT
STRIP CLOSURE SKIN 1/2X4 (GAUZE/BANDAGES/DRESSINGS) IMPLANT
SUCTION FRAZIER HANDLE 10FR (MISCELLANEOUS)
SUCTION TUBE FRAZIER 10FR DISP (MISCELLANEOUS) IMPLANT
SUT CHROMIC 5 0 P 3 (SUTURE) IMPLANT
SUT ETHILON 3 0 PS 1 (SUTURE) IMPLANT
SUT ETHILON 4 0 P 3 18 (SUTURE) IMPLANT
SUT MON AB 4-0 PC3 18 (SUTURE) IMPLANT
SUT MON AB 5-0 PS2 18 (SUTURE) IMPLANT
SUT PROLENE 4 0 P 3 18 (SUTURE) IMPLANT
SUT SILK 4 0 PS 2 (SUTURE) IMPLANT
SUT VIC AB 3-0 FS2 27 (SUTURE) IMPLANT
SUT VICRYL 4-0 PS2 18IN ABS (SUTURE) IMPLANT
SYR BULB EAR ULCER 3OZ GRN STR (SYRINGE) ×1 IMPLANT
SYR CONTROL 10ML LL (SYRINGE) ×1 IMPLANT
TOWEL GREEN STERILE FF (TOWEL DISPOSABLE) ×1 IMPLANT
TRAY DSU PREP LF (CUSTOM PROCEDURE TRAY) ×1 IMPLANT
TUBE CONNECTING 20X1/4 (TUBING) IMPLANT
UNDERPAD 30X36 HEAVY ABSORB (UNDERPADS AND DIAPERS) IMPLANT
WOUND MATRIX MESHED 2X2 (Tissue) IMPLANT
YANKAUER SUCT BULB TIP NO VENT (SUCTIONS) IMPLANT

## 2022-12-04 NOTE — Discharge Instructions (Addendum)
Postoperative Instructions -Do NOT get the surgical site / bolster wet for the next 2 weeks -Change tan mepilex border dressing every 2 days. Otherwise, keep mepilex border dressing in place.   Diet: Regular, Try to optimize nutrition with plenty of fruits and vegetables to improve healing  Call doctor if any unusual problems occur such as pain, excessive bleeding, unrelieved nausea/vomiting, fever &/or chills   Post Anesthesia Home Care Instructions  Activity: Get plenty of rest for the remainder of the day. A responsible individual must stay with you for 24 hours following the procedure.  For the next 24 hours, DO NOT: -Drive a car -Paediatric nurse -Drink alcoholic beverages -Take any medication unless instructed by your physician -Make any legal decisions or sign important papers.  Meals: Start with liquid foods such as gelatin or soup. Progress to regular foods as tolerated. Avoid greasy, spicy, heavy foods. If nausea and/or vomiting occur, drink only clear liquids until the nausea and/or vomiting subsides. Call your physician if vomiting continues.  Special Instructions/Symptoms: Your throat may feel dry or sore from the anesthesia or the breathing tube placed in your throat during surgery. If this causes discomfort, gargle with warm salt water. The discomfort should disappear within 24 hours.  If you had a scopolamine patch placed behind your ear for the management of post- operative nausea and/or vomiting:  1. The medication in the patch is effective for 72 hours, after which it should be removed.  Wrap patch in a tissue and discard in the trash. Wash hands thoroughly with soap and water. 2. You may remove the patch earlier than 72 hours if you experience unpleasant side effects which may include dry mouth, dizziness or visual disturbances. 3. Avoid touching the patch. Wash your hands with soap and water after contact with the patch.    *May have Tylenol at 4:40pm today  12/04/22

## 2022-12-04 NOTE — Op Note (Signed)
DATE OF OPERATION: 12/04/2022  LOCATION: [facility] operating Room  PREOPERATIVE DIAGNOSIS: Open scalp wound with exposed calvarium, history of squamous cell carcinoma.  POSTOPERATIVE DIAGNOSIS: Same  PROCEDURE: Biopsy of periphery of wound, debridement of wound, burring of calvarium, placement of Integra tissue matrix.  Wound size 5 x 5 cm.  SURGEON: Jeanann Lewandowsky, MD  ASSISTANT: Donnamarie Rossetti  EBL: 10 mL cc  CONDITION: Stable  COMPLICATIONS: None  INDICATION: The patient, Sean Skinner, is a 81 y.o. male born on [birth year], is here for treatment of an open scalp wound.   PROCEDURE DETAILS:  The patient was seen prior to surgery and marked.   IV antibiotics were given. The patient was taken to the operating room and given a general anesthetic. A standard time out was performed and all information was confirmed by those in the room. SCDs were placed.   The wound was prepped and draped in usual sterile fashion.  The periphery of the wound was excised and marked anterior posterior and lateral and sent to pathology for routine examination.  The base of the wound was irrigated and loose debris scraped off with a 15 blade scalpel.  There was desiccated bone in the center of the wound.  This was burred down with a diamond bur.  I was able to get punctate bleeding over most of the wound however there was an area approximately 1 x 1 cm in the center of the wound in which I could not get any bleeding.  I elected to proceed with placement of Integra over the wound.  A 6 x 6 portion of Integra was trimmed to fit the wound the correct side was confirmed.  The Integra was sutured to the skin edges with interrupted 3-0 Prolene sutures.  The Integra was held in place with a bolster consisting of Adaptic with mineral oil soaked cotton balls in the center.  The bolster was held in place with nylon sutures.  The entire wound was covered with a Mepilex border. The patient was allowed to wake up and taken  to recovery room in stable condition at the end of the case. The family was notified at the end of the case.  All instrument needle and sponge counts were reported as correct  The advanced practice practitioner (APP),Ms Donnamarie Rossetti, assisted throughout the case.  The APP was essential in retraction and counter traction when needed to make the case progress smoothly.  This retraction and assistance made it possible to see the tissue plans for the procedure.  The assistance was needed for blood control, tissue re-approximation and assisted with closure of the incision site.

## 2022-12-04 NOTE — Interval H&P Note (Signed)
History and Physical Interval Note: Met Mr Simar and his wife in the pro op holding area. No change in physical exam and no change in indication for surgery.  Will proceed with scalp wound debridement and placement of Integra at his request  12/04/2022 11:40 AM  Naitik P Vetere  has presented today for surgery, with the diagnosis of Open wound of scalp without complication.  The various methods of treatment have been discussed with the patient and family. After consideration of risks, benefits and other options for treatment, the patient has consented to  Procedure(s): operative debridement and preparation of 5X5 cm scalp wound, possible local tissue rearrangement, possible Integra placement, possible skin graft placement, possible wound vac (Right) ADJACENT TISSUE TRANSFER/TISSUE REARRANGEMENT (Right) APPLICATION OF SKIN SUBSTITUTE (Right) APPLICATION OF WOUND VAC (Right) as a surgical intervention.  The patient's history has been reviewed, patient examined, no change in status, stable for surgery.  I have reviewed the patient's chart and labs.  Questions were answered to the patient's satisfaction.      B    

## 2022-12-04 NOTE — Anesthesia Postprocedure Evaluation (Signed)
Anesthesia Post Note  Patient: Sean Skinner  Procedure(s) Performed: Operative debridement and preparation of 5X5 cm scalp wound, Integra placement (Right: Scalp) APPLICATION OF SKIN SUBSTITUTE (Right: Scalp)     Patient location during evaluation: Phase II Anesthesia Type: General Level of consciousness: awake and alert, patient cooperative and oriented Pain management: pain level controlled Vital Signs Assessment: post-procedure vital signs reviewed and stable Respiratory status: spontaneous breathing, nonlabored ventilation and respiratory function stable Cardiovascular status: blood pressure returned to baseline and stable Postop Assessment: no apparent nausea or vomiting, able to ambulate and adequate PO intake Anesthetic complications: no   No notable events documented.  Last Vitals:  Vitals:   12/04/22 1345 12/04/22 1400  BP: (!) 141/86 (!) 136/95  Pulse: 82 83  Resp: 15 13  Temp:    SpO2: 98% 96%    Last Pain:  Vitals:   12/04/22 1400  TempSrc:   PainSc: 0-No pain                 ,E. 

## 2022-12-04 NOTE — Anesthesia Preprocedure Evaluation (Addendum)
Anesthesia Evaluation  Patient identified by MRN, date of birth, ID band Patient awake    Reviewed: Allergy & Precautions, NPO status , Patient's Chart, lab work & pertinent test results  History of Anesthesia Complications Negative for: history of anesthetic complications  Airway Mallampati: I  TM Distance: >3 FB Neck ROM: Full    Dental  (+) Partial Upper, Caps, Dental Advisory Given   Pulmonary neg pulmonary ROS   breath sounds clear to auscultation       Cardiovascular negative cardio ROS  Rhythm:Regular Rate:Normal     Neuro/Psych negative neurological ROS     GI/Hepatic negative GI ROS, Neg liver ROS,,,  Endo/Other  negative endocrine ROS    Renal/GU negative Renal ROS     Musculoskeletal   Abdominal   Peds  Hematology negative hematology ROS (+)   Anesthesia Other Findings Basal cell carcinoma  Reproductive/Obstetrics                             Anesthesia Physical Anesthesia Plan  ASA: 2  Anesthesia Plan: General   Post-op Pain Management: Tylenol PO (pre-op)*   Induction: Intravenous  PONV Risk Score and Plan: 2 and Ondansetron and Dexamethasone  Airway Management Planned: LMA  Additional Equipment: None  Intra-op Plan:   Post-operative Plan:   Informed Consent: I have reviewed the patients History and Physical, chart, labs and discussed the procedure including the risks, benefits and alternatives for the proposed anesthesia with the patient or authorized representative who has indicated his/her understanding and acceptance.     Dental advisory given  Plan Discussed with: CRNA and Surgeon  Anesthesia Plan Comments:         Anesthesia Quick Evaluation

## 2022-12-04 NOTE — Transfer of Care (Signed)
Immediate Anesthesia Transfer of Care Note  Patient: Sean Skinner  Procedure(s) Performed: Operative debridement and preparation of 5X5 cm scalp wound, Integra placement (Right: Scalp) APPLICATION OF SKIN SUBSTITUTE (Right: Scalp)  Patient Location: PACU  Anesthesia Type:General  Level of Consciousness: sedated  Airway & Oxygen Therapy: Patient Spontanous Breathing and Patient connected to face mask oxygen  Post-op Assessment: Report given to RN and Post -op Vital signs reviewed and stable  Post vital signs: Reviewed and stable  Last Vitals:  Vitals Value Taken Time  BP    Temp    Pulse 96 12/04/22 1331  Resp 17 12/04/22 1331  SpO2 95 % 12/04/22 1331  Vitals shown include unvalidated device data.  Last Pain:  Vitals:   12/04/22 1034  TempSrc: Oral  PainSc: 1       Patients Stated Pain Goal: 1 (85/46/27 0350)  Complications: No notable events documented.

## 2022-12-04 NOTE — Anesthesia Procedure Notes (Signed)
Procedure Name: LMA Insertion Date/Time: 12/04/2022 12:07 PM  Performed by: Willa Frater, CRNAPre-anesthesia Checklist: Patient identified, Emergency Drugs available, Suction available and Patient being monitored Patient Re-evaluated:Patient Re-evaluated prior to induction Oxygen Delivery Method: Circle system utilized Preoxygenation: Pre-oxygenation with 100% oxygen Induction Type: IV induction Ventilation: Mask ventilation without difficulty LMA: LMA inserted LMA Size: 5.0 Number of attempts: 1 Airway Equipment and Method: Bite block Placement Confirmation: positive ETCO2 Tube secured with: Tape Dental Injury: Teeth and Oropharynx as per pre-operative assessment

## 2022-12-05 ENCOUNTER — Encounter (HOSPITAL_BASED_OUTPATIENT_CLINIC_OR_DEPARTMENT_OTHER): Payer: Self-pay | Admitting: Plastic Surgery

## 2022-12-07 ENCOUNTER — Encounter: Payer: Self-pay | Admitting: Plastic Surgery

## 2022-12-07 ENCOUNTER — Ambulatory Visit (INDEPENDENT_AMBULATORY_CARE_PROVIDER_SITE_OTHER): Payer: Medicare HMO | Admitting: Plastic Surgery

## 2022-12-07 VITALS — BP 172/78 | HR 64

## 2022-12-07 DIAGNOSIS — S0100XD Unspecified open wound of scalp, subsequent encounter: Secondary | ICD-10-CM

## 2022-12-07 LAB — SURGICAL PATHOLOGY

## 2022-12-07 NOTE — Progress Notes (Signed)
Notified by Mr. Chewning daughter this weekend that the bolster had fallen off when he changed his dressing.  Patient was instructed to place a moist dressing on the Integra and to follow-up with me this morning.  On evaluation this morning the bolster was still in place the Integra looked healthy.  I changed his outer dressing and discussed with he and his wife the care needed for the dressing.  Doing well postoperatively anticipate removal and evaluation of the Integra on December 19 at his next clinic appointment.

## 2022-12-16 ENCOUNTER — Encounter: Payer: Self-pay | Admitting: Plastic Surgery

## 2022-12-16 ENCOUNTER — Ambulatory Visit (INDEPENDENT_AMBULATORY_CARE_PROVIDER_SITE_OTHER): Payer: Medicare HMO | Admitting: Plastic Surgery

## 2022-12-16 VITALS — BP 146/87 | HR 80

## 2022-12-16 DIAGNOSIS — S0100XD Unspecified open wound of scalp, subsequent encounter: Secondary | ICD-10-CM

## 2022-12-16 DIAGNOSIS — D044 Carcinoma in situ of skin of scalp and neck: Secondary | ICD-10-CM

## 2022-12-16 NOTE — Progress Notes (Signed)
Sean Skinner returns for his postoperative visit after wound preparation, bone burring, placement of Integra.  He has no specific complaints today.  I took the bolster off and the silicone off of the Integra.  He had probably 25% take of the Integra but there was no incorporation over the bone that appeared devascularized during surgery.  I cleaned the site with saline and redressed it with a surgical lube moistened gauze silicone border dressing.  His pathology returned squamous cell carcinoma and all tissue excised.    SURGICAL PATHOLOGY Surgical pathology Collected: 12/04/22 1236  Lab status: Final-Edited  Resulting lab: [facility]  Value: SURGICAL PATHOLOGY CASE: [id number] PATIENT: Sean Skinner Surgical Pathology Report     Clinical History: open wound of scalp without complication (cm)     FINAL MICROSCOPIC DIAGNOSIS:  A. SKIN, SCALP, RE-EXCISION: -  Invasive well differentiated squamous cell carcinoma, margins widely positive (i.e. anterolateral; lateral posterior; posterior medial anterior; and deep).  B. SKIN, SCALP ADDITIONAL ANTERIOR MARGIN, EXCISION: -  Invasive well differentiated squamous cell carcinoma, margin positive.   GROSS DESCRIPTION:  A.  Specimen: Received fresh is an ellipse of skin oriented with "short suture anterior, long lateral, double posterior" Size: 3.5 x 2.5 cm (A-P x M-L) and excised to a depth of 0.3 cm Inking scheme: For inking purposes anterior will be designated 12    12-3 (anterior-lateral) = blue    3-6 (lateral-posterior) = orange    06-06-11 (posterior-medial-anterior) = green    Deep = red Cutaneous surface: Tan and wrinkled without a discrete lesion.  There is a centrally located defect measuring 2.9 x 2.0 cm, consistent with a reexcision.  The cutaneous surface surrounding at the level of the defect is inked yellow. Cut surface(s): Homogenous tan cut surfaces without distinct lesions. Block Summary: A1  = 12-3 (anterior-lateral) A2 = 3-6 (lateral-posterior) A3 = 6-9 (posterior-medial) A4 = 9-12 (medial-anterior)  B.  Received fresh is "additional anterior margin.  The specimen consists of an irregularly shaped portion of skin measuring 1.5 x 0.3 cm excised to depth of 0.5 cm.  The resection margin is inked blue and the specimen is serially sectioned revealing an underlying tan cut surface. The specimen is entirely submitted in 1 block. (KW, 12/04/2022)   Final Diagnosis performed by Tilford Pillar DO.   Electronically signed 12/07/2022 Technical component performed at [employer]. [facility], [place] [address] [place], [place] [address].  Professional component performed at [facility], [place] [address] [place], [place] [address].  Immunohistochemistry Technical component (if applicable) was performed at [facility]. [address], [place], [place], [place] [address].   IMMUNOHISTOCHEMISTRY DISCLAIMER (if applicable): Some of these immunohistochemical stains may have been developed and the performance characteristics determine by [facility]. Some may not have been cleared or approved by the U.S. Food and Drug Administration. The FDA has determined that such clearance or approval is not necessary. This test is used for clinical purposes. It should not be regarded as investigational or for research. This laboratory is certified under the [place] (CLIA-88) as qualified to perform high complexity clinical laboratory testing.  The controls stained appropriately.   Results Surgical pathology (Order [id number]) MyChart Results Release  MyChart Status: Pending  Results Release   Surgical pathology Order: [id number] Status: Edited Result - FINAL     Visible to patient: No (inaccessible in MyChart)     Next appt: 12/31/2022 at 08:45 AM in Plastic  Surgery Krista Blue, PA-C)   0 Result  Notes    Component 12 d ago  SURGICAL PATHOLOGY SURGICAL PATHOLOGY CASE: [phone] PATIENT: Sean Skinner Surgical Pathology Report     Clinical History: open wound of scalp without complication (cm)     FINAL MICROSCOPIC DIAGNOSIS:  A. SKIN, SCALP, RE-EXCISION: -  Invasive well differentiated squamous cell carcinoma, margins widely positive (i.e. anterolateral; lateral posterior; posterior medial anterior; and deep).  B. SKIN, SCALP ADDITIONAL ANTERIOR MARGIN, EXCISION: -  Invasive well differentiated squamous cell carcinoma, margin positive.   GROSS DESCRIPTION:  A.  Specimen: Received fresh is an ellipse of skin oriented with "short suture anterior, long lateral, double posterior" Size: 3.5 x 2.5 cm (A-P x M-L) and excised to a depth of 0.3 cm Inking scheme: For inking purposes anterior will be designated 12    12-3 (anterior-lateral) = blue    3-6 (lateral-posterior) = orange    06-06-11 (posterior-medial-anterior) = green    Deep = red Cutaneous surface: Tan and wrinkled without a discrete lesion.  There is a centrally located defect measuring 2.9 x 2.0 cm, consistent with a reexcision.  The cutaneous surface surrounding at the level of the defect is inked yellow. Cut surface(s): Homogenous tan cut surfaces without distinct lesions. Block Summary: A1 = 12-3 (anterior-lateral) A2 = 3-6 (lateral-posterior) A3 = 6-9 (posterior-medial) A4 = 9-12 (medial-anterior)  B.  Received fresh is "additional anterior margin.  The specimen consists of an irregularly shaped portion of skin measuring 1.5 x 0.3 cm excised to depth of 0.5 cm.  The resection margin is inked blue and the specimen is serially sectioned revealing an underlying tan cut surface. The specimen is entirely submitted in 1 block. (KW, 12/04/2022)   Final Diagnosis performed by Tilford Pillar DO.   Electronically signed 12/07/2022 Technical component performed at [employer]. [facility],  [place] [address] [place], [place] [address].  Professional component performed at [facility], [place] [address], [place], [place] [address].  Immunohistochemistry Technical component (if applicable) was performed at [facility]. [address] [place], [place], [place] [address].   IMMUNOHISTOCHEMISTRY DISCLAIMER (if applicable): Some of these immunohistochemical stains may have been developed and the performance characteristics determine by [facility]. Some may not have been cleared or approved by the U.S. Food and Drug Administration. The FDA has determined that such clearance or approval is not necessary. This test is used for clinical purposes. It should not be regarded as investigational or for research. This laboratory is certified under the [place] (CLIA-88) as qualified to perform high complexity clinical laboratory testing.  The controls stained appropriately.  Resulting Agency [facility]         Specimen Collected: 12/04/22 12:36 Last Resulted: 12/07/22 14:34      Lab Flowsheet      Order Details      View Encounter      Lab and Collection Details      Routing      Result History    View All Conversations on this Encounter        Linked Documents  View Image    Result Care Coordination   Patient Communication   Add Comments   Not seen Back to Top       In Basket Actions  Done Result Mgmt   View in In [place] Surgical pathology Order: [id number] Status: Edited Result - FINAL     Visible to  patient: No (inaccessible in MyChart)     Next appt: 12/31/2022 at 08:45 AM in Plastic Surgery Krista Blue, PA-C)   0 Result Notes    Component 12 d ago  SURGICAL PATHOLOGY SURGICAL PATHOLOGY CASE: [id number] PATIENT: Sean Skinner Surgical Pathology Report     Clinical History: open wound of scalp without complication (cm)     FINAL MICROSCOPIC  DIAGNOSIS:  A. SKIN, SCALP, RE-EXCISION: -  Invasive well differentiated squamous cell carcinoma, margins widely positive (i.e. anterolateral; lateral posterior; posterior medial anterior; and deep).  B. SKIN, SCALP ADDITIONAL ANTERIOR MARGIN, EXCISION: -  Invasive well differentiated squamous cell carcinoma, margin positive.   GROSS DESCRIPTION:  A.  Specimen: Received fresh is an ellipse of skin oriented with "short suture anterior, long lateral, double posterior" Size: 3.5 x 2.5 cm (A-P x M-L) and excised to a depth of 0.3 cm Inking scheme: For inking purposes anterior will be designated 12    12-3 (anterior-lateral) = blue    3-6 (lateral-posterior) = orange    06-06-11 (posterior-medial-anterior) = green    Deep = red Cutaneous surface: Tan and wrinkled without a discrete lesion.  There is a centrally located defect measuring 2.9 x 2.0 cm, consistent with a reexcision.  The cutaneous surface surrounding at the level of the defect is inked yellow. Cut surface(s): Homogenous tan cut surfaces without distinct lesions. Block Summary: A1 = 12-3 (anterior-lateral) A2 = 3-6 (lateral-posterior) A3 = 6-9 (posterior-medial) A4 = 9-12 (medial-anterior)  B.  Received fresh is "additional anterior margin.  The specimen consists of an irregularly shaped portion of skin measuring 1.5 x 0.3 cm excised to depth of 0.5 cm.  The resection margin is inked blue and the specimen is serially sectioned revealing an underlying tan cut surface. The specimen is entirely submitted in 1 block. (KW, 12/04/2022)   Final Diagnosis performed by Tilford Pillar DO.   Electronically signed 12/07/2022 Technical component performed at [employer]. [facility] [place] [address], [place], [place] [address].  Professional component performed at [facility], [place] [address], [place], [place] [address].  Immunohistochemistry Technical component (if applicable) was performed at [facility]. [address], [place], [place], [place] [address].   IMMUNOHISTOCHEMISTRY DISCLAIMER (if applicable): Some of these immunohistochemical stains may have been developed and the performance characteristics determine by [facility]. Some may not have been cleared or approved by the U.S. Food and Drug Administration. The FDA has determined that such clearance or approval is not necessary. This test is used for clinical purposes. It should not be regarded as investigational or for research. This laboratory is certified under the [place] (CLIA-88) as qualified to perform high complexity clinical laboratory testing.  The controls stained appropriately.  Resulting Agency [facility]     I discussed the results with Mr. Sciulli and his wife.  They understand that he will need additional excision possibly with radiation therapy.  He is scheduled to see Dr. Winifred Olive on January 2. I will coordinate additional care with Dr. Winifred Olive.  I did discuss with Mr. Mcmahill the fact that if he requires extensive resection for the positive margins that he may require free tissue transfer which will necessitate his care being transferred to [place], [facility] or [facility].

## 2022-12-29 DIAGNOSIS — Z08 Encounter for follow-up examination after completed treatment for malignant neoplasm: Secondary | ICD-10-CM | POA: Diagnosis not present

## 2022-12-29 DIAGNOSIS — C44329 Squamous cell carcinoma of skin of other parts of face: Secondary | ICD-10-CM | POA: Diagnosis not present

## 2022-12-29 DIAGNOSIS — C44729 Squamous cell carcinoma of skin of left lower limb, including hip: Secondary | ICD-10-CM | POA: Diagnosis not present

## 2022-12-29 DIAGNOSIS — D485 Neoplasm of uncertain behavior of skin: Secondary | ICD-10-CM | POA: Diagnosis not present

## 2022-12-29 DIAGNOSIS — C44712 Basal cell carcinoma of skin of right lower limb, including hip: Secondary | ICD-10-CM | POA: Diagnosis not present

## 2022-12-29 DIAGNOSIS — C44622 Squamous cell carcinoma of skin of right upper limb, including shoulder: Secondary | ICD-10-CM | POA: Diagnosis not present

## 2022-12-29 DIAGNOSIS — C44229 Squamous cell carcinoma of skin of left ear and external auricular canal: Secondary | ICD-10-CM | POA: Diagnosis not present

## 2022-12-29 DIAGNOSIS — Z85828 Personal history of other malignant neoplasm of skin: Secondary | ICD-10-CM | POA: Diagnosis not present

## 2022-12-29 DIAGNOSIS — C4442 Squamous cell carcinoma of skin of scalp and neck: Secondary | ICD-10-CM | POA: Diagnosis not present

## 2022-12-29 DIAGNOSIS — C44629 Squamous cell carcinoma of skin of left upper limb, including shoulder: Secondary | ICD-10-CM | POA: Diagnosis not present

## 2022-12-31 ENCOUNTER — Ambulatory Visit (INDEPENDENT_AMBULATORY_CARE_PROVIDER_SITE_OTHER): Payer: Medicare HMO | Admitting: Physician Assistant

## 2022-12-31 ENCOUNTER — Encounter: Payer: Self-pay | Admitting: Physician Assistant

## 2022-12-31 VITALS — BP 156/83 | HR 84

## 2022-12-31 DIAGNOSIS — S0100XD Unspecified open wound of scalp, subsequent encounter: Secondary | ICD-10-CM

## 2022-12-31 NOTE — Progress Notes (Signed)
Patient is an 82 year old male with PMH of SCC on scalp with radiation and subsequent open wound who is now s/p operative debridement with biopsy of peripheral wound, burring of calvarium, and placement of Integra performed 12/04/2022 Dr. Lovena Le who presents to clinic for postoperative follow-up.  He was last seen here in clinic on 12/16/2022.  The bolster as well as the silicone of the Integra was removed.  Approximately 25% of the Integra, but no incorporation was noted over the bone that appeared devascularized at time of surgery.  Pathology had returned and notable for invasive well-differentiated SCC with positive margins.  Today, patient is doing okay.  He understands that there is additional cancer that needs to be excised.  He states that he had spoken with Dr. Winifred Olive who felt as though additional workup/imaging may also be indicated.  On exam, wound is now approximately 4 x 4.5 cm.  There are some small areas of granulation seen underneath the overlying slough.  No evidence concerning for infection.  Dr. Lovena Le also evaluated patient at bedside.  He had discussed with Dr. Winifred Olive plans for imaging.  Will place order for CT head and neck with contrast cancer staging.  Will also place referral to Dr. Joaquim Lai Collicio in [place].    He will call the clinic if he does not hear anything about imaging in the next week.  Follow-up as needed.  Picture(s) obtained of the patient and placed in the chart were with the patient's or guardian's permission.

## 2023-01-06 ENCOUNTER — Other Ambulatory Visit: Payer: Self-pay | Admitting: Physician Assistant

## 2023-01-06 DIAGNOSIS — S0100XD Unspecified open wound of scalp, subsequent encounter: Secondary | ICD-10-CM

## 2023-01-06 NOTE — Progress Notes (Signed)
Spoke Radiology Scheduling and they will try to schedule his CT head and neck w/ contrast for [facility] staging early next week.  Given that there are no labs available to review in Epic, will place order for BMP to assess renal function prior to contrast.  No listed contrast allergies.  Will call patient to notify.

## 2023-01-07 DIAGNOSIS — R17 Unspecified jaundice: Secondary | ICD-10-CM | POA: Diagnosis not present

## 2023-01-07 DIAGNOSIS — Z85828 Personal history of other malignant neoplasm of skin: Secondary | ICD-10-CM | POA: Diagnosis not present

## 2023-01-07 DIAGNOSIS — D649 Anemia, unspecified: Secondary | ICD-10-CM | POA: Diagnosis not present

## 2023-01-07 DIAGNOSIS — Z0000 Encounter for general adult medical examination without abnormal findings: Secondary | ICD-10-CM | POA: Diagnosis not present

## 2023-01-07 DIAGNOSIS — T8189XS Other complications of procedures, not elsewhere classified, sequela: Secondary | ICD-10-CM | POA: Diagnosis not present

## 2023-01-07 DIAGNOSIS — B0229 Other postherpetic nervous system involvement: Secondary | ICD-10-CM | POA: Diagnosis not present

## 2023-01-12 ENCOUNTER — Telehealth: Payer: Self-pay

## 2023-01-12 ENCOUNTER — Ambulatory Visit (HOSPITAL_COMMUNITY): Admission: RE | Admit: 2023-01-12 | Payer: Medicare HMO | Source: Ambulatory Visit

## 2023-01-12 ENCOUNTER — Ambulatory Visit (HOSPITAL_COMMUNITY): Payer: Medicare HMO

## 2023-01-12 DIAGNOSIS — D485 Neoplasm of uncertain behavior of skin: Secondary | ICD-10-CM | POA: Diagnosis not present

## 2023-01-12 NOTE — Telephone Encounter (Addendum)
[phone] for Cpt code [phone] no PA required [phone] for Cpt code [phone] pending for review  Faxed clinicals to [phone]

## 2023-01-13 ENCOUNTER — Telehealth: Payer: Self-pay

## 2023-01-13 NOTE — Telephone Encounter (Signed)
Verified both CPT codes [phone] and [id number] are PA [id number] good from 1/22-02/22/24

## 2023-01-18 ENCOUNTER — Ambulatory Visit (HOSPITAL_COMMUNITY): Payer: Medicare HMO

## 2023-01-18 ENCOUNTER — Other Ambulatory Visit: Payer: Self-pay | Admitting: Student

## 2023-01-18 ENCOUNTER — Ambulatory Visit (HOSPITAL_COMMUNITY)
Admission: RE | Admit: 2023-01-18 | Discharge: 2023-01-18 | Disposition: A | Discharge status: Home / Self Care | Payer: Medicare HMO | Source: Ambulatory Visit | Attending: Physician Assistant | Admitting: Physician Assistant

## 2023-01-18 DIAGNOSIS — S0100XA Unspecified open wound of scalp, initial encounter: Secondary | ICD-10-CM | POA: Diagnosis not present

## 2023-01-18 DIAGNOSIS — J3489 Other specified disorders of nose and nasal sinuses: Secondary | ICD-10-CM | POA: Insufficient documentation

## 2023-01-18 DIAGNOSIS — D044 Carcinoma in situ of skin of scalp and neck: Secondary | ICD-10-CM | POA: Diagnosis not present

## 2023-01-18 DIAGNOSIS — X58XXXA Exposure to other specified factors, initial encounter: Secondary | ICD-10-CM | POA: Insufficient documentation

## 2023-01-18 DIAGNOSIS — R911 Solitary pulmonary nodule: Secondary | ICD-10-CM | POA: Insufficient documentation

## 2023-01-18 DIAGNOSIS — S0100XD Unspecified open wound of scalp, subsequent encounter: Secondary | ICD-10-CM | POA: Insufficient documentation

## 2023-01-18 DIAGNOSIS — C4490 Unspecified malignant neoplasm of skin, unspecified: Secondary | ICD-10-CM | POA: Diagnosis not present

## 2023-01-18 MED ORDER — SODIUM CHLORIDE (PF) 0.9 % IJ SOLN
INTRAMUSCULAR | Status: AC
  Filled 2023-01-18: qty 50

## 2023-01-18 MED ORDER — IOHEXOL 300 MG/ML  SOLN
100.0000 mL | Freq: Once | INTRAMUSCULAR | Status: AC | PRN
  Administered 2023-01-18: 100 mL via INTRAVENOUS

## 2023-01-18 NOTE — Progress Notes (Signed)
Updated orders needed per imaging center. Per chart review, no allergy to contrast.

## 2023-01-20 DIAGNOSIS — C44229 Squamous cell carcinoma of skin of left ear and external auricular canal: Secondary | ICD-10-CM | POA: Diagnosis not present

## 2023-01-21 DIAGNOSIS — C4492 Squamous cell carcinoma of skin, unspecified: Secondary | ICD-10-CM | POA: Diagnosis not present

## 2023-01-21 DIAGNOSIS — Z79899 Other long term (current) drug therapy: Secondary | ICD-10-CM | POA: Diagnosis not present

## 2023-01-21 DIAGNOSIS — D044 Carcinoma in situ of skin of scalp and neck: Secondary | ICD-10-CM | POA: Diagnosis not present

## 2023-02-02 DIAGNOSIS — C4492 Squamous cell carcinoma of skin, unspecified: Secondary | ICD-10-CM | POA: Diagnosis not present

## 2023-02-02 DIAGNOSIS — Z5112 Encounter for antineoplastic immunotherapy: Secondary | ICD-10-CM | POA: Diagnosis not present

## 2023-02-02 DIAGNOSIS — Z79899 Other long term (current) drug therapy: Secondary | ICD-10-CM | POA: Diagnosis not present

## 2023-02-03 DIAGNOSIS — C44729 Squamous cell carcinoma of skin of left lower limb, including hip: Secondary | ICD-10-CM | POA: Diagnosis not present

## 2023-02-03 DIAGNOSIS — I872 Venous insufficiency (chronic) (peripheral): Secondary | ICD-10-CM | POA: Diagnosis not present

## 2023-02-17 DIAGNOSIS — C44622 Squamous cell carcinoma of skin of right upper limb, including shoulder: Secondary | ICD-10-CM | POA: Diagnosis not present

## 2023-02-23 DIAGNOSIS — Z5112 Encounter for antineoplastic immunotherapy: Secondary | ICD-10-CM | POA: Diagnosis not present

## 2023-02-23 DIAGNOSIS — Z79899 Other long term (current) drug therapy: Secondary | ICD-10-CM | POA: Diagnosis not present

## 2023-02-23 DIAGNOSIS — R21 Rash and other nonspecific skin eruption: Secondary | ICD-10-CM | POA: Diagnosis not present

## 2023-02-23 DIAGNOSIS — C4492 Squamous cell carcinoma of skin, unspecified: Secondary | ICD-10-CM | POA: Diagnosis not present

## 2023-03-03 DIAGNOSIS — I872 Venous insufficiency (chronic) (peripheral): Secondary | ICD-10-CM | POA: Diagnosis not present

## 2023-03-03 DIAGNOSIS — C44719 Basal cell carcinoma of skin of left lower limb, including hip: Secondary | ICD-10-CM | POA: Diagnosis not present

## 2023-03-16 DIAGNOSIS — Z5112 Encounter for antineoplastic immunotherapy: Secondary | ICD-10-CM | POA: Diagnosis not present

## 2023-03-16 DIAGNOSIS — C4492 Squamous cell carcinoma of skin, unspecified: Secondary | ICD-10-CM | POA: Diagnosis not present

## 2023-03-16 DIAGNOSIS — Z79899 Other long term (current) drug therapy: Secondary | ICD-10-CM | POA: Diagnosis not present

## 2023-03-23 DIAGNOSIS — H0015 Chalazion left lower eyelid: Secondary | ICD-10-CM | POA: Diagnosis not present

## 2023-03-23 DIAGNOSIS — H2511 Age-related nuclear cataract, right eye: Secondary | ICD-10-CM | POA: Diagnosis not present

## 2023-03-23 DIAGNOSIS — H524 Presbyopia: Secondary | ICD-10-CM | POA: Diagnosis not present

## 2023-03-23 DIAGNOSIS — H1789 Other corneal scars and opacities: Secondary | ICD-10-CM | POA: Diagnosis not present

## 2023-04-06 DIAGNOSIS — D7589 Other specified diseases of blood and blood-forming organs: Secondary | ICD-10-CM | POA: Diagnosis not present

## 2023-04-06 DIAGNOSIS — C4492 Squamous cell carcinoma of skin, unspecified: Secondary | ICD-10-CM | POA: Diagnosis not present

## 2023-04-06 DIAGNOSIS — Z79899 Other long term (current) drug therapy: Secondary | ICD-10-CM | POA: Diagnosis not present

## 2023-04-06 DIAGNOSIS — Z5112 Encounter for antineoplastic immunotherapy: Secondary | ICD-10-CM | POA: Diagnosis not present

## 2023-04-09 DIAGNOSIS — C4442 Squamous cell carcinoma of skin of scalp and neck: Secondary | ICD-10-CM | POA: Diagnosis not present

## 2023-04-09 DIAGNOSIS — C44622 Squamous cell carcinoma of skin of right upper limb, including shoulder: Secondary | ICD-10-CM | POA: Diagnosis not present

## 2023-04-21 DIAGNOSIS — D485 Neoplasm of uncertain behavior of skin: Secondary | ICD-10-CM | POA: Diagnosis not present

## 2023-04-21 DIAGNOSIS — L570 Actinic keratosis: Secondary | ICD-10-CM | POA: Diagnosis not present

## 2023-04-21 DIAGNOSIS — L905 Scar conditions and fibrosis of skin: Secondary | ICD-10-CM | POA: Diagnosis not present

## 2023-04-26 DIAGNOSIS — D485 Neoplasm of uncertain behavior of skin: Secondary | ICD-10-CM | POA: Diagnosis not present

## 2023-04-27 DIAGNOSIS — D7589 Other specified diseases of blood and blood-forming organs: Secondary | ICD-10-CM | POA: Diagnosis not present

## 2023-04-27 DIAGNOSIS — C4492 Squamous cell carcinoma of skin, unspecified: Secondary | ICD-10-CM | POA: Diagnosis not present

## 2023-04-27 DIAGNOSIS — Z79899 Other long term (current) drug therapy: Secondary | ICD-10-CM | POA: Diagnosis not present

## 2023-04-27 DIAGNOSIS — Z5112 Encounter for antineoplastic immunotherapy: Secondary | ICD-10-CM | POA: Diagnosis not present

## 2023-04-27 DIAGNOSIS — D649 Anemia, unspecified: Secondary | ICD-10-CM | POA: Diagnosis not present

## 2023-06-24 DIAGNOSIS — R109 Unspecified abdominal pain: Secondary | ICD-10-CM | POA: Diagnosis not present

## 2023-06-24 DIAGNOSIS — R195 Other fecal abnormalities: Secondary | ICD-10-CM | POA: Diagnosis not present

## 2023-07-02 DIAGNOSIS — R195 Other fecal abnormalities: Secondary | ICD-10-CM | POA: Diagnosis not present

## 2023-07-07 DIAGNOSIS — D649 Anemia, unspecified: Secondary | ICD-10-CM | POA: Diagnosis not present

## 2023-08-12 DIAGNOSIS — D649 Anemia, unspecified: Secondary | ICD-10-CM | POA: Diagnosis not present

## 2023-08-16 ENCOUNTER — Other Ambulatory Visit: Payer: Self-pay

## 2023-08-16 ENCOUNTER — Emergency Department (HOSPITAL_BASED_OUTPATIENT_CLINIC_OR_DEPARTMENT_OTHER): Payer: Medicare HMO

## 2023-08-16 ENCOUNTER — Inpatient Hospital Stay (HOSPITAL_BASED_OUTPATIENT_CLINIC_OR_DEPARTMENT_OTHER)
Admission: EM | Admit: 2023-08-16 | Discharge: 2023-08-25 | DRG: 683 | Disposition: A | Discharge status: Home / Self Care | Payer: Medicare HMO | Attending: Internal Medicine | Admitting: Internal Medicine

## 2023-08-16 ENCOUNTER — Encounter (HOSPITAL_BASED_OUTPATIENT_CLINIC_OR_DEPARTMENT_OTHER): Payer: Self-pay | Admitting: Emergency Medicine

## 2023-08-16 DIAGNOSIS — D649 Anemia, unspecified: Secondary | ICD-10-CM | POA: Diagnosis not present

## 2023-08-16 DIAGNOSIS — Z1152 Encounter for screening for COVID-19: Secondary | ICD-10-CM | POA: Diagnosis not present

## 2023-08-16 DIAGNOSIS — Z85828 Personal history of other malignant neoplasm of skin: Secondary | ICD-10-CM

## 2023-08-16 DIAGNOSIS — T451X5A Adverse effect of antineoplastic and immunosuppressive drugs, initial encounter: Secondary | ICD-10-CM | POA: Diagnosis present

## 2023-08-16 DIAGNOSIS — E869 Volume depletion, unspecified: Secondary | ICD-10-CM | POA: Diagnosis not present

## 2023-08-16 DIAGNOSIS — Z87891 Personal history of nicotine dependence: Secondary | ICD-10-CM

## 2023-08-16 DIAGNOSIS — K117 Disturbances of salivary secretion: Secondary | ICD-10-CM | POA: Diagnosis present

## 2023-08-16 DIAGNOSIS — K760 Fatty (change of) liver, not elsewhere classified: Secondary | ICD-10-CM | POA: Diagnosis not present

## 2023-08-16 DIAGNOSIS — R531 Weakness: Secondary | ICD-10-CM | POA: Diagnosis not present

## 2023-08-16 DIAGNOSIS — K219 Gastro-esophageal reflux disease without esophagitis: Secondary | ICD-10-CM | POA: Diagnosis present

## 2023-08-16 DIAGNOSIS — D7212 Drug rash with eosinophilia and systemic symptoms syndrome: Secondary | ICD-10-CM | POA: Diagnosis not present

## 2023-08-16 DIAGNOSIS — N179 Acute kidney failure, unspecified: Secondary | ICD-10-CM | POA: Diagnosis not present

## 2023-08-16 DIAGNOSIS — E860 Dehydration: Secondary | ICD-10-CM | POA: Diagnosis not present

## 2023-08-16 DIAGNOSIS — R112 Nausea with vomiting, unspecified: Secondary | ICD-10-CM | POA: Diagnosis not present

## 2023-08-16 DIAGNOSIS — R011 Cardiac murmur, unspecified: Secondary | ICD-10-CM | POA: Diagnosis not present

## 2023-08-16 DIAGNOSIS — C4442 Squamous cell carcinoma of skin of scalp and neck: Secondary | ICD-10-CM | POA: Diagnosis present

## 2023-08-16 DIAGNOSIS — N170 Acute kidney failure with tubular necrosis: Principal | ICD-10-CM | POA: Diagnosis present

## 2023-08-16 DIAGNOSIS — R627 Adult failure to thrive: Secondary | ICD-10-CM | POA: Diagnosis present

## 2023-08-16 DIAGNOSIS — R9431 Abnormal electrocardiogram [ECG] [EKG]: Secondary | ICD-10-CM | POA: Diagnosis not present

## 2023-08-16 DIAGNOSIS — R918 Other nonspecific abnormal finding of lung field: Secondary | ICD-10-CM | POA: Diagnosis not present

## 2023-08-16 DIAGNOSIS — E871 Hypo-osmolality and hyponatremia: Secondary | ICD-10-CM | POA: Diagnosis present

## 2023-08-16 DIAGNOSIS — M3500 Sicca syndrome, unspecified: Secondary | ICD-10-CM | POA: Diagnosis present

## 2023-08-16 DIAGNOSIS — B029 Zoster without complications: Secondary | ICD-10-CM | POA: Diagnosis present

## 2023-08-16 DIAGNOSIS — E041 Nontoxic single thyroid nodule: Secondary | ICD-10-CM | POA: Diagnosis not present

## 2023-08-16 DIAGNOSIS — Z923 Personal history of irradiation: Secondary | ICD-10-CM | POA: Diagnosis not present

## 2023-08-16 DIAGNOSIS — I77810 Thoracic aortic ectasia: Secondary | ICD-10-CM | POA: Diagnosis not present

## 2023-08-16 DIAGNOSIS — R7989 Other specified abnormal findings of blood chemistry: Secondary | ICD-10-CM | POA: Diagnosis present

## 2023-08-16 DIAGNOSIS — I38 Endocarditis, valve unspecified: Secondary | ICD-10-CM | POA: Diagnosis not present

## 2023-08-16 LAB — COMPREHENSIVE METABOLIC PANEL
ALT: 65 U/L — ABNORMAL HIGH (ref 0–44)
AST: 92 U/L — ABNORMAL HIGH (ref 15–41)
Albumin: 3.5 g/dL (ref 3.5–5.0)
Alkaline Phosphatase: 137 U/L — ABNORMAL HIGH (ref 38–126)
Anion gap: 9 (ref 5–15)
BUN: 27 mg/dL — ABNORMAL HIGH (ref 8–23)
CO2: 21 mmol/L — ABNORMAL LOW (ref 22–32)
Calcium: 8.9 mg/dL (ref 8.9–10.3)
Chloride: 100 mmol/L (ref 98–111)
Creatinine, Ser: 1.64 mg/dL — ABNORMAL HIGH (ref 0.61–1.24)
GFR, Estimated: 41 mL/min — ABNORMAL LOW (ref >60)
Glucose, Bld: 95 mg/dL (ref 70–99)
Potassium: 4.9 mmol/L (ref 3.5–5.1)
Sodium: 130 mmol/L — ABNORMAL LOW (ref 135–145)
Total Bilirubin: 1.3 mg/dL — ABNORMAL HIGH (ref 0.3–1.2)
Total Protein: 6.6 g/dL (ref 6.5–8.1)

## 2023-08-16 LAB — CBC
HCT: 33.1 % — ABNORMAL LOW (ref 39.0–52.0)
Hemoglobin: 11.2 g/dL — ABNORMAL LOW (ref 13.0–17.0)
MCH: 32.8 pg (ref 26.0–34.0)
MCHC: 33.8 g/dL (ref 30.0–36.0)
MCV: 97.1 fL (ref 80.0–100.0)
Platelets: 283 10*3/uL (ref 150–400)
RBC: 3.41 MIL/uL — ABNORMAL LOW (ref 4.22–5.81)
RDW: 13.8 % (ref 11.5–15.5)
WBC: 8.7 10*3/uL (ref 4.0–10.5)
nRBC: 0 % (ref 0.0–0.2)

## 2023-08-16 LAB — BRAIN NATRIURETIC PEPTIDE: B Natriuretic Peptide: 84.5 pg/mL (ref 0.0–100.0)

## 2023-08-16 LAB — SARS CORONAVIRUS 2 BY RT PCR: SARS Coronavirus 2 by RT PCR: NEGATIVE

## 2023-08-16 LAB — TROPONIN I (HIGH SENSITIVITY): Troponin I (High Sensitivity): 6 ng/L (ref <18)

## 2023-08-16 LAB — LIPASE, BLOOD: Lipase: 85 U/L — ABNORMAL HIGH (ref 11–51)

## 2023-08-16 MED ORDER — IOHEXOL 300 MG/ML  SOLN
100.0000 mL | Freq: Once | INTRAMUSCULAR | Status: AC | PRN
  Administered 2023-08-16: 75 mL via INTRAVENOUS

## 2023-08-16 MED ORDER — LACTATED RINGERS IV BOLUS
1000.0000 mL | Freq: Once | INTRAVENOUS | Status: AC
  Administered 2023-08-16: 1000 mL via INTRAVENOUS

## 2023-08-16 NOTE — ED Provider Notes (Signed)
[place] [facility] AT [facility] Provider Note   CSN: [id number] Arrival date & time: 08/16/23  1135     History  Chief Complaint  Patient presents with   Dry Mouth   Emesis   Nausea    Sean Skinner is a 82 y.o. 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  His wife and daughter are bedside.  Patient says that for the past 2 weeks he has not had any saliva production and his mouth has been extremely dry. He has been drinking plenty of fluids, but has been unable to tolerate solid food. When he tries to eat, he feels nauseous and throws up the food immediately.  No blood in the emesis. He has also felt cold and lethargic. Last week, he had a right molar tooth extracted because a crown fell off and the tooth was noted to be rotting underneath it.  He denies any pain or discharge in the area of the extraction. He also notes new swelling in his ankles. No changes in bowel movements or urination. No fevers, chills, night sweats at home.      Home Medications Prior to Admission medications   Medication Sig Start Date End Date Taking? Authorizing Provider  valACYclovir (VALTREX) 1000 MG tablet  09/25/19   [provider]      Allergies    Patient has no known allergies.    Review of Systems   Review of Systems  Constitutional:  Positive for activity change and fatigue. Negative for fever and unexpected weight change.  HENT:  Positive for dental problem.        Recent extraction of molar tooth on upper right  Eyes: Negative.   Respiratory: Negative.    Cardiovascular:  Positive for leg swelling. Negative for chest pain and palpitations.  Gastrointestinal:  Positive for vomiting. Negative for abdominal distention, abdominal pain, constipation and diarrhea.  Endocrine: Positive for cold intolerance.   Genitourinary:  Negative for difficulty urinating, dysuria, flank pain, frequency, hematuria and urgency.  Musculoskeletal: Negative.   Skin:  Positive for wound.  Allergic/Immunologic: Negative.   Neurological: Negative.   Hematological: Negative.  Negative for adenopathy.  Psychiatric/Behavioral: Negative.      Physical Exam Updated Vital Signs BP 117/74   Pulse 91   Temp 98.4 F (36.9 C) (Oral)   Resp (!) 22   Ht 6\' 2"  (1.88 m)   Wt 85.7 kg   SpO2 97%   BMI 24.27 kg/m  Physical Exam HENT:     Head: Normocephalic and atraumatic.     Nose: Nose normal.     Mouth/Throat:     Mouth: Mucous membranes are dry.  Eyes:     Extraocular Movements: Extraocular movements intact.     Pupils: Pupils are equal, round, and reactive to light.  Cardiovascular:     Rate and Rhythm: Normal rate and regular rhythm.     Pulses: Normal pulses.     Heart sounds: Murmur heard.     Comments: New 2+ bilateral lower extremity edema Pulmonary:     Effort: Pulmonary effort is normal.     Breath sounds: Normal breath sounds.  Abdominal:     General: Bowel sounds are normal. There is no distension.     Palpations: Abdomen is soft.     Tenderness: There is no  abdominal tenderness.  Musculoskeletal:     Cervical back: Normal range of motion and neck supple.  Skin:    Findings: Lesion present.     Comments: Scalp lesion from prior SCC resection, incompletely healed. Being followed by Mohs regularly. Skin diffusely dry.    Neurological:     General: No focal deficit present.     Mental Status: He is alert and oriented to person, place, and time.    ED Results / Procedures / Treatments   Labs (all labs ordered are listed, but only abnormal results are displayed) Labs Reviewed  LIPASE, BLOOD - Abnormal; Notable for the following components:      Result Value   Lipase 85 (*)    All other components within normal limits  COMPREHENSIVE METABOLIC PANEL - Abnormal; Notable for the following  components:   Sodium 130 (*)    CO2 21 (*)    BUN 27 (*)    Creatinine, Ser 1.64 (*)    AST 92 (*)    ALT 65 (*)    Alkaline Phosphatase 137 (*)    Total Bilirubin 1.3 (*)    GFR, Estimated 41 (*)    All other components within normal limits  CBC - Abnormal; Notable for the following components:   RBC 3.41 (*)    Hemoglobin 11.2 (*)    HCT 33.1 (*)    All other components within normal limits  SARS CORONAVIRUS 2 BY RT PCR  BRAIN NATRIURETIC PEPTIDE  TROPONIN I (HIGH SENSITIVITY)  TROPONIN I (HIGH SENSITIVITY)    EKG EKG Interpretation Date/Time:  Monday August 16 2023 15:13:22 EDT Ventricular Rate:  95 PR Interval:  207 QRS Duration:  126 QT Interval:  386 QTC Calculation: 486 R Axis:   -71  Text Interpretation: Sinus rhythm Borderline prolonged PR interval Nonspecific IVCD with LAD Borderline T abnormalities, anterior leads Confirmed by Ernie Avena ([id number]) on 08/16/2023 4:05:17 PM  Radiology CT CHEST ABDOMEN PELVIS WO CONTRAST  Result Date: 08/16/2023 CLINICAL DATA:  Dry mouth, nausea, emesis. Chills. Lethargy. Squamous cell cancer on top of head. Lower extremity swelling. EXAM: CT CHEST, ABDOMEN AND PELVIS WITHOUT CONTRAST TECHNIQUE: Multidetector CT imaging of the chest, abdomen and pelvis was performed following the standard protocol without IV contrast. RADIATION DOSE REDUCTION: This exam was performed according to the departmental dose-optimization program which includes automated exposure control, adjustment of the mA and/or kV according to patient size and/or use of iterative reconstruction technique. COMPARISON:  Same day chest radiograph FINDINGS: CT CHEST FINDINGS Cardiovascular: Coronary artery and aortic atherosclerotic calcification. No pericardial effusion. Dilated ascending aorta measuring 41 mm in diameter. Mediastinum/Nodes: Trachea and esophagus are unremarkable. No thoracic adenopathy noting limitations of noncontrast exam. Lungs/Pleura: Bibasilar  atelectasis/scarring. No pleural effusion or pneumothorax. Multiple bilateral pulmonary nodules. For example a left lower lobe subpleural nodule measures 8 x 4 mm on series 4/image 88. A left upper lobe perifissural nodule measures 5 x 7 mm on 4/54. 6 x 12 mm nodular thickening along or within the right major fissure (4/73). A right upper lobe perifissural nodule measures 5 x 6 mm on 4/41. Musculoskeletal: No acute fracture or destructive osseous lesion. CT ABDOMEN PELVIS FINDINGS Hepatobiliary: Unremarkable noncontrast appearance of the liver, gallbladder, biliary tree. Pancreas: Unremarkable. Spleen: Unremarkable. Adrenals/Urinary Tract: Contrast from previous CT neck within the ureters and bladder. No hydronephrosis. Unremarkable adrenal glands. Stomach/Bowel: Normal caliber large and small bowel. No bowel wall thickening. Stomach and appendix are within normal limits. Vascular/Lymphatic: No significant vascular findings  are present. No enlarged abdominal or pelvic lymph nodes. Reproductive: No acute abnormality. Other: No free intraperitoneal fluid or air. Musculoskeletal: No acute fracture or destructive osseous lesion. IMPRESSION: 1. No acute abnormality in the chest, abdomen, or pelvis. 2. Multiple bilateral pulmonary nodules measuring up to 9 mm (average dimension). Non-contrast chest CT at 3-6 months is recommended. If the nodules are stable at time of repeat CT, then future CT at 18-24 months (from today's scan) is considered optional for low-risk patients, but is recommended for high-risk patients. This recommendation follows the consensus statement: Guidelines for Management of Incidental Pulmonary Nodules Detected on CT Images: From the Fleischner Society 2017; Radiology 2017; 284:228-243. Aortic Atherosclerosis (ICD10-I70.0). Electronically Signed   By: Minerva Fester M.D.   On: 08/16/2023 20:43   CT Soft Tissue Neck W Contrast  Result Date: 08/16/2023 CLINICAL DATA:  Occult malignancy Left sided  facial swelling, ? salivary tumor? No saliva production EXAM: CT NECK WITH CONTRAST TECHNIQUE: Multidetector CT imaging of the neck was performed using the standard protocol following the bolus administration of intravenous contrast. RADIATION DOSE REDUCTION: This exam was performed according to the departmental dose-optimization program which includes automated exposure control, adjustment of the mA and/or kV according to patient size and/or use of iterative reconstruction technique. CONTRAST:  75mL OMNIPAQUE IOHEXOL 300 MG/ML  SOLN COMPARISON:  CT of the neck 01/18/2023. FINDINGS: Pharynx and larynx: Normal. No mass or swelling. Streak artifact from dental amalgam limits assessment. Salivary glands: No visible mass lesion or inflammatory change. No stone. Thyroid: Subcentimeter right thyroid nodule which does not require further imaging follow-up (ref: J Am Coll Radiol. 2015 Feb;12(2): 143-50). Lymph nodes: None enlarged or abnormal density. Vascular: Negative. Limited intracranial: Negative. Visualized orbits: Negative. Mastoids and visualized paranasal sinuses: Mild inferior maxillary sinus mucosal thickening. Otherwise, clear. Skeleton: No evidence of acute abnormality on limited assessment. Upper chest: Visualized lung apices are clear. IMPRESSION: No visible mass lesion or appreciable inflammatory change. If there is high clinical concern for malignancy, MRI face or neck with contrast could provide further evaluation. Electronically Signed   By: Feliberto Harts M.D.   On: 08/16/2023 18:41   DG Chest Portable 1 View  Result Date: 08/16/2023 CLINICAL DATA:  Weakness EXAM: PORTABLE CHEST 1 VIEW COMPARISON:  None Available. FINDINGS: The heart size and mediastinal contours are within normal limits. Both lungs are clear. The visualized skeletal structures are unremarkable. IMPRESSION: No active disease. Electronically Signed   By: Layla Maw M.D.   On: 08/16/2023 17:38   US Abdomen Limited RUQ  (LIVER/GB)  Result Date: 08/16/2023 CLINICAL DATA:  Elevated LFTs EXAM: ULTRASOUND ABDOMEN LIMITED RIGHT UPPER QUADRANT COMPARISON:  None Available. FINDINGS: Gallbladder: No gallstones or wall thickening visualized. No sonographic Murphy sign noted by sonographer. Common bile duct: Diameter: 6 mm Liver: Mildly increased echogenicity. No focal lesion. Portal vein is patent on color Doppler imaging with normal direction of blood flow towards the liver. Other: None. IMPRESSION: 1. No cholelithiasis or sonographic evidence for acute cholecystitis. 2. Mildly increased hepatic parenchymal echogenicity suggestive of steatosis. Electronically Signed   By: Annia Belt M.D.   On: 08/16/2023 16:38    Procedures Procedures    Medications Ordered in ED Medications  lactated ringers bolus 1,000 mL (0 mLs Intravenous Stopping previously hung infusion 08/16/23 1930)  iohexol (OMNIPAQUE) 300 MG/ML solution 100 mL (75 mLs Intravenous Contrast Given 08/16/23 1705)    ED Course/ Medical Decision Making/ A&P Clinical Course as of 08/16/23 2200  Mon Aug 16, 2023  1504 Creatinine(!): 1.64 [MJ]    Clinical Course User Index [MJ] Annett Fabian, MD                                 Medical Decision Making Amount and/or Complexity of Data Reviewed Labs: ordered. Decision-making details documented in ED Course. Radiology: ordered.  Risk Prescription drug management. Decision regarding hospitalization.   This patient presents to the ED for concern of two weeks of emesis and lethargy, this involves an extensive number of treatment options, and is a complaint that carries with it a high risk of complications and morbidity.    Co morbidities: SCC scalp s/p head and neck radiation and immunotherapy  Social Determinants of Health:  None  Additional history:  External records from outside source obtained and reviewed including prior labs from 04/27/23.  At that time the AST/ALT were within normal limits.   Creatinine was 1.1, which appears to be his baseline.   Lab Tests:  I Ordered, and personally interpreted labs.  The pertinent results include:   Hyponatremic, sodium 130 BUN 27, creatinine elevated at 1.64 consisted with AKI Alk phos 137 Lipase 85 AST/ALT 92/65 Total bili elevated at 1.3 Troponin 6 COVID negative  No leukocytosis Mild normocytic anemia, hemoglobin 11.2  Imaging Studies:  I ordered imaging studies including RUQ ultrasound, CXR, CT soft tissue neck, CT chest, abdomen and pelvis I independently visualized and interpreted imaging including:  RUQ ultrasound which showed a normal appearing gallbladder with no stones or cholecystitis. CXR was grossly unremarkable, no infiltrates or effusions.   CT soft tissue neck: no visible mass lesions  CT chest, abdomen and pelvis: no acute abnormalities in the chest, abdomen, pelvis.   I agree with the radiologist interpretation  Cardiac Monitoring/ECG:  The patient was maintained on a cardiac monitor.  I personally viewed and interpreted the cardiac monitor and the EKG which showed an underlying rhythm of: sinus rhythm, no ST changes  Medicines ordered and prescription drug management:  I ordered medication including  Medications  lactated ringers bolus 1,000 mL (0 mLs Intravenous Stopping previously hung infusion 08/16/23 1930)  iohexol (OMNIPAQUE) 300 MG/ML solution 100 mL (75 mLs Intravenous Contrast Given 08/16/23 1705)   for dehydration.  Reevaluation of the patient after these medicines showed that the patient improved I have reviewed the patients home medicines and have made adjustments as needed  Test Considered:  Echocardiogram, MRI face or neck with contrast  Critical Interventions:  1L LR fluids   Problem List / ED Course:    ICD-10-CM   1. AKI (acute kidney injury) (HCC)  N17.9       MDM:   Patient has an AKI with Cr elevated at 1.6, up from baseline 1.1. He is dehydrated secondary to  recurrent emesis. 1L LR given. Unclear etiology of emesis. No abdominal tenderness or distension, so unlikely obstruction. RUQ ultrasound ordered, showed some hepatic steatosis but no cholecystitis. Patient also has new heart murmur, new bilateral LE edema. Considered new onset HF, but no dyspnea or crackles on exam. Also considered endocarditis in the setting of a recent molar tooth extraction, but he remains afebrile, no leukocytosis. Ordered CT soft tissue neck and CT chest, abdomen, and pelvis which showed no acute abnormalities.   Dispostion:  After consideration of the diagnostic results and the patients response to treatment, I feel that the patent would benefit from admission to the hospital for rehydration in  the setting of an AKI. Updated the family about this plan and spoke with Dr. Janalyn Shy for admission.   Final Clinical Impression(s) / ED Diagnoses Final diagnoses:  AKI (acute kidney injury) [facility])    Rx / DC Orders ED Discharge Orders     None         Annett Fabian, MD 08/16/23 2202    Ernie Avena, MD 08/16/23 2227    Ernie Avena, MD 08/16/23 2227

## 2023-08-16 NOTE — ED Notes (Signed)
Patient transported to CT 

## 2023-08-16 NOTE — ED Triage Notes (Addendum)
Pt arrives to ED with c/o dry mouth, nausea, and emesis x2 weeks. Pt notes that his mouth continues to get more dry each day. He notes he can tolerate fluids but over the past week when he eats solid foods he starts to gag and becomes nauseous and vomits. He also notes that he cannot stay warm and is always cold. He denies pain but notes lethargy. Gfamilyg notes SCC to top of head and new onset lower extremity swelling. Family also notes new dental issue as well.

## 2023-08-16 NOTE — ED Notes (Signed)
ED Provider at bedside. 

## 2023-08-16 NOTE — Progress Notes (Addendum)
Plan of Care Note for accepted transfer  Patient: Sean Skinner              NWG:[id number]  DOA: 08/16/2023     Facility requesting transfer: [facility] emergency department Requesting Provider: Annett Fabian, MD  Reason for transfer: Generalized weakness workup revealed AKI.  Patient needs IV hydration.   Facility course: Sean Skinner is a 82 y.o. 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 His wife and daughter are bedside.  Patient says that for the past 2 weeks he has not had any saliva production and his mouth has been extremely dry. He has been drinking plenty of fluids, but has been unable to tolerate solid food. When he tries to eat, he feels nauseous and throws up the food immediately.  No blood in the emesis. He has also felt cold and lethargic. Last week, he had a right molar tooth extracted because a crown fell off and the tooth was noted to be rotting underneath it.  He denies any pain or discharge in the area of the extraction. He also notes new swelling in his ankles. No changes in bowel movements or urination. No fevers, chills, night sweats at home.    At presentation to ED patient is hemodynamically stable. CMP showed low sodium 130, potassium 4.9, chloride 100, low bicarb 21, BUN elevated 27, elevated creatinine 1.64, calcium 8.9, elevated AST/ALT/ALP and bilirubin level.  GFR 41. CBC unremarkable except low hemoglobin 11.2 and hematocrit 33.2.  We do not have any CBC and CMP on our chart for comparison.  Extensive imaging in the ED was done. Ultrasound abdomen showed hepatic steatosis. Chest x-ray unremarkable. CT soft tissue neck with contrast No visible mass lesion or appreciable inflammatory change. If there is high clinical concern for malignancy, MRI face or neck with contrast could provide further  evaluation.   CT chest and abdomen without contrast- IMPRESSION: 1. No acute abnormality in the chest, abdomen, or pelvis. 2. Multiple bilateral pulmonary nodules measuring up to 9 mm (average dimension). Non-contrast chest CT at 3-6 months is recommended. If the nodules are stable at time of repeat CT, then future CT at 18-24 months (from today's scan) is considered optional for low-risk patients, but is recommended for high-risk patients. This recommendation follows the consensus statement: Guidelines for Management of Incidental Pulmonary Nodules Detected on CT Images.  In the ED patient has been treated with 1 L of IV fluid.  Hospitalist has been consulted for further evaluation of AKI and xerostomia.  Plan of care: Need rehydration for the management of AKI.  New finding of multiple lung nodule.  For the lung nodule need CT chest outpatient follow-up in 3 to 6 months and outpatient pulmonary referral.  The patient is accepted for observation to Med-surg  unit, at [facility]. Addendum: Patient's family want to send him to [facility] not [facility].  Change bed request to [facility].   Check [email or web address] for on-call coverage.  [facility] will assume care on arrival to accepting facility. Until arrival, medical decision making responsibilities remain with the EDP.  However, [facility] available 24/7 for questions and assistance.   Nursing staff please page [facility] Admits and Consults [phone]) as soon as the patient arrives to the hospital.    Author: Tereasa Coop, MD  08/16/2023  [facility]

## 2023-08-17 ENCOUNTER — Observation Stay (HOSPITAL_BASED_OUTPATIENT_CLINIC_OR_DEPARTMENT_OTHER): Payer: Medicare HMO

## 2023-08-17 DIAGNOSIS — R011 Cardiac murmur, unspecified: Secondary | ICD-10-CM | POA: Diagnosis not present

## 2023-08-17 DIAGNOSIS — N170 Acute kidney failure with tubular necrosis: Secondary | ICD-10-CM | POA: Diagnosis not present

## 2023-08-17 DIAGNOSIS — K117 Disturbances of salivary secretion: Secondary | ICD-10-CM | POA: Diagnosis not present

## 2023-08-17 DIAGNOSIS — E860 Dehydration: Secondary | ICD-10-CM | POA: Diagnosis not present

## 2023-08-17 DIAGNOSIS — R918 Other nonspecific abnormal finding of lung field: Secondary | ICD-10-CM | POA: Diagnosis not present

## 2023-08-17 DIAGNOSIS — N179 Acute kidney failure, unspecified: Secondary | ICD-10-CM | POA: Diagnosis not present

## 2023-08-17 DIAGNOSIS — I38 Endocarditis, valve unspecified: Secondary | ICD-10-CM

## 2023-08-17 DIAGNOSIS — M3500 Sicca syndrome, unspecified: Secondary | ICD-10-CM | POA: Diagnosis not present

## 2023-08-17 DIAGNOSIS — C4442 Squamous cell carcinoma of skin of scalp and neck: Secondary | ICD-10-CM | POA: Diagnosis not present

## 2023-08-17 DIAGNOSIS — B029 Zoster without complications: Secondary | ICD-10-CM | POA: Diagnosis not present

## 2023-08-17 DIAGNOSIS — R7989 Other specified abnormal findings of blood chemistry: Secondary | ICD-10-CM | POA: Diagnosis not present

## 2023-08-17 DIAGNOSIS — R112 Nausea with vomiting, unspecified: Secondary | ICD-10-CM

## 2023-08-17 DIAGNOSIS — K760 Fatty (change of) liver, not elsewhere classified: Secondary | ICD-10-CM | POA: Diagnosis not present

## 2023-08-17 DIAGNOSIS — K219 Gastro-esophageal reflux disease without esophagitis: Secondary | ICD-10-CM | POA: Diagnosis not present

## 2023-08-17 DIAGNOSIS — Z1152 Encounter for screening for COVID-19: Secondary | ICD-10-CM | POA: Diagnosis not present

## 2023-08-17 DIAGNOSIS — Z85828 Personal history of other malignant neoplasm of skin: Secondary | ICD-10-CM | POA: Diagnosis not present

## 2023-08-17 DIAGNOSIS — D7212 Drug rash with eosinophilia and systemic symptoms syndrome: Secondary | ICD-10-CM | POA: Diagnosis not present

## 2023-08-17 DIAGNOSIS — Z923 Personal history of irradiation: Secondary | ICD-10-CM | POA: Diagnosis not present

## 2023-08-17 DIAGNOSIS — E871 Hypo-osmolality and hyponatremia: Secondary | ICD-10-CM | POA: Diagnosis not present

## 2023-08-17 DIAGNOSIS — R627 Adult failure to thrive: Secondary | ICD-10-CM | POA: Diagnosis not present

## 2023-08-17 DIAGNOSIS — D649 Anemia, unspecified: Secondary | ICD-10-CM | POA: Diagnosis not present

## 2023-08-17 DIAGNOSIS — Z87891 Personal history of nicotine dependence: Secondary | ICD-10-CM | POA: Diagnosis not present

## 2023-08-17 DIAGNOSIS — T451X5A Adverse effect of antineoplastic and immunosuppressive drugs, initial encounter: Secondary | ICD-10-CM | POA: Diagnosis not present

## 2023-08-17 LAB — CBC
HCT: 31.3 % — ABNORMAL LOW (ref 39.0–52.0)
Hemoglobin: 10.5 g/dL — ABNORMAL LOW (ref 13.0–17.0)
MCH: 33.1 pg (ref 26.0–34.0)
MCHC: 33.5 g/dL (ref 30.0–36.0)
MCV: 98.7 fL (ref 80.0–100.0)
Platelets: 257 10*3/uL (ref 150–400)
RBC: 3.17 MIL/uL — ABNORMAL LOW (ref 4.22–5.81)
RDW: 13.6 % (ref 11.5–15.5)
WBC: 7.6 10*3/uL (ref 4.0–10.5)
nRBC: 0 % (ref 0.0–0.2)

## 2023-08-17 LAB — COMPREHENSIVE METABOLIC PANEL
ALT: 62 U/L — ABNORMAL HIGH (ref 0–44)
AST: 89 U/L — ABNORMAL HIGH (ref 15–41)
Albumin: 2.9 g/dL — ABNORMAL LOW (ref 3.5–5.0)
Alkaline Phosphatase: 134 U/L — ABNORMAL HIGH (ref 38–126)
Anion gap: 9 (ref 5–15)
BUN: 24 mg/dL — ABNORMAL HIGH (ref 8–23)
CO2: 19 mmol/L — ABNORMAL LOW (ref 22–32)
Calcium: 8.2 mg/dL — ABNORMAL LOW (ref 8.9–10.3)
Chloride: 102 mmol/L (ref 98–111)
Creatinine, Ser: 1.68 mg/dL — ABNORMAL HIGH (ref 0.61–1.24)
GFR, Estimated: 40 mL/min — ABNORMAL LOW (ref >60)
Glucose, Bld: 93 mg/dL (ref 70–99)
Potassium: 4.5 mmol/L (ref 3.5–5.1)
Sodium: 130 mmol/L — ABNORMAL LOW (ref 135–145)
Total Bilirubin: 1.4 mg/dL — ABNORMAL HIGH (ref 0.3–1.2)
Total Protein: 5.8 g/dL — ABNORMAL LOW (ref 6.5–8.1)

## 2023-08-17 LAB — ECHOCARDIOGRAM COMPLETE
Area-P 1/2: 2.34 cm2
Calc EF: 66.4 %
Height: 74 in
S' Lateral: 3.3 cm
Single Plane A2C EF: 65.8 %
Single Plane A4C EF: 67.6 %
Weight: 3024 oz

## 2023-08-17 LAB — SEDIMENTATION RATE: Sed Rate: 30 mm/hr — ABNORMAL HIGH (ref 0–16)

## 2023-08-17 LAB — C-REACTIVE PROTEIN: CRP: 1.9 mg/dL — ABNORMAL HIGH (ref <1.0)

## 2023-08-17 MED ORDER — LACTATED RINGERS IV SOLN: INTRAVENOUS | Status: DC

## 2023-08-17 MED ORDER — ONDANSETRON HCL 4 MG PO TABS: 4.0000 mg | ORAL_TABLET | Freq: Four times a day (QID) | ORAL | Status: DC | PRN

## 2023-08-17 MED ORDER — ACETAMINOPHEN 325 MG PO TABS: 650.0000 mg | ORAL_TABLET | Freq: Four times a day (QID) | ORAL | Status: DC | PRN

## 2023-08-17 MED ORDER — ACETAMINOPHEN 650 MG RE SUPP: 650.0000 mg | Freq: Four times a day (QID) | RECTAL | Status: DC | PRN

## 2023-08-17 MED ORDER — ONDANSETRON HCL 4 MG/2ML IJ SOLN
4.0000 mg | Freq: Four times a day (QID) | INTRAMUSCULAR | Status: DC | PRN
  Administered 2023-08-19 – 2023-08-22 (×2): 4 mg via INTRAVENOUS
  Filled 2023-08-17 (×2): qty 2

## 2023-08-17 MED ORDER — ENOXAPARIN SODIUM 40 MG/0.4ML IJ SOSY
40.0000 mg | PREFILLED_SYRINGE | INTRAMUSCULAR | Status: DC
  Administered 2023-08-17 – 2023-08-25 (×9): 40 mg via SUBCUTANEOUS
  Filled 2023-08-17 (×9): qty 0.4

## 2023-08-17 NOTE — H&P (Addendum)
History and Physical    Patient: Sean Skinner FAO:[id number] DOB: 1941-07-06 DOA: 08/16/2023 DOS: the patient was seen and examined on 08/17/2023 PCP: Emilio Aspen, MD  Patient coming from: Home  Chief Complaint:  Chief Complaint  Patient presents with   Dry Mouth   Emesis   Nausea   HPI: Sean Skinner is a 82 y.o. 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  Pt presents to ED with 2 week h/o dry mouth, emesis, fatigue, BLE edema.  Patient says that for the past 2 weeks he has not had any saliva production and his mouth has been extremely dry. He has been drinking plenty of fluids, but has been unable to tolerate solid food. When he tries to eat, he feels nauseous and throws up the food immediately. No blood in the emesis. He has also felt cold and lethargic. Last week, he had a right molar tooth extracted because a crown fell off and the tooth was noted to be rotting underneath it. He denies any pain or discharge in the area of the extraction. He also notes new swelling in his ankles. No changes in bowel movements or urination. No fevers, chills, night sweats at home.   ED work up: CMP showed low sodium 130, potassium 4.9, chloride 100, low bicarb 21, BUN elevated 27, elevated creatinine 1.64, calcium 8.9, elevated AST/ALT/ALP and bilirubin level.  GFR 41. CBC unremarkable except low hemoglobin 11.2 and hematocrit 33.2.  Previous creatinine at [facility] 04/27/23 was 1.13  Ultrasound abdomen showed hepatic steatosis. Chest x-ray unremarkable. CT soft tissue neck with contrast No visible mass lesion or appreciable inflammatory change. If there is high clinical concern for malignancy, MRI face or neck with contrast could provide further evaluation.  CT chest and abdomen without contrast- IMPRESSION: 1. No acute abnormality in  the chest, abdomen, or pelvis. 2. Multiple bilateral pulmonary nodules measuring up to 9 mm (average dimension). Non-contrast chest CT at 3-6 months is recommended. If the nodules are stable at time of repeat CT, then future CT at 18-24 months (from today's scan) is considered optional for low-risk patients, but is recommended for high-risk patients. This recommendation follows the consensus statement: Guidelines for Management of Incidental Pulmonary Nodules Detected on CT Images.  (See A/P regarding discussion of the pulm nodules).  Pt transferred to [facility] for admission.   Review of Systems: As mentioned in the history of present illness. All other systems reviewed and are negative. Past Medical History:  Diagnosis Date   Basal cell cancer    face,arms,neck skin   Cystoid macular degeneration of left eye    HZV (herpes zoster virus) post herpetic neuralgia    Past Surgical History:  Procedure Laterality Date   COLONOSCOPY WITH PROPOFOL N/A 03/13/2013   Procedure: COLONOSCOPY WITH PROPOFOL;  Surgeon: Charolett Bumpers, MD;  Location: [facility];  Service: Endoscopy;  Laterality: N/A;   IRRIGATION AND DEBRIDEMENT OF WOUND WITH SPLIT THICKNESS SKIN GRAFT Right 12/04/2022   Procedure: Operative debridement and preparation of 5X5 cm scalp wound, Integra placement;  Surgeon: Santiago Glad, MD;  Location: [facility];  Service: Plastics;  Laterality: Right;   Removal of skin cancer     Social History:  reports that he has never smoked. He has quit using smokeless tobacco. He reports current alcohol use. He reports that  he does not use drugs.  No Known Allergies  History reviewed. No pertinent family history.  Prior to Admission medications   Medication Sig Start Date End Date Taking? Authorizing Provider  valACYclovir (VALTREX) 1000 MG tablet  09/25/19   [provider]    Physical Exam: Vitals:   08/16/23 1645 08/16/23 1700 08/17/23 0003 08/17/23 0114   BP: 113/72 117/74  120/74  Pulse: 89 91  100  Resp: (!) 24 (!) 22  19  Temp:   98.5 F (36.9 C) 98.6 F (37 C)  TempSrc:   Oral Oral  SpO2: 95% 97%  96%  Weight:      Height:       Constitutional: NAD, calm, comfortable Respiratory: clear to auscultation bilaterally, no wheezing, no crackles. Normal respiratory effort. No accessory muscle use.  Cardiovascular: Murmur present Abdomen: no tenderness, no masses palpated. No hepatosplenomegaly. Bowel sounds positive.  Skin: Band-Aid on scalp Neurologic: CN 2-12 grossly intact. Sensation intact, DTR normal. Strength 5/5 in all 4.  Psychiatric: Normal judgment and insight. Alert and oriented x 3. Normal mood.   Data Reviewed:    Labs on Admission: I have personally reviewed following labs and imaging studies  CBC: Recent Labs  Lab 08/16/23 1223  WBC 8.7  HGB 11.2*  HCT 33.1*  MCV 97.1  PLT 283   Basic Metabolic Panel: Recent Labs  Lab 08/16/23 1223  NA 130*  K 4.9  CL 100  CO2 21*  GLUCOSE 95  BUN 27*  CREATININE 1.64*  CALCIUM 8.9   GFR: Estimated Creatinine Clearance: 39.7 mL/min (A) (by C-G formula based on SCr of 1.64 mg/dL (H)). Liver Function Tests: Recent Labs  Lab 08/16/23 1223  AST 92*  ALT 65*  ALKPHOS 137*  BILITOT 1.3*  PROT 6.6  ALBUMIN 3.5   Recent Labs  Lab 08/16/23 1223  LIPASE 85*   No results for input(s): "AMMONIA" in the last 168 hours. Coagulation Profile: No results for input(s): "INR", "PROTIME" in the last 168 hours. Cardiac Enzymes: No results for input(s): "CKTOTAL", "CKMB", "CKMBINDEX", "TROPONINI" in the last 168 hours. BNP (last 3 results) No results for input(s): "PROBNP" in the last 8760 hours. HbA1C: No results for input(s): "HGBA1C" in the last 72 hours. CBG: No results for input(s): "GLUCAP" in the last 168 hours. Lipid Profile: No results for input(s): "CHOL", "HDL", "LDLCALC", "TRIG", "CHOLHDL", "LDLDIRECT" in the last 72 hours. Thyroid Function Tests: No  results for input(s): "TSH", "T4TOTAL", "FREET4", "T3FREE", "THYROIDAB" in the last 72 hours. Anemia Panel: No results for input(s): "VITAMINB12", "FOLATE", "FERRITIN", "TIBC", "IRON", "RETICCTPCT" in the last 72 hours. Urine analysis: No results found for: "COLORURINE", "APPEARANCEUR", "LABSPEC", "PHURINE", "GLUCOSEU", "HGBUR", "BILIRUBINUR", "KETONESUR", "PROTEINUR", "UROBILINOGEN", "NITRITE", "LEUKOCYTESUR"  Radiological Exams on Admission: CT CHEST ABDOMEN PELVIS WO CONTRAST  Result Date: 08/16/2023 CLINICAL DATA:  Dry mouth, nausea, emesis. Chills. Lethargy. Squamous cell cancer on top of head. Lower extremity swelling. EXAM: CT CHEST, ABDOMEN AND PELVIS WITHOUT CONTRAST TECHNIQUE: Multidetector CT imaging of the chest, abdomen and pelvis was performed following the standard protocol without IV contrast. RADIATION DOSE REDUCTION: This exam was performed according to the departmental dose-optimization program which includes automated exposure control, adjustment of the mA and/or kV according to patient size and/or use of iterative reconstruction technique. COMPARISON:  Same day chest radiograph FINDINGS: CT CHEST FINDINGS Cardiovascular: Coronary artery and aortic atherosclerotic calcification. No pericardial effusion. Dilated ascending aorta measuring 41 mm in diameter. Mediastinum/Nodes: Trachea and esophagus are unremarkable. No thoracic adenopathy noting  limitations of noncontrast exam. Lungs/Pleura: Bibasilar atelectasis/scarring. No pleural effusion or pneumothorax. Multiple bilateral pulmonary nodules. For example a left lower lobe subpleural nodule measures 8 x 4 mm on series 4/image 88. A left upper lobe perifissural nodule measures 5 x 7 mm on 4/54. 6 x 12 mm nodular thickening along or within the right major fissure (4/73). A right upper lobe perifissural nodule measures 5 x 6 mm on 4/41. Musculoskeletal: No acute fracture or destructive osseous lesion. CT ABDOMEN PELVIS FINDINGS  Hepatobiliary: Unremarkable noncontrast appearance of the liver, gallbladder, biliary tree. Pancreas: Unremarkable. Spleen: Unremarkable. Adrenals/Urinary Tract: Contrast from previous CT neck within the ureters and bladder. No hydronephrosis. Unremarkable adrenal glands. Stomach/Bowel: Normal caliber large and small bowel. No bowel wall thickening. Stomach and appendix are within normal limits. Vascular/Lymphatic: No significant vascular findings are present. No enlarged abdominal or pelvic lymph nodes. Reproductive: No acute abnormality. Other: No free intraperitoneal fluid or air. Musculoskeletal: No acute fracture or destructive osseous lesion. IMPRESSION: 1. No acute abnormality in the chest, abdomen, or pelvis. 2. Multiple bilateral pulmonary nodules measuring up to 9 mm (average dimension). Non-contrast chest CT at 3-6 months is recommended. If the nodules are stable at time of repeat CT, then future CT at 18-24 months (from today's scan) is considered optional for low-risk patients, but is recommended for high-risk patients. This recommendation follows the consensus statement: Guidelines for Management of Incidental Pulmonary Nodules Detected on CT Images: From the Fleischner Society 2017; Radiology 2017; 284:228-243. Aortic Atherosclerosis (ICD10-I70.0). Electronically Signed   By: Minerva Fester M.D.   On: 08/16/2023 20:43   CT Soft Tissue Neck W Contrast  Result Date: 08/16/2023 CLINICAL DATA:  Occult malignancy Left sided facial swelling, ? salivary tumor? No saliva production EXAM: CT NECK WITH CONTRAST TECHNIQUE: Multidetector CT imaging of the neck was performed using the standard protocol following the bolus administration of intravenous contrast. RADIATION DOSE REDUCTION: This exam was performed according to the departmental dose-optimization program which includes automated exposure control, adjustment of the mA and/or kV according to patient size and/or use of iterative reconstruction  technique. CONTRAST:  75mL OMNIPAQUE IOHEXOL 300 MG/ML  SOLN COMPARISON:  CT of the neck 01/18/2023. FINDINGS: Pharynx and larynx: Normal. No mass or swelling. Streak artifact from dental amalgam limits assessment. Salivary glands: No visible mass lesion or inflammatory change. No stone. Thyroid: Subcentimeter right thyroid nodule which does not require further imaging follow-up (ref: J Am Coll Radiol. 2015 Feb;12(2): 143-50). Lymph nodes: None enlarged or abnormal density. Vascular: Negative. Limited intracranial: Negative. Visualized orbits: Negative. Mastoids and visualized paranasal sinuses: Mild inferior maxillary sinus mucosal thickening. Otherwise, clear. Skeleton: No evidence of acute abnormality on limited assessment. Upper chest: Visualized lung apices are clear. IMPRESSION: No visible mass lesion or appreciable inflammatory change. If there is high clinical concern for malignancy, MRI face or neck with contrast could provide further evaluation. Electronically Signed   By: Feliberto Harts M.D.   On: 08/16/2023 18:41   DG Chest Portable 1 View  Result Date: 08/16/2023 CLINICAL DATA:  Weakness EXAM: PORTABLE CHEST 1 VIEW COMPARISON:  None Available. FINDINGS: The heart size and mediastinal contours are within normal limits. Both lungs are clear. The visualized skeletal structures are unremarkable. IMPRESSION: No active disease. Electronically Signed   By: Layla Maw M.D.   On: 08/16/2023 17:38   US Abdomen Limited RUQ (LIVER/GB)  Result Date: 08/16/2023 CLINICAL DATA:  Elevated LFTs EXAM: ULTRASOUND ABDOMEN LIMITED RIGHT UPPER QUADRANT COMPARISON:  None Available. FINDINGS: Gallbladder: No  gallstones or wall thickening visualized. No sonographic Murphy sign noted by sonographer. Common bile duct: Diameter: 6 mm Liver: Mildly increased echogenicity. No focal lesion. Portal vein is patent on color Doppler imaging with normal direction of blood flow towards the liver. Other: None. IMPRESSION: 1.  No cholelithiasis or sonographic evidence for acute cholecystitis. 2. Mildly increased hepatic parenchymal echogenicity suggestive of steatosis. Electronically Signed   By: Annia Belt M.D.   On: 08/16/2023 16:38    EKG: Independently reviewed.   Assessment and Plan: * AKI (acute kidney injury) (HCC) Probably pre-renal / ATN in setting of dehydration, acute illness. Cemiplimab can be nephrotoxic as well during treatment; however, he has been off of this med for 4 months now and had no history of nephrotoxicity while on the med.  22 day half life so it should be all but out of his system by now. IVF Strict intake and output No evidence of obstruction on todays CT Repeat CMP in AM Hold nephrotoxic meds  Pulmonary nodules Somewhat concerning to me, are the B pulmonary nodules on CT today which measure up to 9mm in size.  These sound either new or significantly enlarged when compared to CT in Feb this year at [facility] which only reported: "Scattered 2 to 3 mm calcified and noncalcified nodules in bilateral lungs, favor sequela prior granulomatous infection." DDx includes metastatic dz from his SCC of scalp, or possibly septic emboli from endocarditis he may have developed following removal of an infected tooth a couple of weeks ago.  Seems to have murmur on exam today.  This is reportedly new per his daughter who is an NP. Perhaps subacute endocarditis with a slower growing organism given lack of SIRS? Regarding possibility of metastatic dz: PCCM will see for pulm in AM.  Either perform work up here/outpt or follow up with his oncology team at [facility] with this information. Regarding possibility of endocarditis: BCx ordered with instructions to incubate for at least 5 days to r/o HACEK organisms. 2d echo ordered If either of above are suspicious, may need ID consult / TEE  Nausea and vomiting Possibly cause of dehydration / AKI? Unclear cause though working up endocarditis as noted above. Zofran  PRN  Squamous cell carcinoma of scalp Note: Our chart PMH says basal cell instead of squamous cell for some reason?  Looks like [facility] is consistently showing squamous cell however. With known recurrent disease that was managed by oncology at [facility]. Looks like they did some cycles of Cemiplimab earlier this year, last in April.  And were now following up with punch biopsy. See pulmonary nodule discussion above.  Murmur 2d echo pending, see pulm nodule discussion above.      Advance Care Planning:   Code Status: Full Code  Consults: PCCM will see for pulm in AM  Family Communication: Wife at bedside  Severity of Illness: The appropriate patient status for this patient is OBSERVATION. Observation status is judged to be reasonable and necessary in order to provide the required intensity of service to ensure the patient's safety. The patient's presenting symptoms, physical exam findings, and initial radiographic and laboratory data in the context of their medical condition is felt to place them at decreased risk for further clinical deterioration. Furthermore, it is anticipated that the patient will be medically stable for discharge from the hospital within 2 midnights of admission.   Author: Hillary Bow., DO 08/17/2023 2:41 AM  For on call review [email or web address].

## 2023-08-17 NOTE — Assessment & Plan Note (Addendum)
Probably pre-renal / ATN in setting of dehydration, acute illness. Cemiplimab can be nephrotoxic as well during treatment; however, he has been off of this med for 4 months now and had no history of nephrotoxicity while on the med.  22 day half life so it should be all but out of his system by now. IVF Strict intake and output No evidence of obstruction on todays CT Repeat CMP in AM Hold nephrotoxic meds

## 2023-08-17 NOTE — Progress Notes (Signed)
  Echocardiogram 2D Echocardiogram has been performed.  Milda Smart 08/17/2023, 12:46 PM

## 2023-08-17 NOTE — Consult Note (Signed)
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 He presented with two week hx of dry mouth, fatigue, nausea, non-bloody emesis and ankle swelling. Last week had right molar tooth extracted due to rotting.  In the ED he was afebrile and hemodynamically stable on room air. Labs significant for AKI and mildly elevated AST/ALT/AP. CT CAP with no acute abnormalities but noted multiple bilateral pulmonary nodules up to 9 mm. Admitted to [facility] for management of AKI, suspected pre-renal. PCCM consulted for evaluation of pulmonary nodules. He denies shortness of breath, cough or wheezing. 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 Non-smoker.  Daughter works at [employer]  Pertinent  Medical History  Squamous cell carcinoma of scalp with recurrence  Significant Hospital Events: Including procedures, antibiotic start and stop dates in addition to other pertinent events   [facility] CT Chest 02/02/23 (report only) FINDINGS:   LUNGS, AIRWAYS, AND PLEURA: Scattered 2 to 3 mm calcified and noncalcified nodules in bilateral lungs, favor sequela prior granulomatous infection. Subpleural triangular nodules in bilateral lungs, likely intrapulmonary lymph nodes. No suspicious nodules.. Central airways are patent. No pleural effusion or pneumothorax.   MEDIASTINUM AND LYMPH NODES: No enlarged intrathoracic, axillary, or supraclavicular lymph nodes.   HEART AND VASCULATURE: Heart is enlarged. With mild left atrial enlargement measuring 4.4 cm in AP diameter. Mild coronary artery calcifications. No pericardial effusion. Dilated ascending thoracic aorta measuring 4.1 cm in  diameter. Main pulmonary artery is normal in size.   CHEST WALL AND BONES: No suspicious lytic or sclerotic osseous lesions. Multilevel degenerative disc disease in the thoracic spine. Chest wall appears normal.   UPPER ABDOMEN: Numerous splenic calcifications, likely representing sequela of prior granulomatous disease.   IMPRESSION:   No thoracic metastatic disease.   CT CAP - Lung/Pleura reports "Lungs/Pleura: Bibasilar atelectasis/scarring. No pleural effusion or pneumothorax. Multiple bilateral pulmonary nodules. For example a left lower lobe subpleural nodule measures 8 x 4 mm on series 4/image 88. A left upper lobe perifissural nodule measures 5 x 7 mm on 4/54. 6 x 12 mm nodular thickening along or within the right major fissure (4/73). A right upper lobe perifissural nodule measures 5 x 6 mm on 4/41.  8/19 Admitted to [facility]. PCCM consulted for multiple pulmonary nodules. No imaging for comparison available   Interim History / Subjective:  As above  Objective   Blood pressure (!) 106/59, pulse 95, temperature 98.1 F (36.7 C), temperature source Oral, resp. rate 19, height 6\' 2"  (1.88 m), weight 85.7 kg, SpO2 94%.       No intake or output data in the 24 hours ending 08/17/23 0741 Filed Weights   08/16/23 1213  Weight: 85.7 kg   Physical Exam: General: Chronically ill and elderly-appearing, no acute distress HENT: Junior, AT, OP clear, MMM Eyes: EOMI, no scleral icterus Respiratory: Diminished but lear to auscultation bilaterally.  No crackles, wheezing or rales Cardiovascular: RRR, -M/R/G, no JVD Extremities: 1+ pitting edema,-tenderness Neuro: AAO x4, CNII-XII grossly intact Skin: Scalp lesion Psych: Normal mood, normal affect    Resolved Hospital Problem list   N/A  Assessment & Plan:  Bilateral pulmonary nodules, largest 9mm - Ddx benign  lymph nodes vs infectious/inflammatory. Cannot rule out malignancy though appearance of subpleural nodules appear benign.  On  prior [facility] imaging from 02/02/23 report includes comments regarding subpleural triangular nodules in bilateral lungs thought to be representative of intrapulmonary lymph nodes. These could be the same nodules seen on CT 08/16/23. Unfortunately no measurements documented. Would need to have digital imaging to compare for any changes since Feb 2024. Daughter and patient reports follow-up with Oncology at [facility] next week to determine timing for follow-up imaging. Would not recommend any interventional procedures at this time. For now, agree with repeat CT in 3-6 months unless his oncologist determines otherwise.  Daughter will request follow-up in the future if Pulmonary services in [place] needed.  Best Practice (right click and "Reselect all SmartList Selections" daily)  Per primary:   Diet/type: Regular consistency (see orders) DVT prophylaxis: LMWH GI prophylaxis: N/A Lines: N/A Foley:  N/A Code Status:  full code Last date of multidisciplinary goals of care discussion [per primary]  Labs   CBC: Recent Labs  Lab 08/16/23 1223 08/17/23 0527  WBC 8.7 7.6  HGB 11.2* 10.5*  HCT 33.1* 31.3*  MCV 97.1 98.7  PLT 283 257    Basic Metabolic Panel: Recent Labs  Lab 08/16/23 1223 08/17/23 0527  NA 130* 130*  K 4.9 4.5  CL 100 102  CO2 21* 19*  GLUCOSE 95 93  BUN 27* 24*  CREATININE 1.64* 1.68*  CALCIUM 8.9 8.2*   GFR: Estimated Creatinine Clearance: 38.7 mL/min (A) (by C-G formula based on SCr of 1.68 mg/dL (H)). Recent Labs  Lab 08/16/23 1223 08/17/23 0527  WBC 8.7 7.6    Liver Function Tests: Recent Labs  Lab 08/16/23 1223 08/17/23 0527  AST 92* 89*  ALT 65* 62*  ALKPHOS 137* 134*  BILITOT 1.3* 1.4*  PROT 6.6 5.8*  ALBUMIN 3.5 2.9*   Recent Labs  Lab 08/16/23 1223  LIPASE 85*   No results for input(s): "AMMONIA" in the last 168 hours.  ABG No results found for: "PHART", "PCO2ART", "PO2ART", "HCO3", "TCO2", "ACIDBASEDEF", "O2SAT"   Coagulation Profile: No  results for input(s): "INR", "PROTIME" in the last 168 hours.  Cardiac Enzymes: No results for input(s): "CKTOTAL", "CKMB", "CKMBINDEX", "TROPONINI" in the last 168 hours.  HbA1C: No results found for: "HGBA1C"  CBG: No results for input(s): "GLUCAP" in the last 168 hours.  Review of Systems:   Review of Systems  Constitutional:  Positive for malaise/fatigue. Negative for chills, diaphoresis, fever and weight loss.  HENT:  Negative for congestion.   Respiratory:  Negative for cough, hemoptysis, sputum production, shortness of breath and wheezing.   Cardiovascular:  Negative for chest pain, palpitations and leg swelling.  Gastrointestinal:  Positive for nausea and vomiting.     Past Medical History:  He,  has a past medical history of Basal cell cancer, Cystoid macular degeneration of left eye, and HZV (herpes zoster virus) post herpetic neuralgia.   Surgical History:   Past Surgical History:  Procedure Laterality Date   COLONOSCOPY WITH PROPOFOL N/A 03/13/2013   Procedure: COLONOSCOPY WITH PROPOFOL;  Surgeon: Charolett Bumpers, MD;  Location: [facility];  Service: Endoscopy;  Laterality: N/A;   IRRIGATION AND DEBRIDEMENT OF WOUND WITH SPLIT THICKNESS SKIN GRAFT Right 12/04/2022   Procedure: Operative debridement and preparation of 5X5 cm scalp wound, Integra placement;  Surgeon: Santiago Glad, MD;  Location: [facility];  Service: Plastics;  Laterality: Right;   Removal of skin cancer  Social History:   reports that he has never smoked. He has quit using smokeless tobacco. He reports current alcohol use. He reports that he does not use drugs.   Family History:  His family history is not on file.   Allergies No Known Allergies   Home Medications  Prior to Admission medications   Medication Sig Start Date End Date Taking? Authorizing Provider  valACYclovir (VALTREX) 1000 MG tablet  09/25/19   [provider]     Critical care time: N/A     Care Time: 40 min  Mechele Collin, M.D. [facility] Pulmonary/Critical Care Medicine 08/17/2023 7:51 AM   See Amion for personal pager For hours between 7 PM to 7 AM, please call Elink for urgent questions

## 2023-08-17 NOTE — Assessment & Plan Note (Addendum)
Possibly cause of dehydration / AKI? Unclear cause though working up endocarditis as noted above. Zofran PRN

## 2023-08-17 NOTE — Progress Notes (Signed)
   08/17/23 1359  TOC Brief Assessment  Insurance and Status Reviewed  Patient has primary care physician Yes  Home environment has been reviewed Home w/ spouse  Prior level of function: Independent  Prior/Current Home Services No current home services  Social Determinants of Health Reivew SDOH reviewed no interventions necessary  Readmission risk has been reviewed Yes  Transition of care needs no transition of care needs at this time

## 2023-08-17 NOTE — Assessment & Plan Note (Signed)
2d echo pending, see pulm nodule discussion above.

## 2023-08-17 NOTE — Assessment & Plan Note (Addendum)
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  Looks like [facility] is consistently showing squamous cell however. With known recurrent disease that was managed by oncology at [facility]. Looks like they did some cycles of Cemiplimab earlier this year, last in April.  And were now following up with punch biopsy. See pulmonary nodule discussion above.

## 2023-08-17 NOTE — Plan of Care (Signed)
  Problem: Clinical Measurements: Goal: Will remain free from infection Outcome: Progressing   Problem: Nutrition: Goal: Adequate nutrition will be maintained Outcome: Progressing   Problem: Skin Integrity: Goal: Risk for impaired skin integrity will decrease Outcome: Progressing   

## 2023-08-17 NOTE — Progress Notes (Addendum)
   Patient seen and examined at bedside, patient admitted after midnight, please see earlier detailed admission note by Lyda Perone, DO. Briefly, patient presented secondary to dry mouth, emesis, fatigue and bilateral leg edema and found to have evidence of AKI likely secondary to dehydration. IV fluids started.  Subjective: Patient reports continued dry mouth. Daughter is concerned about new murmur and LE edema.  BP 107/60 (BP Location: Left Arm)   Pulse 87   Temp 99.4 F (37.4 C) (Oral)   Resp 18   Ht 6\' 2"  (1.88 m)   Wt 85.7 kg   SpO2 97%   BMI 24.27 kg/m   General exam: Appears calm and comfortable Respiratory system: Clear to auscultation. Respiratory effort normal. Cardiovascular system: S1 & S2 heard, RRR. 1/6 systolic murmur. Gastrointestinal system: Abdomen is nondistended, soft and nontender.  Normal bowel sounds heard. Central nervous system: Alert and oriented. No focal neurological deficits. Musculoskeletal: BLE edema. No calf tenderness Skin: No cyanosis. BLE erythema noted consistent with dermatitis Psychiatry: Judgement and insight appear normal. Mood & affect appropriate.   Brief assessment/Plan:  AKI Baseline creatinine from outside records appears to be around 1.15. Presumed secondary to dehydration. Dry mouth discourages patient from taking PO as often as he should. He has had some retching because of his dry mouth when attempting oral intake. Creatinine of 1.64 on admission and is currently stable at 1.68. -Continue IV fluids  Nausea/vomiting In part related to patient's dry mouth, it seems. No current nausea. -Continue Zofran PRN  Pulmonary nodules Bilateral. Noted on imaging. Pulmonology consulted and recommend repeat CT in 3-6 months unless patient's oncologist determines otherwise.  Dry mouth Unclear if related to patient's therapy (cemiplimab) for his skin cancers. Dry mouth has led to poor oral intake and likely to his current  presentation. -Continue IV fluids until better tolerating oral intake  Hyponatremia -Continue IV fluids  Elevated AST/ALT Hyperbilirubinemia RUQ significant for steatosis. Otherwise unsure etiology. Asymptomatic. Possibly related to overall dehydration -CMP in AM  Murmur Concern for possible endocarditis on admission. Patient has a recent complicated tooth extraction. Daughter has also noted new LE edema. Transthoracic Echocardiogram ordered. -Follow-up Transthoracic Echocardiogram -ESR, CRP  Family communication: Daughter at bedside DVT prophylaxis: Lovenox Disposition: Discharge home likely in 24 hours pending improvement of AKI and Transthoracic Echocardiogram results  Jacquelin Hawking, MD [facility] 08/17/2023, 1:22 PM

## 2023-08-17 NOTE — Assessment & Plan Note (Addendum)
Somewhat concerning to me, are the B pulmonary nodules on CT today which measure up to 9mm in size.  These sound either new or significantly enlarged when compared to CT in Feb this year at [facility] which only reported: "Scattered 2 to 3 mm calcified and noncalcified nodules in bilateral lungs, favor sequela prior granulomatous infection." DDx includes metastatic dz from his SCC of scalp, or possibly septic emboli from endocarditis he may have developed following removal of an infected tooth a couple of weeks ago.  Seems to have murmur on exam today.  This is reportedly new per his daughter who is an NP. Perhaps subacute endocarditis with a slower growing organism given lack of SIRS? Regarding possibility of metastatic dz: PCCM will see for pulm in AM.  Either perform work up here/outpt or follow up with his oncology team at [facility] with this information. Regarding possibility of endocarditis: BCx ordered with instructions to incubate for at least 5 days to r/o HACEK organisms. 2d echo ordered If either of above are suspicious, may need ID consult / TEE

## 2023-08-17 NOTE — Progress Notes (Signed)
Mobility Specialist - Progress Note   08/17/23 1411  Mobility  Activity Ambulated independently in hallway  Level of Assistance Independent  Assistive Device None  Distance Ambulated (ft) 500 ft  Activity Response Tolerated well  Mobility Referral Yes  $Mobility charge 1 Mobility  Mobility Specialist Start Time (ACUTE ONLY) 0203  Mobility Specialist Stop Time (ACUTE ONLY) 0211  Mobility Specialist Time Calculation (min) (ACUTE ONLY) 8 min   Pt received in bed and agreeable to mobility. No complaints during session. Pt to bathroom after session with all needs met. Family in room.   [facility]

## 2023-08-18 DIAGNOSIS — C4442 Squamous cell carcinoma of skin of scalp and neck: Secondary | ICD-10-CM | POA: Diagnosis present

## 2023-08-18 DIAGNOSIS — R7989 Other specified abnormal findings of blood chemistry: Secondary | ICD-10-CM | POA: Diagnosis present

## 2023-08-18 DIAGNOSIS — N170 Acute kidney failure with tubular necrosis: Secondary | ICD-10-CM | POA: Diagnosis present

## 2023-08-18 DIAGNOSIS — Z923 Personal history of irradiation: Secondary | ICD-10-CM | POA: Diagnosis not present

## 2023-08-18 DIAGNOSIS — T451X5A Adverse effect of antineoplastic and immunosuppressive drugs, initial encounter: Secondary | ICD-10-CM | POA: Diagnosis present

## 2023-08-18 DIAGNOSIS — B029 Zoster without complications: Secondary | ICD-10-CM | POA: Diagnosis present

## 2023-08-18 DIAGNOSIS — D7212 Drug rash with eosinophilia and systemic symptoms syndrome: Secondary | ICD-10-CM | POA: Diagnosis present

## 2023-08-18 DIAGNOSIS — R112 Nausea with vomiting, unspecified: Secondary | ICD-10-CM | POA: Diagnosis present

## 2023-08-18 DIAGNOSIS — N179 Acute kidney failure, unspecified: Secondary | ICD-10-CM | POA: Diagnosis present

## 2023-08-18 DIAGNOSIS — Z85828 Personal history of other malignant neoplasm of skin: Secondary | ICD-10-CM | POA: Diagnosis not present

## 2023-08-18 DIAGNOSIS — R627 Adult failure to thrive: Secondary | ICD-10-CM | POA: Diagnosis present

## 2023-08-18 DIAGNOSIS — K760 Fatty (change of) liver, not elsewhere classified: Secondary | ICD-10-CM | POA: Diagnosis present

## 2023-08-18 DIAGNOSIS — E860 Dehydration: Secondary | ICD-10-CM | POA: Diagnosis present

## 2023-08-18 DIAGNOSIS — K117 Disturbances of salivary secretion: Secondary | ICD-10-CM | POA: Diagnosis present

## 2023-08-18 DIAGNOSIS — Z87891 Personal history of nicotine dependence: Secondary | ICD-10-CM | POA: Diagnosis not present

## 2023-08-18 DIAGNOSIS — D649 Anemia, unspecified: Secondary | ICD-10-CM | POA: Diagnosis present

## 2023-08-18 DIAGNOSIS — Z1152 Encounter for screening for COVID-19: Secondary | ICD-10-CM | POA: Diagnosis not present

## 2023-08-18 DIAGNOSIS — E871 Hypo-osmolality and hyponatremia: Secondary | ICD-10-CM | POA: Diagnosis present

## 2023-08-18 DIAGNOSIS — R918 Other nonspecific abnormal finding of lung field: Secondary | ICD-10-CM | POA: Diagnosis present

## 2023-08-18 DIAGNOSIS — M3500 Sicca syndrome, unspecified: Secondary | ICD-10-CM | POA: Diagnosis present

## 2023-08-18 DIAGNOSIS — K219 Gastro-esophageal reflux disease without esophagitis: Secondary | ICD-10-CM | POA: Diagnosis present

## 2023-08-18 LAB — CBC WITH DIFFERENTIAL/PLATELET
Abs Immature Granulocytes: 0.05 10*3/uL (ref 0.00–0.07)
Basophils Absolute: 0.1 10*3/uL (ref 0.0–0.1)
Basophils Relative: 1 %
Eosinophils Absolute: 1.4 10*3/uL — ABNORMAL HIGH (ref 0.0–0.5)
Eosinophils Relative: 19 %
HCT: 32 % — ABNORMAL LOW (ref 39.0–52.0)
Hemoglobin: 10.7 g/dL — ABNORMAL LOW (ref 13.0–17.0)
Immature Granulocytes: 1 %
Lymphocytes Relative: 18 %
Lymphs Abs: 1.3 10*3/uL (ref 0.7–4.0)
MCH: 32.9 pg (ref 26.0–34.0)
MCHC: 33.4 g/dL (ref 30.0–36.0)
MCV: 98.5 fL (ref 80.0–100.0)
Monocytes Absolute: 1.5 10*3/uL — ABNORMAL HIGH (ref 0.1–1.0)
Monocytes Relative: 20 %
Neutro Abs: 3.1 10*3/uL (ref 1.7–7.7)
Neutrophils Relative %: 41 %
Platelets: 258 10*3/uL (ref 150–400)
RBC: 3.25 MIL/uL — ABNORMAL LOW (ref 4.22–5.81)
RDW: 13.4 % (ref 11.5–15.5)
WBC: 7.4 10*3/uL (ref 4.0–10.5)
nRBC: 0 % (ref 0.0–0.2)

## 2023-08-18 LAB — URINALYSIS, ROUTINE W REFLEX MICROSCOPIC
Bilirubin Urine: NEGATIVE
Glucose, UA: NEGATIVE mg/dL
Hgb urine dipstick: NEGATIVE
Ketones, ur: NEGATIVE mg/dL
Leukocytes,Ua: NEGATIVE
Nitrite: NEGATIVE
Protein, ur: NEGATIVE mg/dL
Specific Gravity, Urine: 1.006 (ref 1.005–1.030)
pH: 7 (ref 5.0–8.0)

## 2023-08-18 LAB — COMPREHENSIVE METABOLIC PANEL
ALT: 62 U/L — ABNORMAL HIGH (ref 0–44)
AST: 91 U/L — ABNORMAL HIGH (ref 15–41)
Albumin: 2.8 g/dL — ABNORMAL LOW (ref 3.5–5.0)
Alkaline Phosphatase: 130 U/L — ABNORMAL HIGH (ref 38–126)
Anion gap: 9 (ref 5–15)
BUN: 16 mg/dL (ref 8–23)
CO2: 20 mmol/L — ABNORMAL LOW (ref 22–32)
Calcium: 8.6 mg/dL — ABNORMAL LOW (ref 8.9–10.3)
Chloride: 101 mmol/L (ref 98–111)
Creatinine, Ser: 1.54 mg/dL — ABNORMAL HIGH (ref 0.61–1.24)
GFR, Estimated: 44 mL/min — ABNORMAL LOW (ref >60)
Glucose, Bld: 95 mg/dL (ref 70–99)
Potassium: 4.4 mmol/L (ref 3.5–5.1)
Sodium: 130 mmol/L — ABNORMAL LOW (ref 135–145)
Total Bilirubin: 1.3 mg/dL — ABNORMAL HIGH (ref 0.3–1.2)
Total Protein: 5.7 g/dL — ABNORMAL LOW (ref 6.5–8.1)

## 2023-08-18 MED ORDER — BIOTENE DRY MOUTH MT LIQD: 15.0000 mL | OROMUCOSAL | Status: DC | PRN

## 2023-08-18 NOTE — Plan of Care (Signed)
  Problem: Education: Goal: Knowledge of General Education information will improve Description: Including pain rating scale, medication(s)/side effects and non-pharmacologic comfort measures Outcome: Progressing   Problem: Clinical Measurements: Goal: Ability to maintain clinical measurements within normal limits will improve Outcome: Progressing Goal: Will remain free from infection Outcome: Progressing Goal: Diagnostic test results will improve Outcome: Progressing Goal: Respiratory complications will improve Outcome: Progressing Goal: Cardiovascular complication will be avoided Outcome: Progressing   Problem: Elimination: Goal: Will not experience complications related to bowel motility Outcome: Progressing Goal: Will not experience complications related to urinary retention Outcome: Progressing   

## 2023-08-18 NOTE — Progress Notes (Signed)
PROGRESS NOTE    Sean Skinner  BMW:[id number] DOB: 05-Jan-1941 DOA: 08/16/2023 PCP: Sean Aspen, MD  Chief Complaint  Patient presents with   Dry Mouth   Emesis   Nausea    Brief Narrative:    Sean Skinner is Sean Skinner 82 y.o. 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   Pt presents to ED with 2 week h/o dry mouth, emesis, fatigue, BLE edema.   Patient says that for the past 2 weeks he has not had any saliva production and his mouth has been extremely dry. He has been drinking plenty of fluids, but has been unable to tolerate solid food. When he tries to eat, he feels nauseous and throws up the food immediately. No blood in the emesis. He has also felt cold and lethargic. Last week, he had Sean Skinner right molar tooth extracted because Sean Skinner crown fell off and the tooth was noted to be rotting underneath it. He denies any pain or discharge in the area of the extraction. He also notes new swelling in his ankles. No changes in bowel movements or urination. No fevers, chills, night sweats at home.   Assessment & Plan:   Principal Problem:   AKI (acute kidney injury) (HCC) Active Problems:   Pulmonary nodules   Squamous cell carcinoma of scalp   Nausea and vomiting   Murmur   Failure to thrive in adult  AKI Baseline creatinine from outside records appears to be around 1.13 (03/2023).  Presumed secondary to dehydration in the setting of dry mouth discourages patient from taking PO as often as he should.  Gradually improving today Will continue IVF and continue to trend  UA bland CT without hydro Daughter requested renal consult, I think we can continue supportive care for now with IVF, but will consult renal if needed/appropriate    Xerostomia Seems like this was rather sudden Sean Skinner few weeks ago.  Related to cemiplimab?  Related to  history of radiation?  Will call his oncologist to discuss.  May also reach out to Dr. Mitzi Skinner if he's available.  Biotene Additional workup as indicated Continue IV fluids until better tolerating oral intake  Nausea/vomiting In part related to patient's dry mouth, it seems. No current nausea. CT without acute abnormality -Continue Zofran PRN  Pulmonary nodules Bilateral. Noted on imaging. Pulmonology consulted and recommend repeat CT in 3-6 months unless patient's oncologist determines otherwise.   Hyponatremia -Continue IV fluids   Elevated AST/ALT Hyperbilirubinemia RUQ significant for steatosis. Otherwise unsure etiology. Asymptomatic. Possibly related to overall dehydration/hemodynamically mediated. -CMP in AM   Murmur Concern for possible endocarditis on admission -> blood cultures negative to date, echo without evidence of valvular vegetations.  Will continue to monitor.     DVT prophylaxis: lovenox Code Status: full Family Communication: none Disposition:   Status is: Inpatient Remains inpatient appropriate because: need for continued inpatient care   Consultants:  none  Procedures:  Echo IMPRESSIONS     1. Left ventricular ejection fraction, by estimation, is 60 to 65%. The  left ventricle has normal function. The left ventricle has no regional  wall motion abnormalities. There is mild concentric left ventricular  hypertrophy. Left ventricular diastolic  parameters were normal.   2. Right ventricular systolic function is normal. The right ventricular  size is mildly enlarged. There is normal pulmonary artery systolic  pressure. The estimated right ventricular systolic pressure is 25.8 mmHg.   3. The mitral valve is normal in structure. No evidence of mitral valve  regurgitation. No evidence of mitral stenosis.   4. The aortic valve is tricuspid. Aortic valve regurgitation is not  visualized. Aortic valve sclerosis/calcification is present, without any   evidence of aortic stenosis.   5. The inferior vena cava is dilated in size with >50% respiratory  variability, suggesting right atrial pressure of 8 mmHg.   Conclusion(s)/Recommendation(s): No evidence of valvular vegetations on  this transthoracic echocardiogram. Consider Sean Skinner transesophageal  echocardiogram to exclude infective endocarditis if clinically indicated.   Antimicrobials:  Anti-infectives (From admission, onward)    None       Subjective: C/o dry mouth   Objective: Vitals:   08/17/23 1152 08/17/23 2030 08/18/23 0616 08/18/23 1443  BP: 107/60 121/64 120/70 122/74  Pulse: 87 85 82 86  Resp: 18 18 18 16   Temp: 99.4 F (37.4 C) 98.8 F (37.1 C) 98.2 F (36.8 C) 97.8 F (36.6 C)  TempSrc: Oral Oral  Oral  SpO2: 97% 98% 97% 100%  Weight:      Height:        Intake/Output Summary (Last 24 hours) at 08/18/2023 1746 Last data filed at 08/18/2023 1220 Gross per 24 hour  Intake 480 ml  Output --  Net 480 ml   Filed Weights   08/16/23 1213  Weight: 85.7 kg    Examination:  General exam: Appears calm and comfortable  Respiratory system: unlabored Cardiovascular system: RRR Gastrointestinal system: Abdomen is nondistended, soft and nontender Central nervous system: Alert and oriented. No focal neurological deficits. Extremities: bilateral LE edema   Data Reviewed: I have personally reviewed following labs and imaging studies  CBC: Recent Labs  Lab 08/16/23 1223 08/17/23 0527 08/18/23 0554  WBC 8.7 7.6 7.4  NEUTROABS  --   --  3.1  HGB 11.2* 10.5* 10.7*  HCT 33.1* 31.3* 32.0*  MCV 97.1 98.7 98.5  PLT 283 257 258    Basic Metabolic Panel: Recent Labs  Lab 08/16/23 1223 08/17/23 0527 08/18/23 0554  NA 130* 130* 130*  K 4.9 4.5 4.4  CL 100 102 101  CO2 21* 19* 20*  GLUCOSE 95 93 95  BUN 27* 24* 16  CREATININE 1.64* 1.68* 1.54*  CALCIUM 8.9 8.2* 8.6*    GFR: Estimated Creatinine Clearance: 42.3 mL/min (Sean Skinner) (by C-G formula based on SCr  of 1.54 mg/dL (H)).  Liver Function Tests: Recent Labs  Lab 08/16/23 1223 08/17/23 0527 08/18/23 0554  AST 92* 89* 91*  ALT 65* 62* 62*  ALKPHOS 137* 134* 130*  BILITOT 1.3* 1.4* 1.3*  PROT 6.6 5.8* 5.7*  ALBUMIN 3.5 2.9* 2.8*    CBG: No results for input(s): "GLUCAP" in the last 168 hours.   Recent Results (from the past 240 hour(s))  SARS Coronavirus 2 by RT PCR (hospital order, performed in [facility] hospital lab) *cepheid single result test* Anterior Nasal Swab     Status: None   Collection Time: 08/16/23  3:17 PM   Specimen: Anterior Nasal Swab  Result Value Ref Range Status   SARS Coronavirus 2 by RT PCR NEGATIVE NEGATIVE Final    Comment: (NOTE) SARS-CoV-2 target nucleic acids are NOT DETECTED.  The SARS-CoV-2 RNA is generally detectable in upper and lower respiratory specimens during the acute phase of infection. The lowest concentration of SARS-CoV-2 viral copies this assay can detect is 250 copies / mL. Leverne Amrhein negative result  does not preclude SARS-CoV-2 infection and should not be used as the sole basis for treatment or other patient management decisions.  Mannie Ohlin negative result may occur with improper specimen collection / handling, submission of specimen other than nasopharyngeal swab, presence of viral mutation(s) within the areas targeted by this assay, and inadequate number of viral copies (<250 copies / mL). Aundra Pung negative result must be combined with clinical observations, patient history, and epidemiological information.  Fact Sheet for Patients:   RoadLapTop.co.za  Fact Sheet for Healthcare Providers: http://kim-miller.com/  This test is not yet approved or  cleared by the [place] FDA and has been authorized for detection and/or diagnosis of SARS-CoV-2 by FDA under an Emergency Use Authorization (EUA).  This EUA will remain in effect (meaning this test can be used) for the duration of the COVID-19 declaration  under Section 564(b)(1) of the Act, 21 U.S.C. section 360bbb-3(b)(1), unless the authorization is terminated or revoked sooner.  Performed at [facility], [address] [place], [place] [address]   Culture, blood (Routine X 2) w Reflex to ID Panel     Status: None (Preliminary result)   Collection Time: 08/17/23  5:27 AM   Specimen: BLOOD  Result Value Ref Range Status   Specimen Description   Final    BLOOD BLOOD RIGHT ARM Performed at [facility], [address] [place], [place] [address]    Special Requests   Final    BOTTLES DRAWN AEROBIC AND ANAEROBIC Blood Culture adequate volume Performed at [facility], [address] [place], [place] [address]    Culture   Final    NO GROWTH < 24 HOURS Performed at [facility], [address], [place], [place] [address]    Report Status PENDING  Incomplete  Culture, blood (Routine X 2) w Reflex to ID Panel     Status: None (Preliminary result)   Collection Time: 08/17/23  5:27 AM   Specimen: BLOOD  Result Value Ref Range Status   Specimen Description   Final    BLOOD BLOOD RIGHT ARM Performed at [facility], [address], [place], [place] [address]    Special Requests   Final    BOTTLES DRAWN AEROBIC AND ANAEROBIC Blood Culture adequate volume Performed at [facility], [address] [place], [place] [address]    Culture   Final    NO GROWTH < 24 HOURS Performed at [facility], [address] [place], [place] [address]    Report Status PENDING  Incomplete         Radiology Studies: ECHOCARDIOGRAM COMPLETE  Result Date: 08/17/2023    ECHOCARDIOGRAM REPORT   Patient Name:   Finian P Cegielski Date of Exam: 08/17/2023 Medical Rec #:  [id number]        Height:       74.0 in Accession #:    [id number]       Weight:       189.0 lb Date of Birth:  06/24/1941        BSA:          2.122 m Patient Age:    82 years          BP:           107/60 mmHg Patient Gender: M                HR:  90 bpm. Exam Location:  Inpatient Procedure: 2D Echo, Cardiac Doppler and Color Doppler Indications:    Murmur                 Endocarditis  History:        Patient has no prior history of Echocardiogram examinations.                 Signs/Symptoms:Fatigue and Edema.  Sonographer:    Milda Smart Referring Phys: [phone] JARED M GARDNER  Sonographer Comments: Image acquisition challenging due to respiratory motion. IMPRESSIONS  1. Left ventricular ejection fraction, by estimation, is 60 to 65%. The left ventricle has normal function. The left ventricle has no regional wall motion abnormalities. There is mild concentric left ventricular hypertrophy. Left ventricular diastolic parameters were normal.  2. Right ventricular systolic function is normal. The right ventricular size is mildly enlarged. There is normal pulmonary artery systolic pressure. The estimated right ventricular systolic pressure is 25.8 mmHg.  3. The mitral valve is normal in structure. No evidence of mitral valve regurgitation. No evidence of mitral stenosis.  4. The aortic valve is tricuspid. Aortic valve regurgitation is not visualized. Aortic valve sclerosis/calcification is present, without any evidence of aortic stenosis.  5. The inferior vena cava is dilated in size with >50% respiratory variability, suggesting right atrial pressure of 8 mmHg. Conclusion(s)/Recommendation(s): No evidence of valvular vegetations on this transthoracic echocardiogram. Consider Arlan Birks transesophageal echocardiogram to exclude infective endocarditis if clinically indicated. FINDINGS  Left Ventricle: Left ventricular ejection fraction, by estimation, is 60 to 65%. The left ventricle has normal function. The left ventricle has no regional wall motion abnormalities. The left ventricular internal cavity size was normal in size. There is  mild concentric left ventricular hypertrophy. Left ventricular  diastolic parameters were normal. Normal left ventricular filling pressure. Right Ventricle: The right ventricular size is mildly enlarged. No increase in right ventricular wall thickness. Right ventricular systolic function is normal. There is normal pulmonary artery systolic pressure. The tricuspid regurgitant velocity is 2.11  m/s, and with an assumed right atrial pressure of 8 mmHg, the estimated right ventricular systolic pressure is 25.8 mmHg. Left Atrium: Left atrial size was normal in size. Right Atrium: Right atrial size was normal in size. Pericardium: There is no evidence of pericardial effusion. Mitral Valve: The mitral valve is normal in structure. No evidence of mitral valve regurgitation. No evidence of mitral valve stenosis. Tricuspid Valve: The tricuspid valve is normal in structure. Tricuspid valve regurgitation is not demonstrated. No evidence of tricuspid stenosis. Aortic Valve: The aortic valve is tricuspid. Aortic valve regurgitation is not visualized. Aortic valve sclerosis/calcification is present, without any evidence of aortic stenosis. Pulmonic Valve: The pulmonic valve was normal in structure. Pulmonic valve regurgitation is trivial. No evidence of pulmonic stenosis. Aorta: The aortic root is normal in size and structure. Venous: The inferior vena cava is dilated in size with greater than 50% respiratory variability, suggesting right atrial pressure of 8 mmHg. IAS/Shunts: No atrial level shunt detected by color flow Doppler.  LEFT VENTRICLE PLAX 2D LVIDd:         4.50 cm     Diastology LVIDs:         3.30 cm     LV e' medial:    7.72 cm/s LV PW:         1.30 cm     LV E/e' medial:  7.7 LV IVS:        1.20 cm  LV e' lateral:   11.90 cm/s LVOT diam:     2.10 cm     LV E/e' lateral: 5.0 LV SV:         82 LV SV Index:   39 LVOT Area:     3.46 cm  LV Volumes (MOD) LV vol d, MOD A2C: 95.6 ml LV vol d, MOD A4C: 99.3 ml LV vol s, MOD A2C: 32.7 ml LV vol s, MOD A4C: 32.2 ml LV SV MOD A2C:      62.9 ml LV SV MOD A4C:     99.3 ml LV SV MOD BP:      65.5 ml RIGHT VENTRICLE             IVC RV Basal diam:  4.30 cm     IVC diam: 2.20 cm RV Mid diam:    3.90 cm RV S prime:     14.80 cm/s TAPSE (M-mode): 3.2 cm LEFT ATRIUM             Index        RIGHT ATRIUM           Index LA diam:        4.00 cm 1.89 cm/m   RA Area:     13.90 cm LA Vol (A2C):   47.6 ml 22.43 ml/m  RA Volume:   32.50 ml  15.32 ml/m LA Vol (A4C):   40.5 ml 19.09 ml/m LA Biplane Vol: 47.3 ml 22.29 ml/m  AORTIC VALVE LVOT Vmax:   129.00 cm/s LVOT Vmean:  85.000 cm/s LVOT VTI:    0.237 m  AORTA Ao Root diam: 3.30 cm Ao Asc diam:  4.00 cm MITRAL VALVE               TRICUSPID VALVE MV Area (PHT): 2.34 cm    TR Peak grad:   17.8 mmHg MV Decel Time: 324 msec    TR Vmax:        211.00 cm/s MV E velocity: 59.60 cm/s MV Maicol Bowland velocity: 69.40 cm/s  SHUNTS MV E/Darnice Comrie ratio:  0.86        Systemic VTI:  0.24 m                            Systemic Diam: 2.10 cm Armanda Magic MD Electronically signed by Armanda Magic MD Signature Date/Time: 08/17/2023/1:00:49 PM    Final    CT CHEST ABDOMEN PELVIS WO CONTRAST  Result Date: 08/16/2023 CLINICAL DATA:  Dry mouth, nausea, emesis. Chills. Lethargy. Squamous cell cancer on top of head. Lower extremity swelling. EXAM: CT CHEST, ABDOMEN AND PELVIS WITHOUT CONTRAST TECHNIQUE: Multidetector CT imaging of the chest, abdomen and pelvis was performed following the standard protocol without IV contrast. RADIATION DOSE REDUCTION: This exam was performed according to the departmental dose-optimization program which includes automated exposure control, adjustment of the mA and/or kV according to patient size and/or use of iterative reconstruction technique. COMPARISON:  Same day chest radiograph FINDINGS: CT CHEST FINDINGS Cardiovascular: Coronary artery and aortic atherosclerotic calcification. No pericardial effusion. Dilated ascending aorta measuring 41 mm in diameter. Mediastinum/Nodes: Trachea and esophagus are  unremarkable. No thoracic adenopathy noting limitations of noncontrast exam. Lungs/Pleura: Bibasilar atelectasis/scarring. No pleural effusion or pneumothorax. Multiple bilateral pulmonary nodules. For example Loie Jahr left lower lobe subpleural nodule measures 8 x 4 mm on series 4/image 88. Macaila Tahir left upper lobe perifissural nodule measures 5 x 7 mm on 4/54. 6 x 12 mm  nodular thickening along or within the right major fissure (4/73). Chantilly Linskey right upper lobe perifissural nodule measures 5 x 6 mm on 4/41. Musculoskeletal: No acute fracture or destructive osseous lesion. CT ABDOMEN PELVIS FINDINGS Hepatobiliary: Unremarkable noncontrast appearance of the liver, gallbladder, biliary tree. Pancreas: Unremarkable. Spleen: Unremarkable. Adrenals/Urinary Tract: Contrast from previous CT neck within the ureters and bladder. No hydronephrosis. Unremarkable adrenal glands. Stomach/Bowel: Normal caliber large and small bowel. No bowel wall thickening. Stomach and appendix are within normal limits. Vascular/Lymphatic: No significant vascular findings are present. No enlarged abdominal or pelvic lymph nodes. Reproductive: No acute abnormality. Other: No free intraperitoneal fluid or air. Musculoskeletal: No acute fracture or destructive osseous lesion. IMPRESSION: 1. No acute abnormality in the chest, abdomen, or pelvis. 2. Multiple bilateral pulmonary nodules measuring up to 9 mm (average dimension). Non-contrast chest CT at 3-6 months is recommended. If the nodules are stable at time of repeat CT, then future CT at 18-24 months (from today's scan) is considered optional for low-risk patients, but is recommended for high-risk patients. This recommendation follows the consensus statement: Guidelines for Management of Incidental Pulmonary Nodules Detected on CT Images: From the Fleischner Society 2017; Radiology 2017; 284:228-243. Aortic Atherosclerosis (ICD10-I70.0). Electronically Signed   By: Minerva Fester M.D.   On: 08/16/2023 20:43         Scheduled Meds:  enoxaparin (LOVENOX) injection  40 mg Subcutaneous Q24H   Continuous Infusions:  lactated ringers 125 mL/hr at 08/18/23 1005     LOS: 0 days    Time spent: over 30 min    Lacretia Nicks, MD [facility]   To contact the attending provider between 7A-7P or the covering provider during after hours 7P-7A, please log into the web site [email or web address] and access using universal [place] password for that web site. If you do not have the password, please call the hospital operator.  08/18/2023, 5:46 PM

## 2023-08-18 NOTE — Plan of Care (Signed)

## 2023-08-18 NOTE — Progress Notes (Signed)
Mobility Specialist - Progress Note   08/18/23 0925  Mobility  Activity Ambulated with assistance in hallway  Level of Assistance Independent after set-up  Assistive Device None  Distance Ambulated (ft) 1000 ft  Range of Motion/Exercises Active  Activity Response Tolerated well  Mobility Referral Yes  $Mobility charge 1 Mobility  Mobility Specialist Start Time (ACUTE ONLY) 0910  Mobility Specialist Stop Time (ACUTE ONLY) 0920  Mobility Specialist Time Calculation (min) (ACUTE ONLY) 10 min   Pt received in bed and agreed to mobility. Had no issues throughout session and returned to bed with all needs met.  Marilynne Halsted Mobility Specialist

## 2023-08-19 DIAGNOSIS — N179 Acute kidney failure, unspecified: Secondary | ICD-10-CM | POA: Diagnosis not present

## 2023-08-19 LAB — CBC WITH DIFFERENTIAL/PLATELET
Abs Immature Granulocytes: 0.04 10*3/uL (ref 0.00–0.07)
Abs Immature Granulocytes: 0.05 10*3/uL (ref 0.00–0.07)
Basophils Absolute: 0.1 10*3/uL (ref 0.0–0.1)
Basophils Absolute: 0.1 10*3/uL (ref 0.0–0.1)
Basophils Relative: 1 %
Basophils Relative: 1 %
Eosinophils Absolute: 1.5 10*3/uL — ABNORMAL HIGH (ref 0.0–0.5)
Eosinophils Absolute: 1.4 10*3/uL — ABNORMAL HIGH (ref 0.0–0.5)
Eosinophils Relative: 22 %
Eosinophils Relative: 21 %
HCT: 29.2 % — ABNORMAL LOW (ref 39.0–52.0)
HCT: 31 % — ABNORMAL LOW (ref 39.0–52.0)
Hemoglobin: 10 g/dL — ABNORMAL LOW (ref 13.0–17.0)
Hemoglobin: 10.5 g/dL — ABNORMAL LOW (ref 13.0–17.0)
Immature Granulocytes: 1 %
Immature Granulocytes: 1 %
Lymphocytes Relative: 19 %
Lymphocytes Relative: 18 %
Lymphs Abs: 1.3 10*3/uL (ref 0.7–4.0)
Lymphs Abs: 1.2 10*3/uL (ref 0.7–4.0)
MCH: 33.6 pg (ref 26.0–34.0)
MCH: 33.3 pg (ref 26.0–34.0)
MCHC: 34.2 g/dL (ref 30.0–36.0)
MCHC: 33.9 g/dL (ref 30.0–36.0)
MCV: 98 fL (ref 80.0–100.0)
MCV: 98.4 fL (ref 80.0–100.0)
Monocytes Absolute: 1.4 10*3/uL — ABNORMAL HIGH (ref 0.1–1.0)
Monocytes Absolute: 1.1 10*3/uL — ABNORMAL HIGH (ref 0.1–1.0)
Monocytes Relative: 20 %
Monocytes Relative: 16 %
Neutro Abs: 2.7 10*3/uL (ref 1.7–7.7)
Neutro Abs: 2.9 10*3/uL (ref 1.7–7.7)
Neutrophils Relative %: 37 %
Neutrophils Relative %: 43 %
Platelets: 240 10*3/uL (ref 150–400)
Platelets: 226 10*3/uL (ref 150–400)
RBC: 2.98 MIL/uL — ABNORMAL LOW (ref 4.22–5.81)
RBC: 3.15 MIL/uL — ABNORMAL LOW (ref 4.22–5.81)
RDW: 13.2 % (ref 11.5–15.5)
RDW: 13.2 % (ref 11.5–15.5)
WBC: 6.9 10*3/uL (ref 4.0–10.5)
WBC: 6.7 10*3/uL (ref 4.0–10.5)
nRBC: 0 % (ref 0.0–0.2)
nRBC: 0 % (ref 0.0–0.2)

## 2023-08-19 LAB — COMPREHENSIVE METABOLIC PANEL
ALT: 57 U/L — ABNORMAL HIGH (ref 0–44)
AST: 79 U/L — ABNORMAL HIGH (ref 15–41)
Albumin: 2.7 g/dL — ABNORMAL LOW (ref 3.5–5.0)
Alkaline Phosphatase: 122 U/L (ref 38–126)
Anion gap: 9 (ref 5–15)
BUN: 15 mg/dL (ref 8–23)
CO2: 19 mmol/L — ABNORMAL LOW (ref 22–32)
Calcium: 7.9 mg/dL — ABNORMAL LOW (ref 8.9–10.3)
Chloride: 99 mmol/L (ref 98–111)
Creatinine, Ser: 1.51 mg/dL — ABNORMAL HIGH (ref 0.61–1.24)
GFR, Estimated: 46 mL/min — ABNORMAL LOW (ref >60)
Glucose, Bld: 94 mg/dL (ref 70–99)
Potassium: 4 mmol/L (ref 3.5–5.1)
Sodium: 127 mmol/L — ABNORMAL LOW (ref 135–145)
Total Bilirubin: 1.5 mg/dL — ABNORMAL HIGH (ref 0.3–1.2)
Total Protein: 5.3 g/dL — ABNORMAL LOW (ref 6.5–8.1)

## 2023-08-19 LAB — VITAMIN B12: Vitamin B-12: 809 pg/mL (ref 180–914)

## 2023-08-19 LAB — BASIC METABOLIC PANEL
Anion gap: 6 (ref 5–15)
BUN: 13 mg/dL (ref 8–23)
CO2: 20 mmol/L — ABNORMAL LOW (ref 22–32)
Calcium: 7.6 mg/dL — ABNORMAL LOW (ref 8.9–10.3)
Chloride: 105 mmol/L (ref 98–111)
Creatinine, Ser: 1.46 mg/dL — ABNORMAL HIGH (ref 0.61–1.24)
GFR, Estimated: 47 mL/min — ABNORMAL LOW (ref >60)
Glucose, Bld: 114 mg/dL — ABNORMAL HIGH (ref 70–99)
Potassium: 4 mmol/L (ref 3.5–5.1)
Sodium: 131 mmol/L — ABNORMAL LOW (ref 135–145)

## 2023-08-19 LAB — TECHNOLOGIST SMEAR REVIEW

## 2023-08-19 LAB — PHOSPHORUS: Phosphorus: 4 mg/dL (ref 2.5–4.6)

## 2023-08-19 LAB — BRAIN NATRIURETIC PEPTIDE: B Natriuretic Peptide: 130.9 pg/mL — ABNORMAL HIGH (ref 0.0–100.0)

## 2023-08-19 LAB — TSH: TSH: 9.313 u[IU]/mL — ABNORMAL HIGH (ref 0.350–4.500)

## 2023-08-19 LAB — MAGNESIUM: Magnesium: 1.7 mg/dL (ref 1.7–2.4)

## 2023-08-19 MED ORDER — SODIUM CHLORIDE 0.9 % IV SOLN: INTRAVENOUS | Status: DC

## 2023-08-19 NOTE — Plan of Care (Signed)

## 2023-08-19 NOTE — Progress Notes (Signed)
PROGRESS NOTE    Sean Skinner  ZOX:[id number] DOB: Jul 18, 1941 DOA: 08/16/2023 PCP: Emilio Aspen, MD  Chief Complaint  Patient presents with   Dry Mouth   Emesis   Nausea    Brief Narrative:    Sean Skinner is Sean Skinner 82 y.o. 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   Pt presents to ED with 2 week h/o dry mouth, emesis, fatigue, BLE edema.   Patient says that for the past 2 weeks he has not had any saliva production and his mouth has been extremely dry. He has been drinking plenty of fluids, but has been unable to tolerate solid food. When he tries to eat, he feels nauseous and throws up the food immediately. No blood in the emesis. He has also felt cold and lethargic. Last week, he had Sean Skinner right molar tooth extracted because Sean Skinner crown fell off and the tooth was noted to be rotting underneath it. He denies any pain or discharge in the area of the extraction. He also notes new swelling in his ankles. No changes in bowel movements or urination. No fevers, chills, night sweats at home.   Assessment & Plan:   Principal Problem:   AKI (acute kidney injury) (HCC) Active Problems:   Pulmonary nodules   Squamous cell carcinoma of scalp   Nausea and vomiting   Murmur   Failure to thrive in adult  AKI  Hyponatremia Baseline creatinine from outside records appears to be around 1.13 (03/2023).  Presumed secondary to dehydration in the setting of dry mouth discourages patient from taking PO as often as he should.  Gradually improving today Will continue IVF (transition to normal saline) and continue to trend  UA bland CT without hydro Daughter requested renal consult, I think we can continue supportive care for now with IVF, but will consult renal if needed/appropriate    Xerostomia Seems like this was rather sudden [name] few weeks  ago.  Related to cemiplimab -> possible per discussion with her oncologist, possible delayed immune related adverse event?  Related to history of radiation (unlikely per discussion with Dr. [name]).  Will call his oncologist to discuss.  May also reach out to Dr. [name] if he's available.  Biotene Additional workup as indicated -> will follow sjogren's labs, TSH (elevated - follow free T4) Symptomatic management of dry mouth  Continue IV fluids until better tolerating oral intake  Eosinophilia Could this be dress like picture (time course doesn't fit)?  Of note, dress is listed as known adverse effect, though time course is Jaeli Grubb little long for dress as last cycle of cemiplimab was at the end of April.  Has mild erythema to lower extremities, otherwise no notable rash.  Does have abnormal LFT's and kidney function.  Could consider steroids, will hold for now with mild and improving LFT's as well as stable renal function. Follow smear, b12 May consider discussion with heme here  Nausea/vomiting In part related to patient's dry mouth, it seems. No current nausea. CT without acute abnormality -Continue Zofran PRN  Pulmonary nodules Bilateral. Noted on imaging. Pulmonology consulted and recommend repeat CT in 3-6 months unless patient's oncologist determines otherwise.   Hyponatremia -Continue IV fluids   Elevated AST/ALT Hyperbilirubinemia RUQ significant for steatosis. Otherwise unsure etiology. Asymptomatic. Possibly related to overall dehydration/hemodynamically mediated. -CMP in AM   Murmur  Concern for possible endocarditis on admission -> blood cultures negative to date, echo without evidence of valvular vegetations.  Will continue to monitor.     DVT prophylaxis: lovenox Code Status: full Family Communication: none Disposition:   Status is: Inpatient Remains inpatient appropriate because: need for continued inpatient care   Consultants:  none  Procedures:  Echo IMPRESSIONS      1. Left ventricular ejection fraction, by estimation, is 60 to 65%. The  left ventricle has normal function. The left ventricle has no regional  wall motion abnormalities. There is mild concentric left ventricular  hypertrophy. Left ventricular diastolic  parameters were normal.   2. Right ventricular systolic function is normal. The right ventricular  size is mildly enlarged. There is normal pulmonary artery systolic  pressure. The estimated right ventricular systolic pressure is 25.8 mmHg.   3. The mitral valve is normal in structure. No evidence of mitral valve  regurgitation. No evidence of mitral stenosis.   4. The aortic valve is tricuspid. Aortic valve regurgitation is not  visualized. Aortic valve sclerosis/calcification is present, without any  evidence of aortic stenosis.   5. The inferior vena cava is dilated in size with >50% respiratory  variability, suggesting right atrial pressure of 8 mmHg.   Conclusion(s)/Recommendation(s): No evidence of valvular vegetations on  this transthoracic echocardiogram. Consider Cadi Rhinehart transesophageal  echocardiogram to exclude infective endocarditis if clinically indicated.   Antimicrobials:  Anti-infectives (From admission, onward)    None       Subjective: Continued complaints of dry mouth   Objective: Vitals:   08/18/23 1443 08/18/23 1936 08/19/23 0624 08/19/23 1327  BP: 122/74 119/81 123/72 121/73  Pulse: 86 98 86 89  Resp: 16 20 17 16   Temp: 97.8 F (36.6 C)  98.1 F (36.7 C) 98.1 F (36.7 C)  TempSrc: Oral  Oral   SpO2: 100% 98% 97% 96%  Weight:      Height:        Intake/Output Summary (Last 24 hours) at 08/19/2023 1705 Last data filed at 08/19/2023 1031 Gross per 24 hour  Intake 240 ml  Output --  Net 240 ml   Filed Weights   08/16/23 1213  Weight: 85.7 kg    Examination:  General: No acute distress. Dry mouth  Cardiovascular: RRR Abdomen: Soft, nontender, nondistended  Neurological: Alert and  oriented 3. Moves all extremities 4 with equal strength. Cranial nerves II through XII grossly intact. Extremities: bilateral LE edema    Data Reviewed: I have personally reviewed following labs and imaging studies  CBC: Recent Labs  Lab 08/16/23 1223 08/17/23 0527 08/18/23 0554 08/19/23 0525  WBC 8.7 7.6 7.4 6.9  NEUTROABS  --   --  3.1 2.7  HGB 11.2* 10.5* 10.7* 10.0*  HCT 33.1* 31.3* 32.0* 29.2*  MCV 97.1 98.7 98.5 98.0  PLT 283 257 258 240    Basic Metabolic Panel: Recent Labs  Lab 08/16/23 1223 08/17/23 0527 08/18/23 0554 08/19/23 0525  NA 130* 130* 130* 127*  K 4.9 4.5 4.4 4.0  CL 100 102 101 99  CO2 21* 19* 20* 19*  GLUCOSE 95 93 95 94  BUN 27* 24* 16 15  CREATININE 1.64* 1.68* 1.54* 1.51*  CALCIUM 8.9 8.2* 8.6* 7.9*  MG  --   --   --  1.7  PHOS  --   --   --  4.0    GFR: Estimated Creatinine Clearance: 43.1 mL/min (Ashira Kirsten) (by C-G formula based on SCr of 1.51 mg/dL (H)).  Liver Function Tests: Recent Labs  Lab 08/16/23 1223 08/17/23 0527 08/18/23 0554 08/19/23 0525  AST 92* 89* 91* 79*  ALT 65* 62* 62* 57*  ALKPHOS 137* 134* 130* 122  BILITOT 1.3* 1.4* 1.3* 1.5*  PROT 6.6 5.8* 5.7* 5.3*  ALBUMIN 3.5 2.9* 2.8* 2.7*    CBG: No results for input(s): "GLUCAP" in the last 168 hours.   Recent Results (from the past 240 hour(s))  SARS Coronavirus 2 by RT PCR (hospital order, performed in [facility] hospital lab) *cepheid single result test* Anterior Nasal Swab     Status: None   Collection Time: 08/16/23  3:17 PM   Specimen: Anterior Nasal Swab  Result Value Ref Range Status   SARS Coronavirus 2 by RT PCR NEGATIVE NEGATIVE Final    Comment: (NOTE) SARS-CoV-2 target nucleic acids are NOT DETECTED.  The SARS-CoV-2 RNA is generally detectable in upper and lower respiratory specimens during the acute phase of infection. The lowest concentration of SARS-CoV-2 viral copies this assay can detect is 250 copies / mL. Carlisa Eble negative result does not preclude  SARS-CoV-2 infection and should not be used as the sole basis for treatment or other patient management decisions.  Tabia Landowski negative result may occur with improper specimen collection / handling, submission of specimen other than nasopharyngeal swab, presence of viral mutation(s) within the areas targeted by this assay, and inadequate number of viral copies (<250 copies / mL). Farmer Mccahill negative result must be combined with clinical observations, patient history, and epidemiological information.  Fact Sheet for Patients:   RoadLapTop.co.za  Fact Sheet for Healthcare Providers: http://kim-miller.com/  This test is not yet approved or  cleared by the [place] FDA and has been authorized for detection and/or diagnosis of SARS-CoV-2 by FDA under an Emergency Use Authorization (EUA).  This EUA will remain in effect (meaning this test can be used) for the duration of the COVID-19 declaration under Section 564(b)(1) of the Act, 21 U.S.C. section 360bbb-3(b)(1), unless the authorization is terminated or revoked sooner.  Performed at [facility], [address], [place], [place] [address]   Culture, blood (Routine X 2) w Reflex to ID Panel     Status: None (Preliminary result)   Collection Time: 08/17/23  5:27 AM   Specimen: BLOOD  Result Value Ref Range Status   Specimen Description   Final    BLOOD BLOOD RIGHT ARM Performed at [facility], [address], [place], [place] [address]    Special Requests   Final    BOTTLES DRAWN AEROBIC AND ANAEROBIC Blood Culture adequate volume Performed at [facility], [address], [place], [place] [address]    Culture   Final    NO GROWTH 2 DAYS Performed at [facility], [address], [place], [place] [address]    Report Status PENDING  Incomplete  Culture, blood (Routine X 2) w Reflex to ID Panel     Status: None (Preliminary result)    Collection Time: 08/17/23  5:27 AM   Specimen: BLOOD  Result Value Ref Range Status   Specimen Description   Final    BLOOD BLOOD RIGHT ARM Performed at [facility], [address] [place], [place] [address]    Special Requests   Final    BOTTLES DRAWN AEROBIC AND ANAEROBIC Blood Culture adequate volume Performed at [facility], [address] [place], [place] [address]    Culture   Final    NO  GROWTH 2 DAYS Performed at [facility], [address], [place], [place] [address]    Report Status PENDING  Incomplete         Radiology Studies: No results found.      Scheduled Meds:  enoxaparin (LOVENOX) injection  40 mg Subcutaneous Q24H   Continuous Infusions:  sodium chloride 125 mL/hr at 08/19/23 1500     LOS: 1 day    Time spent: over 30 min    Lacretia Nicks, MD [facility]   To contact the attending provider between 7A-7P or the covering provider during after hours 7P-7A, please log into the web site [email or web address] and access using universal [place] password for that web site. If you do not have the password, please call the hospital operator.  08/19/2023, 5:05 PM

## 2023-08-19 NOTE — Plan of Care (Signed)

## 2023-08-19 NOTE — Progress Notes (Signed)
Mobility Specialist - Progress Note   08/19/23 1053  Mobility  Activity Ambulated with assistance in hallway  Level of Assistance Modified independent, requires aide device or extra time  Assistive Device Other (Comment) (IV Pole)  Distance Ambulated (ft) 1000 ft  Activity Response Tolerated well  Mobility Referral Yes  $Mobility charge 1 Mobility  Mobility Specialist Start Time (ACUTE ONLY) 1042  Mobility Specialist Stop Time (ACUTE ONLY) 1053  Mobility Specialist Time Calculation (min) (ACUTE ONLY) 11 min   Pt received in bed and agreeable to mobility. No complaints during session. Pt to bathroom after session with all needs met. Wife in room.  [facility]

## 2023-08-20 DIAGNOSIS — N179 Acute kidney failure, unspecified: Secondary | ICD-10-CM | POA: Diagnosis not present

## 2023-08-20 LAB — COMPREHENSIVE METABOLIC PANEL
ALT: 48 U/L — ABNORMAL HIGH (ref 0–44)
AST: 71 U/L — ABNORMAL HIGH (ref 15–41)
Albumin: 2.6 g/dL — ABNORMAL LOW (ref 3.5–5.0)
Alkaline Phosphatase: 115 U/L (ref 38–126)
Anion gap: 6 (ref 5–15)
BUN: 13 mg/dL (ref 8–23)
CO2: 19 mmol/L — ABNORMAL LOW (ref 22–32)
Calcium: 7.4 mg/dL — ABNORMAL LOW (ref 8.9–10.3)
Chloride: 106 mmol/L (ref 98–111)
Creatinine, Ser: 1.48 mg/dL — ABNORMAL HIGH (ref 0.61–1.24)
GFR, Estimated: 47 mL/min — ABNORMAL LOW (ref >60)
Glucose, Bld: 90 mg/dL (ref 70–99)
Potassium: 3.9 mmol/L (ref 3.5–5.1)
Sodium: 131 mmol/L — ABNORMAL LOW (ref 135–145)
Total Bilirubin: 1.3 mg/dL — ABNORMAL HIGH (ref 0.3–1.2)
Total Protein: 5.1 g/dL — ABNORMAL LOW (ref 6.5–8.1)

## 2023-08-20 LAB — MAGNESIUM: Magnesium: 1.6 mg/dL — ABNORMAL LOW (ref 1.7–2.4)

## 2023-08-20 LAB — CREATININE, URINE, RANDOM: Creatinine, Urine: 80 mg/dL

## 2023-08-20 LAB — CBC WITH DIFFERENTIAL/PLATELET
Abs Immature Granulocytes: 0.04 10*3/uL (ref 0.00–0.07)
Basophils Absolute: 0.1 10*3/uL (ref 0.0–0.1)
Basophils Relative: 1 %
Eosinophils Absolute: 1.5 10*3/uL — ABNORMAL HIGH (ref 0.0–0.5)
Eosinophils Relative: 23 %
HCT: 28.6 % — ABNORMAL LOW (ref 39.0–52.0)
Hemoglobin: 9.8 g/dL — ABNORMAL LOW (ref 13.0–17.0)
Immature Granulocytes: 1 %
Lymphocytes Relative: 21 %
Lymphs Abs: 1.4 10*3/uL (ref 0.7–4.0)
MCH: 33.1 pg (ref 26.0–34.0)
MCHC: 34.3 g/dL (ref 30.0–36.0)
MCV: 96.6 fL (ref 80.0–100.0)
Monocytes Absolute: 1.2 10*3/uL — ABNORMAL HIGH (ref 0.1–1.0)
Monocytes Relative: 18 %
Neutro Abs: 2.5 10*3/uL (ref 1.7–7.7)
Neutrophils Relative %: 36 %
Platelets: 229 10*3/uL (ref 150–400)
RBC: 2.96 MIL/uL — ABNORMAL LOW (ref 4.22–5.81)
RDW: 13.2 % (ref 11.5–15.5)
WBC: 6.7 10*3/uL (ref 4.0–10.5)
nRBC: 0 % (ref 0.0–0.2)

## 2023-08-20 LAB — PHOSPHORUS: Phosphorus: 3.8 mg/dL (ref 2.5–4.6)

## 2023-08-20 LAB — SJOGRENS SYNDROME-A EXTRACTABLE NUCLEAR ANTIBODY: SSA (Ro) (ENA) Antibody, IgG: <0.2 AI (ref 0.0–0.9)

## 2023-08-20 LAB — T4, FREE: Free T4: 1.09 ng/dL (ref 0.61–1.12)

## 2023-08-20 LAB — SJOGRENS SYNDROME-B EXTRACTABLE NUCLEAR ANTIBODY: SSB (La) (ENA) Antibody, IgG: <0.2 AI (ref 0.0–0.9)

## 2023-08-20 LAB — SODIUM, URINE, RANDOM: Sodium, Ur: 126 mmol/L

## 2023-08-20 MED ORDER — MAGNESIUM SULFATE 2 GM/50ML IV SOLN
2.0000 g | Freq: Once | INTRAVENOUS | Status: AC
  Administered 2023-08-20: 2 g via INTRAVENOUS
  Filled 2023-08-20: qty 50

## 2023-08-20 MED ORDER — PILOCARPINE HCL 5 MG PO TABS
5.0000 mg | ORAL_TABLET | Freq: Two times a day (BID) | ORAL | Status: DC
  Administered 2023-08-20 – 2023-08-25 (×10): 5 mg via ORAL
  Filled 2023-08-20 (×10): qty 1

## 2023-08-20 NOTE — Progress Notes (Signed)
Mobility Specialist - Progress Note   08/20/23 1552  Mobility  Activity Ambulated with assistance in hallway  Level of Assistance Modified independent, requires aide device or extra time  Assistive Device Other (Comment) (IV Pole)  Distance Ambulated (ft) 1250 ft  Activity Response Tolerated well  Mobility Referral Yes  $Mobility charge 1 Mobility  Mobility Specialist Start Time (ACUTE ONLY) [id number]  Mobility Specialist Stop Time (ACUTE ONLY) 0353  Mobility Specialist Time Calculation (min) (ACUTE ONLY) 21 min   Pt received in bed and agreeable to mobility. No complaints during session. Pt to bed after session with all needs met.    [facility]

## 2023-08-20 NOTE — Plan of Care (Signed)
  Problem: Nutrition: Goal: Adequate nutrition will be maintained Outcome: Progressing   Problem: Skin Integrity: Goal: Risk for impaired skin integrity will decrease Outcome: Progressing   

## 2023-08-20 NOTE — Plan of Care (Signed)

## 2023-08-20 NOTE — Evaluation (Signed)
Clinical/Bedside Swallow Evaluation Patient Details  Name: Sean Skinner MRN: [id number] Date of Birth: 07-25-1941  Today's Date: 08/20/2023 Time: SLP Start Time (ACUTE ONLY): 1042 SLP Stop Time (ACUTE ONLY): 1057 SLP Time Calculation (min) (ACUTE ONLY): 15 min  Past Medical History:  Past Medical History:  Diagnosis Date   Basal cell cancer    face,arms,neck skin   Cystoid macular degeneration of left eye    HZV (herpes zoster virus) post herpetic neuralgia    Past Surgical History:  Past Surgical History:  Procedure Laterality Date   COLONOSCOPY WITH PROPOFOL N/A 03/13/2013   Procedure: COLONOSCOPY WITH PROPOFOL;  Surgeon: Charolett Bumpers, MD;  Location: [facility];  Service: Endoscopy;  Laterality: N/A;   IRRIGATION AND DEBRIDEMENT OF WOUND WITH SPLIT THICKNESS SKIN GRAFT Right 12/04/2022   Procedure: Operative debridement and preparation of 5X5 cm scalp wound, Integra placement;  Surgeon: Santiago Glad, MD;  Location: [facility];  Service: Plastics;  Laterality: Right;   Removal of skin cancer     HPI:  Sean Skinner is a 82 y.o. 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 Pt presents to ED with 2 week h/o dry mouth, emesis, fatigue, BLE edema. Per chart patient says that for the past 2 weeks he has not had any saliva production and his mouth has been extremely dry, drinking plenty of fluids, but has been unable to tolerate solid food and when he tries to eat, he feels nauseous and throws up the food immediately. Last week, he had a right molar tooth extracted because a crown fell off and the tooth was noted to be rotting underneath it.    Assessment / Plan / Recommendation  Clinical Impression  Pt reports 3 week history of xerostomia that is "severe". He states it takes him a significant amount of time to  chew making him feel nauseated.He has been eating mostly soft foods soups, puddings etc.Mechanics of oral and pharyngeal phase are within normal limits. His prolonged mastication was due to inefficient saliva and was cued to take sip liquid to moisten food (states he has not been doing this). There were no concerns with aspiration across consistencies. Spoke with Dr Lowell Guitar following assessment who suspects xerostomia may be from late effects of chemo in April for skin cancer. Pt has been avoiding most regular textures. He was encouraged to order softer but more subtantial foods than soups/puddings such as soft meat loaf and use liquid wash to assist in transit. Recommend continue regular textures, putting liquid in oral cavity to moisten food, thin liquids, Suggested Biotene which he states he has used with temporary relief. Consideration of meds to help reduce dry mouth from MD. No further ST needed at this time. SLP Visit Diagnosis: Dysphagia, unspecified (R13.10)    Aspiration Risk  No limitations    Diet Recommendation Regular;Thin liquid    Liquid Administration via: Cup;Straw Medication Administration: Whole meds with liquid Supervision: Patient able to self feed Postural Changes: Seated upright at 90 degrees    Other  Recommendations Oral Care Recommendations: Oral care BID    Recommendations for follow up therapy are one component of a multi-disciplinary discharge planning process, led by the attending physician.  Recommendations may be updated based on patient status, additional functional criteria and insurance authorization.  Follow up Recommendations No SLP follow up  Assistance Recommended at Discharge    Functional Status Assessment Patient has not had a recent decline in their functional status  Frequency and Duration            Prognosis        Swallow Study   General Date of Onset: 08/16/23 HPI: Sean Skinner is a 82 y.o. male with medical history significant  of SCC of the scalp s/p surgical resection and radiation (5 years ago), locally recurrent disease s/p 5 cycles of cemiplimab with last being at end of April. Pt presents to ED with 2 week h/o dry mouth, emesis, fatigue, BLE edema. Per chart patient says that for the past 2 weeks he has not had any saliva production and his mouth has been extremely dry, drinking plenty of fluids, but has been unable to tolerate solid food and when he tries to eat, he feels nauseous and throws up the food immediately. Last week, he had a right molar tooth extracted because a crown fell off and the tooth was noted to be rotting underneath it. Type of Study: Bedside Swallow Evaluation Previous Swallow Assessment:  (none) Diet Prior to this Study: Regular;Thin liquids (Level 0) (eating mostly soups, puddings) Temperature Spikes Noted: No Respiratory Status: Room air History of Recent Intubation: No Behavior/Cognition: Alert;Cooperative;Pleasant mood Oral Cavity Assessment: Dry Oral Care Completed by SLP: No Oral Cavity - Dentition: Adequate natural dentition (lower partial not in place and pt did not want to place) Vision: Functional for self-feeding Self-Feeding Abilities: Able to feed self Patient Positioning: Upright in bed Baseline Vocal Quality: Normal Volitional Cough: Strong Volitional Swallow: Able to elicit    Oral/Motor/Sensory Function Overall Oral Motor/Sensory Function: Within functional limits   Ice Chips Ice chips: Not tested   Thin Liquid Thin Liquid: Within functional limits    Nectar Thick Nectar Thick Liquid: Not tested   Honey Thick Honey Thick Liquid: Not tested   Puree Puree: Within functional limits   Solid     Solid: Impaired Oral Phase Functional Implications: Prolonged oral transit Pharyngeal Phase Impairments:  (none)      Tommaso Cavitt, Breck Coons 08/20/2023,11:23 AM

## 2023-08-20 NOTE — Progress Notes (Addendum)
PROGRESS NOTE    Sean Skinner  RUE:[id number] DOB: [birth year] DOA: 08/16/2023 PCP: Sean Aspen, MD  Chief Complaint  Patient presents with   Dry Mouth   Emesis   Nausea    Brief Narrative:    Sean Skinner is Sean Skinner 82 y.o. 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   Pt presents to ED with 2 week h/o dry mouth, emesis, fatigue, BLE edema.   Patient says that for the past 2 weeks he has not had any saliva production and his mouth has been extremely dry. He has been drinking plenty of fluids, but has been unable to tolerate solid food. When he tries to eat, he feels nauseous and throws up the food immediately. No blood in the emesis. He has also felt cold and lethargic. Last week, he had Sean Skinner right molar tooth extracted because Sean Skinner crown fell off and the tooth was noted to be rotting underneath it. He denies any pain or discharge in the area of the extraction. He also notes new swelling in his ankles. No changes in bowel movements or urination. No fevers, chills, night sweats at home.   Assessment & Plan:   Principal Problem:   AKI (acute kidney injury) (HCC) Active Problems:   Pulmonary nodules   Squamous cell carcinoma of scalp   Nausea and vomiting   Murmur   Failure to thrive in adult  AKI  Hyponatremia Baseline creatinine from outside records appears to be around 1.13 (03/2023).  Presumed secondary to dehydration in the setting of dry mouth discourages patient from taking PO as often as he should.  Gradually improving today Will continue IVF (transition to normal saline) and continue to trend - ? Relationship to delayed immune related adverse event or dress syndrome? UA bland CT without hydro Daughter requested renal consult, I think we can continue supportive care for now with IVF, but will consult renal if  needed/appropriate    Xerostomia Seems like this was rather sudden Sean Skinner few weeks ago.  Related to cemiplimab -> possible per discussion with her oncologist, possible delayed immune related adverse event?  Related to history of radiation (unlikely per discussion with Sean Skinner).  Will call his oncologist to discuss.  May also reach out to Sean Skinner if he's available.  Biotene Additional workup as indicated -> will follow sjogren's labs (negative SSA, negative SSB), TSH (elevated - follow free T4 normal) Symptomatic management of dry mouth  Continue IV fluids until better tolerating oral intake I've asked our oncologist here to comment as well  Eosinophilia Could this be dress like picture (time course doesn't fit)?  Of note, dress is listed as known adverse effect, though time course is [name] little long for dress as last cycle of cemiplimab was at the end of April.  Has mild erythema to lower extremities, otherwise no notable rash.  Does have abnormal LFT's and kidney function.  Could consider steroids, will hold for now with mild and improving LFT's as well as stable renal function. Follow smear eosinophilia, b12 wnl As above, discussing with heme/onc here  Nausea/vomiting In part related to patient's dry mouth, it seems. No current nausea. CT without acute abnormality -Continue Zofran PRN  Pulmonary nodules Bilateral. Noted on imaging. Pulmonology consulted and recommend repeat CT in 3-6 months unless patient's oncologist determines otherwise.   Hyponatremia -Continue  IV fluids   Elevated AST/ALT Hyperbilirubinemia RUQ significant for steatosis. Otherwise unsure etiology. Asymptomatic. Possibly related to overall dehydration/hemodynamically mediated. -CMP in AM   Murmur Concern for possible endocarditis on admission -> blood cultures negative to date x3 days, echo without evidence of valvular vegetations.  Will continue to monitor.     DVT prophylaxis: lovenox Code Status:  full Family Communication: none Disposition:   Status is: Inpatient Remains inpatient appropriate because: need for continued inpatient care   Consultants:  none  Procedures:  Echo IMPRESSIONS     1. Left ventricular ejection fraction, by estimation, is 60 to 65%. The  left ventricle has normal function. The left ventricle has no regional  wall motion abnormalities. There is mild concentric left ventricular  hypertrophy. Left ventricular diastolic  parameters were normal.   2. Right ventricular systolic function is normal. The right ventricular  size is mildly enlarged. There is normal pulmonary artery systolic  pressure. The estimated right ventricular systolic pressure is 25.8 mmHg.   3. The mitral valve is normal in structure. No evidence of mitral valve  regurgitation. No evidence of mitral stenosis.   4. The aortic valve is tricuspid. Aortic valve regurgitation is not  visualized. Aortic valve sclerosis/calcification is present, without any  evidence of aortic stenosis.   5. The inferior vena cava is dilated in size with >50% respiratory  variability, suggesting right atrial pressure of 8 mmHg.   Conclusion(s)/Recommendation(s): No evidence of valvular vegetations on  this transthoracic echocardiogram. Consider Sean Skinner transesophageal  echocardiogram to exclude infective endocarditis if clinically indicated.   Antimicrobials:  Anti-infectives (From admission, onward)    None       Subjective: No new complaints - dry mouth unchanged  Objective: Vitals:   08/19/23 0624 08/19/23 1327 08/20/23 0524 08/20/23 1222  BP: 123/72 121/73 114/72 113/77  Pulse: 86 89 85 86  Resp: 17 16 16 17   Temp: 98.1 F (36.7 C) 98.1 F (36.7 C) 98.4 F (36.9 C) 98.1 F (36.7 C)  TempSrc: Oral   Oral  SpO2: 97% 96% 94% 97%  Weight:      Height:        Intake/Output Summary (Last 24 hours) at 08/20/2023 1301 Last data filed at 08/20/2023 0300 Gross per 24 hour  Intake 1000 ml   Output --  Net 1000 ml   Filed Weights   08/16/23 1213  Weight: 85.7 kg    Examination:  General: No acute distress. Cardiovascular: RRR Lungs: unlabored Abdomen: Soft, nontender, nondistended  Neurological: Alert and oriented 3. Moves all extremities 4 with equal strength. Cranial nerves II through XII grossly intact. Extremities: trace edema    Data Reviewed: I have personally reviewed following labs and imaging studies  CBC: Recent Labs  Lab 08/17/23 0527 08/18/23 0554 08/19/23 0525 08/19/23 1822 08/20/23 0807  WBC 7.6 7.4 6.9 6.7 6.7  NEUTROABS  --  3.1 2.7 2.9 2.5  HGB 10.5* 10.7* 10.0* 10.5* 9.8*  HCT 31.3* 32.0* 29.2* 31.0* 28.6*  MCV 98.7 98.5 98.0 98.4 96.6  PLT 257 258 240 226 229    Basic Metabolic Panel: Recent Labs  Lab 08/17/23 0527 08/18/23 0554 08/19/23 0525 08/19/23 1822 08/20/23 0807  NA 130* 130* 127* 131* 131*  K 4.5 4.4 4.0 4.0 3.9  CL 102 101 99 105 106  CO2 19* 20* 19* 20* 19*  GLUCOSE 93 95 94 114* 90  BUN 24* 16 15 13 13   CREATININE 1.68* 1.54* 1.51* 1.46* 1.48*  CALCIUM 8.2* 8.6*  7.9* 7.6* 7.4*  MG  --   --  1.7  --  1.6*  PHOS  --   --  4.0  --  3.8    GFR: Estimated Creatinine Clearance: 44 mL/min (Yasin Ducat) (by C-G formula based on SCr of 1.48 mg/dL (H)).  Liver Function Tests: Recent Labs  Lab 08/16/23 1223 08/17/23 0527 08/18/23 0554 08/19/23 0525 08/20/23 0807  AST 92* 89* 91* 79* 71*  ALT 65* 62* 62* 57* 48*  ALKPHOS 137* 134* 130* 122 115  BILITOT 1.3* 1.4* 1.3* 1.5* 1.3*  PROT 6.6 5.8* 5.7* 5.3* 5.1*  ALBUMIN 3.5 2.9* 2.8* 2.7* 2.6*    CBG: No results for input(s): "GLUCAP" in the last 168 hours.   Recent Results (from the past 240 hour(s))  SARS Coronavirus 2 by RT PCR (hospital order, performed in [facility] hospital lab) *cepheid single result test* Anterior Nasal Swab     Status: None   Collection Time: 08/16/23  3:17 PM   Specimen: Anterior Nasal Swab  Result Value Ref Range Status   SARS  Coronavirus 2 by RT PCR NEGATIVE NEGATIVE Final    Comment: (NOTE) SARS-CoV-2 target nucleic acids are NOT DETECTED.  The SARS-CoV-2 RNA is generally detectable in upper and lower respiratory specimens during the acute phase of infection. The lowest concentration of SARS-CoV-2 viral copies this assay can detect is 250 copies / mL. Keymora Grillot negative result does not preclude SARS-CoV-2 infection and should not be used as the sole basis for treatment or other patient management decisions.  Macai Sisneros negative result may occur with improper specimen collection / handling, submission of specimen other than nasopharyngeal swab, presence of viral mutation(s) within the areas targeted by this assay, and inadequate number of viral copies (<250 copies / mL). Crew Goren negative result must be combined with clinical observations, patient history, and epidemiological information.  Fact Sheet for Patients:   RoadLapTop.co.za  Fact Sheet for Healthcare Providers: http://kim-miller.com/  This test is not yet approved or  cleared by the [place] FDA and has been authorized for detection and/or diagnosis of SARS-CoV-2 by FDA under an Emergency Use Authorization (EUA).  This EUA will remain in effect (meaning this test can be used) for the duration of the COVID-19 declaration under Section 564(b)(1) of the Act, 21 U.S.C. section 360bbb-3(b)(1), unless the authorization is terminated or revoked sooner.  Performed at [facility], [address], [place], [place] [address]   Culture, blood (Routine X 2) w Reflex to ID Panel     Status: None (Preliminary result)   Collection Time: 08/17/23  5:27 AM   Specimen: BLOOD  Result Value Ref Range Status   Specimen Description   Final    BLOOD BLOOD RIGHT ARM Performed at [facility], [address] [place], [place] [address]    Special Requests   Final    BOTTLES DRAWN AEROBIC AND  ANAEROBIC Blood Culture adequate volume Performed at [facility], [address], [place], [place] [address]    Culture   Final    NO GROWTH 3 DAYS Performed at [facility], [address] [place], [place] [address]    Report Status PENDING  Incomplete  Culture, blood (Routine X 2) w Reflex to ID Panel     Status: None (Preliminary result)   Collection Time: 08/17/23  5:27 AM   Specimen: BLOOD  Result Value Ref Range Status   Specimen Description   Final    BLOOD BLOOD RIGHT  ARM Performed at [facility], [address] [place], [place] [address]    Special Requests   Final    BOTTLES DRAWN AEROBIC AND ANAEROBIC Blood Culture adequate volume Performed at [facility], [address] [place], [place] [address]    Culture   Final    NO GROWTH 3 DAYS Performed at [facility], [address], [place], [place] [address]    Report Status PENDING  Incomplete         Radiology Studies: No results found.      Scheduled Meds:  enoxaparin (LOVENOX) injection  40 mg Subcutaneous Q24H   Continuous Infusions:  sodium chloride 125 mL/hr at 08/19/23 1846   magnesium sulfate bolus IVPB 2 g (08/20/23 1204)     LOS: 2 days    Time spent: over 30 min    Lacretia Nicks, MD [facility]   To contact the attending provider between 7A-7P or the covering provider during after hours 7P-7A, please log into the web site [email or web address] and access using universal [place] password for that web site. If you do not have the password, please call the hospital operator.  08/20/2023, 1:01 PM

## 2023-08-21 DIAGNOSIS — N179 Acute kidney failure, unspecified: Secondary | ICD-10-CM | POA: Diagnosis not present

## 2023-08-21 LAB — COMPREHENSIVE METABOLIC PANEL
ALT: 46 U/L — ABNORMAL HIGH (ref 0–44)
AST: 68 U/L — ABNORMAL HIGH (ref 15–41)
Albumin: 2.4 g/dL — ABNORMAL LOW (ref 3.5–5.0)
Alkaline Phosphatase: 110 U/L (ref 38–126)
Anion gap: 5 (ref 5–15)
BUN: 10 mg/dL (ref 8–23)
CO2: 18 mmol/L — ABNORMAL LOW (ref 22–32)
Calcium: 7.2 mg/dL — ABNORMAL LOW (ref 8.9–10.3)
Chloride: 107 mmol/L (ref 98–111)
Creatinine, Ser: 1.24 mg/dL (ref 0.61–1.24)
GFR, Estimated: 58 mL/min — ABNORMAL LOW (ref >60)
Glucose, Bld: 87 mg/dL (ref 70–99)
Potassium: 3.6 mmol/L (ref 3.5–5.1)
Sodium: 130 mmol/L — ABNORMAL LOW (ref 135–145)
Total Bilirubin: 1.1 mg/dL (ref 0.3–1.2)
Total Protein: 4.9 g/dL — ABNORMAL LOW (ref 6.5–8.1)

## 2023-08-21 LAB — CBC WITH DIFFERENTIAL/PLATELET
Abs Immature Granulocytes: 0.04 10*3/uL (ref 0.00–0.07)
Basophils Absolute: 0.1 10*3/uL (ref 0.0–0.1)
Basophils Relative: 1 %
Eosinophils Absolute: 1.4 10*3/uL — ABNORMAL HIGH (ref 0.0–0.5)
Eosinophils Relative: 23 %
HCT: 26.5 % — ABNORMAL LOW (ref 39.0–52.0)
Hemoglobin: 8.9 g/dL — ABNORMAL LOW (ref 13.0–17.0)
Immature Granulocytes: 1 %
Lymphocytes Relative: 18 %
Lymphs Abs: 1.1 10*3/uL (ref 0.7–4.0)
MCH: 33.2 pg (ref 26.0–34.0)
MCHC: 33.6 g/dL (ref 30.0–36.0)
MCV: 98.9 fL (ref 80.0–100.0)
Monocytes Absolute: 1.2 10*3/uL — ABNORMAL HIGH (ref 0.1–1.0)
Monocytes Relative: 19 %
Neutro Abs: 2.4 10*3/uL (ref 1.7–7.7)
Neutrophils Relative %: 38 %
Platelets: 210 10*3/uL (ref 150–400)
RBC: 2.68 MIL/uL — ABNORMAL LOW (ref 4.22–5.81)
RDW: 13 % (ref 11.5–15.5)
WBC: 6.2 10*3/uL (ref 4.0–10.5)
nRBC: 0 % (ref 0.0–0.2)

## 2023-08-21 LAB — C-REACTIVE PROTEIN: CRP: 1.5 mg/dL — ABNORMAL HIGH (ref <1.0)

## 2023-08-21 LAB — PHOSPHORUS: Phosphorus: 3.1 mg/dL (ref 2.5–4.6)

## 2023-08-21 LAB — MAGNESIUM: Magnesium: 1.6 mg/dL — ABNORMAL LOW (ref 1.7–2.4)

## 2023-08-21 LAB — SEDIMENTATION RATE: Sed Rate: 25 mm/h — ABNORMAL HIGH (ref 0–16)

## 2023-08-21 MED ORDER — ENSURE ENLIVE PO LIQD
237.0000 mL | Freq: Three times a day (TID) | ORAL | Status: DC
  Administered 2023-08-21 – 2023-08-22 (×3): 237 mL via ORAL

## 2023-08-21 MED ORDER — MAGNESIUM SULFATE 2 GM/50ML IV SOLN
2.0000 g | Freq: Once | INTRAVENOUS | Status: AC
  Administered 2023-08-21: 2 g via INTRAVENOUS
  Filled 2023-08-21: qty 50

## 2023-08-21 NOTE — Progress Notes (Addendum)
Initial Nutrition Assessment  DOCUMENTATION CODES:   Not applicable  INTERVENTION:  Liberalize diet to regular to encourage better PO with more dining options Ensure Enlive po TID, each supplement provides 350 kcal and 20 grams of protein Magic cup TID with meals, each supplement provides 290 kcal and 9 grams of protein Recommend placement of small bore tube to provide nutrition and hydration until pt's dry mouth is managed and intake increases. Goal TF regimen is as follows: Osmolite 1.5 @ 16mL/h (1233mL/d) Start at 20 and advance by 10mL q8h to monitor for tolerance and shifts in electrolytes Prosource TF 1x/d of free water q4h This will provide 1880, 95g of protein, and free water Pt will be at risk for refeeding if enteral nutrition is initiated. Recommend thiamine x 5 days and monitoring Mg, phosphorus, and K q12h x 72 hours. MD to replace as needed for low levels  NUTRITION DIAGNOSIS:   Inadequate oral intake related to  (dry mouth) as evidenced by per patient/family report.  GOAL:   Patient will meet greater than or equal to 90% of their needs  MONITOR:   PO intake, Supplement acceptance, Labs, Weight trends  REASON FOR ASSESSMENT:   Consult Assessment of nutrition requirement/status  ASSESSMENT:   Pt with hx of SCC of the scalp with recent recurrence (completion of 5 cycles of cemiplimab in 03/2023) presented to ED with 2 weeks of extreme dry mouth, emesis and fatigue.  RD working remotely  Attempted to call patient on room phone, no answer at this time. Discussed with RN, family is out getting pt food and will return later. Will attempt to call again if able this afternoon.   Discussed intake with RN. States that pt's intake is minimal and he is barely drinking. IVF in place for hydration. Noted that Nephrology consulted this AM but feels after hydration, kidney function is at baseline. Did note edema and that pt was third spacing. Recommended a  reduction in IVF. Oncology also evaluated pt this AM, does not feel strongly that this is DRESS syndrome and if so, it is an atypical presentation. Did recommend ENT evaluation.   Did liberalize diet and added ensure supplements. However, interventions are note expected to be effective as pt is not eating or drinking due to dry mouth and this issue is unresolved.   Expressed my concerns to attending and offered that NGT placement would likely be of great benefit to patient in the interim to ensure that nutrition needs were met and to give reliable access for medications and free water. Pt's intake has been poor x 2 weeks and likely has malnutrition present. Will confirm with physical exam on follow-up.   Pt will be at risk for refeeding if enteral nutrition is initiated. Recommend thiamine x 5 days and monitoring Mg, phosphorus, and K q12h x 72 hours and MD to replace as needed  Addendum: Able to speak with pt's daughter Clydie Braun on the phone. States that pt ate a bowel of cereal, a little more than 1.2 a banana for lunch, and orange juice and this was much better than his previous meals (although still inadequate to meet needs). Reiterated that pt states it is hard to eat due to extreme dry mouth and altered taste. Pt is attempting to eat things that are soft and/or liquid but avoiding other foods. Wife just returned from getting a frosty and soup from [employer]. At baseline, pt is very active and walks >6 miles a day. Daughter also reports he  still has wounds present SCC than are healing further increasing nutrition needs.   Did reiterate my concerns with prolonged inadequate oral intake being a driver for malnutrition and deconditioning particularly with pt's advanced age. Did discuss a temporary feeding tube in order to bridge the gap in oral intake and supplement what pt was taking in. They will discuss as a family and let MD/RN what their thoughts were. Encouraged to try ensure and magic this afternoon  and see if they were tolerable to patient.     Average Meal Intake: 8/20-8/23: 75% intake x 5 recorded meals  Nutritionally Relevant Medications: Scheduled Meds:  pilocarpine  5 mg Oral BID   Continuous Infusions:  sodium chloride 125 mL/hr at 08/21/23 0730   magnesium sulfate bolus IVPB 2 g (08/21/23 1215)   PRN Meds: ondansetron  Labs Reviewed: Na 130 Mg 1.6  NUTRITION - FOCUSED PHYSICAL EXAM: Defer to in-person assessment  Diet Order:   Diet Order             Diet regular Room service appropriate? Yes with Assist; Fluid consistency: Thin  Diet effective now                   EDUCATION NEEDS:   Not appropriate for education at this time  Skin:  Skin Assessment: Reviewed RN Assessment  Last BM:  8/23  Height:   Ht Readings from Last 1 Encounters:  08/16/23 6\' 2"  (1.88 m)    Weight:   Wt Readings from Last 1 Encounters:  08/16/23 85.7 kg    Ideal Body Weight:  86.4 kg  BMI:  Body mass index is 24.27 kg/m.  Estimated Nutritional Needs:   Kcal:  1800-2000 kcal/d  Protein:  90-105 g/d  Fluid:  >/=1.8L/d    Greig Castilla, RD, LDN Clinical Dietitian RD pager # available in [facility]  After hours/weekend pager # available in [facility]

## 2023-08-21 NOTE — Consult Note (Signed)
Renal Service Consult Note [facility]  Sean Skinner 08/21/2023 Sean Krabbe, MD Requesting Physician: Dr. Lowell Guitar, C.   Reason for Consult: Renal failure HPI: The patient is a 82 y.o. year-old w/ PMH as below who presented to ED 8/19 c/o N/V and dry mouth for about 2 wks. Vomiting w/ solid foods for last 1 wk. Is very cold, can't stay warm. Hx SCC on top of head and new onset LE swelling. Earlier this spring, pt was rx'd with cemiplimab x 5 cycles completed in April for hx of locally recurrent SCC disease. In ED BUN 27, creat 1.64, Ca 8.9   Hb 11. Prior creat was 1.13 on 4/30 at [facility].  US showed hepatic steatosis, CXR was negative. CT neck soft tissue was negative. CT chest / abd w/o contrast showed no acute changes, multiple bilateral pulm nodules. Pt was admitted for dx of AKI, dehydration and acute illness. We are asked to see for renal failure.   Pt seen in room. He has no c/o's. Some ankle swelling. No hx renal disease, no nsaids at home. Main c/o is dry mouth and inability to get solid food down.   ROS - denies CP, no joint pain, no HA, no blurry vision, no rash, no diarrhea, no nausea/ vomiting, no dysuria, no difficulty voiding   Past Medical History  Past Medical History:  Diagnosis Date   Basal cell cancer    face,arms,neck skin   Cystoid macular degeneration of left eye    HZV (herpes zoster virus) post herpetic neuralgia    Past Surgical History  Past Surgical History:  Procedure Laterality Date   COLONOSCOPY WITH PROPOFOL N/A 03/13/2013   Procedure: COLONOSCOPY WITH PROPOFOL;  Surgeon: Charolett Bumpers, MD;  Location: [facility];  Service: Endoscopy;  Laterality: N/A;   IRRIGATION AND DEBRIDEMENT OF WOUND WITH SPLIT THICKNESS SKIN GRAFT Right 12/04/2022   Procedure: Operative debridement and preparation of 5X5 cm scalp wound, Integra placement;  Surgeon: Santiago Glad, MD;  Location: [facility];  Service: Plastics;  Laterality:  Right;   Removal of skin cancer     Family History History reviewed. No pertinent family history. Social History  reports that he has never smoked. He has quit using smokeless tobacco. He reports current alcohol use. He reports that he does not use drugs. Allergies No Known Allergies Home medications Prior to Admission medications   Medication Sig Start Date End Date Taking? Authorizing Provider  cyanocobalamin (VITAMIN B12) 1000 MCG tablet Take 1,000 mcg by mouth daily.   Yes [provider]  pantoprazole (PROTONIX) 40 MG tablet Take 40 mg by mouth daily.   Yes [provider]  valACYclovir (VALTREX) 1000 MG tablet  09/25/19  Yes [provider]     Vitals:   08/20/23 0524 08/20/23 1222 08/20/23 1944 08/21/23 0459  BP: 114/72 113/77 120/66 119/63  Pulse: 85 86 86 86  Resp: 16 17 18 17   Temp: 98.4 F (36.9 C) 98.1 F (36.7 C) 98.6 F (37 C) 99.2 F (37.3 C)  TempSrc:  Oral Oral   SpO2: 94% 97% 97% 91%  Weight:      Height:       Exam Gen alert, no distress No rash, cyanosis or gangrene Sclera anicteric, throat clear  No jvd or bruits Chest clear bilat to bases, no rales/ wheezing RRR no RG Abd soft ntnd no mass or ascites +bs GU normal male MS no joint effusions or deformity Ext pitting 1-2+  ankle edema, no other edema Neuro is alert, Ox 3 , nf     Home meds include - B12, protonix, valtrex      Date   Creat  eGFR    Feb 2024  1.23  59    Mar 2024  1.19  61     April 2024     1.13- 1.18 61- 64 ml/min    8/19   1.64  41 ml/min    8/20   1.68    8/21   1.54    8/22   1.46    8/23   1.48    08/21/23  1.24  58 ml/min        UA 08/18/23 - negative     8/23 --> UNa 126, UCr 80    CT abd / pelvis --> Urinary Tract: Contrast from previous CT neck within the ureters and bladder. No hydronephrosis. Unremarkable adrenal glands.    BP's 113- 121/ 64- 75 since admit    HR 80s RR 17 afebrile    No UOP recorded, only wt is from 8/19 at  85.7kg    Labs Na 130  K 3.6  CO2 18  glu 87  BUN 10  creat 1.24   Ca 7.2  alb 2.4     AST 68  ALT 46   Tbili 1.1 total prot 4.9    eGFR 58 ml/min       WBC 6K  eos absolute 1.4 (0- 0.5 K/uL) Hb 8.9         IVF's since admission --> 1.5 L minimum         Assessment/ Plan: AKI - b/l creat 1.1- 1.2 from April 2024, eGFR 61-64 ml/min. Creat here was 1.64 on admission on 8/19.  He has gotten 2-3 L IVF"s since admission and creatinine has corrected to 1.24 which is close to his baseline. He does have low alb and is 3rd spacing some of the IVF's we are giving him. UA is negative. CT a/p showed no obstruction. Very unlikely there is any renal disease at this time. Creat has corrected w/ IVF"s while here suggesting simple volume depletion. Lack of protein, RBC's or WBCs in urine argue against this being a GN or an AIN related to his other issues Aleen Sells, possible DRESS). Would cont to give IVF's at 50- 75 cc/hr for another 1-2 days, thereafter if he is drinking they could be stopped. No other suggestions. Will sign off.  Xerostomia - undergoing w/u per pmd, oncology consulting Nausea/ vomiting - looks euvolemic today (has been here several days getting IVF's).     [name]  MD [facility] 08/21/2023, 10:08 AM  Recent Labs  Lab 08/20/23 0807 08/21/23 0808  HGB 9.8* 8.9*  ALBUMIN 2.6* 2.4*  CALCIUM 7.4* 7.2*  PHOS 3.8 3.1  CREATININE 1.48* 1.24  K 3.9 3.6   Inpatient medications:  enoxaparin (LOVENOX) injection  40 mg Subcutaneous Q24H   pilocarpine  5 mg Oral BID    sodium chloride 125 mL/hr at 08/20/23 2355   acetaminophen **OR** acetaminophen, antiseptic oral rinse, ondansetron **OR** ondansetron (ZOFRAN) IV

## 2023-08-21 NOTE — Plan of Care (Signed)

## 2023-08-21 NOTE — Plan of Care (Signed)

## 2023-08-21 NOTE — Consult Note (Signed)
Mr. Singleton is a very interesting man.  I thoroughly enjoyed talking to him.  He is an 82 year old white male.  He served in the [employer].  I thanked him for service.  He has a history of squamous cell carcinoma of the skin.  He has had radiation therapy in the past for this.  This was probably about 7 years ago.  Most recently, he had surgery for a scalp wound.  He then underwent adjuvant immunotherapy with Cemiplimab.  He had 5 cycles.  This was done at [facility].  This was completed in April.  The problem now is that he has incredibly dry mouth.  Says he not able to swallow.  He is not able to eat because he cannot chew his food.  This is been going on for about 3 weeks.  2 months ago, he was doing well.  He has had no issues with diarrhea.  He has had no fever.  He has had no cough.  He has had no nausea or vomiting.  He did have a CT scan of the body on 08/16/2023.  Everything looked okay although there were some pulmonary nodules.  These were small.  They were nondistinct.  When he came in, his sodium was 130.  Potassium 4.9.  BUN 27 creatinine 1.64.  Calcium 8.9 with an albumin of 3.5.  SGPT was 65 SGOT 92.  Bilirubin 1.3.  The CBC shows a white cell count of 8.7.  Hemoglobin 11.2.  Platelet count 283,000.  Of note, there is been some eosinophilia.  Today there is no last week.  However, yesterday his eosinophil percentage was 23%.  I think the issue is whether or not this is DRESS syndrome.  I suppose this may be some kind of variant of that.  DRESS typically  does not occur this far out from therapy.  I am not heard of immunotherapy causing DRESS.  I suppose this could happen.  It is hard to say if there is any kind of rash.  He was started on Salagen.  I would think that he may need to be looked at by ENT.  I am sure that there are some chest x-ray help determine the salivary gland function.  He has had no nausea or vomiting.  Again, he really is not eating because he  cannot swallow.  There is no diarrhea.   His vital signs are temperature 99.2.  Pulse 86.  Blood pressure 119/63.  Head neck exam shows dry oral mucosa.  I do not see any mucositis.  There is no adenopathy in the neck.  Ocular exam is unremarkable.  Pupils react appropriately.  Lungs are clear.  Cardiac exam regular rate and rhythm with no murmurs.  Abdomen soft.  Bowel sounds are present.  There is no fluid wave.  Extremity shows no clubbing, cyanosis or edema.  Skin exam shows a lot of skin damage from past sun exposure.  Neurological exam is nonfocal.   Again, it is hard to say if this is a variant of DRESS.  He does have the eosinophilia.  He does have some organ dysfunction by virtue of the mildly elevated LFTs and renal function.  I am not sure if the salivary glands might be part of this.  He is on Salagen now.  We will just have to watch to see if the does improve.  Of note, he does have a high TSH.  It was 9.3.  I wonder if needs to  be on Synthroid.  I will have to speak with his oncologist down in [facility].  I know her well.  I will have to see if she is ever seen this before with immunotherapy.  He has a lot of fun to talk to.  He is incredibly interesting.  He has had a very active life.  We will follow along and try to help out any way that we can.   Christin Bach, MD  Fayrene Fearing 1:5

## 2023-08-21 NOTE — Progress Notes (Signed)
PROGRESS NOTE    [name]  HQI:[id number] DOB: Sep 12, 1941 DOA: 08/16/2023 PCP: Emilio Aspen, MD  Chief Complaint  Patient presents with   Dry Mouth   Emesis   Nausea    Brief Narrative:    Sean Skinner is Sean Skinner 82 y.o. 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   Pt presents to ED with 2 week h/o dry mouth, emesis, fatigue, BLE edema.   Patient says that for the past 2 weeks he has not had any saliva production and his mouth has been extremely dry. He has been drinking plenty of fluids, but has been unable to tolerate solid food. When he tries to eat, he feels nauseous and throws up the food immediately. No blood in the emesis. He has also felt cold and lethargic. Last week, he had [name] right molar tooth extracted because Sean Skinner crown fell off and the tooth was noted to be rotting underneath it. He denies any pain or discharge in the area of the extraction. He also notes new swelling in his ankles. No changes in bowel movements or urination. No fevers, chills, night sweats at home.   Assessment & Plan:   Principal Problem:   AKI (acute kidney injury) (HCC) Active Problems:   Pulmonary nodules   Squamous cell carcinoma of scalp   Nausea and vomiting   Murmur   Failure to thrive in adult  AKI  Hyponatremia Baseline creatinine from outside records appears to be around 1.13 (03/2023).  Presumed secondary to dehydration in the setting of dry mouth discourages patient from taking PO as often as he should.  Gradually improving today Will continue IVF (transition to normal saline) and continue to trend - ? Relationship to delayed immune related adverse event or dress syndrome? UA bland CT without hydro Appreciate renal assistance   Xerostomia Seems like this was rather sudden Sean Skinner few weeks ago.  Related to cemiplimab ->  possible per discussion with her oncologist, possible delayed immune related adverse event?  Related to history of radiation (unlikely per discussion with Dr. [name]).  Will call his oncologist to discuss.  May also reach out to Dr. [name] if he's available.  Biotene Additional workup as indicated -> will follow sjogren's labs (negative SSA, negative SSB), TSH (elevated - free T4 normal) Symptomatic management of dry mouth  Pilocarpine Appreciate ENT recs Continue IV fluids until better tolerating oral intake I've asked our oncologist here to comment as well  Eosinophilia Could this be dress like picture?  Of note, dress is listed as known adverse effect for cemiplimab, though time course is Sean Skinner little long for dress as last cycle of cemiplimab was at the end of April.  Has mild erythema to lower extremities, otherwise no notable rash.  Does have abnormal LFT's and kidney function.  Could consider steroids, will hold for now with mild and improving LFT's as well as stable renal function. Follow smear eosinophilia, b12 wnl As above, discussing with heme/onc here I think question for this and above is whether to consider trial of steroids  Nausea/vomiting In part related to patient's dry mouth, it seems. No current nausea. CT without acute abnormality -Continue Zofran PRN  Pulmonary nodules Bilateral. Noted on imaging. Pulmonology consulted and recommend repeat CT in 3-6 months unless patient's oncologist determines otherwise.   Hyponatremia -Continue IV fluids  Elevated AST/ALT Hyperbilirubinemia RUQ significant for steatosis. Otherwise unsure etiology. Asymptomatic. Possibly related to overall dehydration/hemodynamically mediated. -CMP in AM   Murmur Concern for possible endocarditis on admission -> blood cultures negative to date x3 days, echo without evidence of valvular vegetations.  Will continue to monitor.     DVT prophylaxis: lovenox Code Status: full Family Communication:  none Disposition:   Status is: Inpatient Remains inpatient appropriate because: need for continued inpatient care   Consultants:  none  Procedures:  Echo IMPRESSIONS     1. Left ventricular ejection fraction, by estimation, is 60 to 65%. The  left ventricle has normal function. The left ventricle has no regional  wall motion abnormalities. There is mild concentric left ventricular  hypertrophy. Left ventricular diastolic  parameters were normal.   2. Right ventricular systolic function is normal. The right ventricular  size is mildly enlarged. There is normal pulmonary artery systolic  pressure. The estimated right ventricular systolic pressure is 25.8 mmHg.   3. The mitral valve is normal in structure. No evidence of mitral valve  regurgitation. No evidence of mitral stenosis.   4. The aortic valve is tricuspid. Aortic valve regurgitation is not  visualized. Aortic valve sclerosis/calcification is present, without any  evidence of aortic stenosis.   5. The inferior vena cava is dilated in size with >50% respiratory  variability, suggesting right atrial pressure of 8 mmHg.   Conclusion(s)/Recommendation(s): No evidence of valvular vegetations on  this transthoracic echocardiogram. Consider Jasdeep Kepner transesophageal  echocardiogram to exclude infective endocarditis if clinically indicated.   Antimicrobials:  Anti-infectives (From admission, onward)    None       Subjective: No new complaints Maybe some mild improvement with pilocarpine  Objective: Vitals:   08/20/23 1222 08/20/23 1944 08/21/23 0459 08/21/23 1300  BP: 113/77 120/66 119/63 121/71  Pulse: 86 86 86 82  Resp: 17 18 17 18   Temp: 98.1 F (36.7 C) 98.6 F (37 C) 99.2 F (37.3 C) 98.2 F (36.8 C)  TempSrc: Oral Oral  Oral  SpO2: 97% 97% 91% 98%  Weight:      Height:       No intake or output data in the 24 hours ending 08/21/23 1554  Filed Weights   08/16/23 1213  Weight: 85.7 kg     Examination:  General: No acute distress. Cardiovascular: RRR Lungs: unlabored Abdomen: Soft, nontender, nondistended  Neurological: Alert and oriented 3. Moves all extremities 4 with equal strength. Cranial nerves II through XII grossly intact. Extremities: trace edema     Data Reviewed: I have personally reviewed following labs and imaging studies  CBC: Recent Labs  Lab 08/18/23 0554 08/19/23 0525 08/19/23 1822 08/20/23 0807 08/21/23 0808  WBC 7.4 6.9 6.7 6.7 6.2  NEUTROABS 3.1 2.7 2.9 2.5 2.4  HGB 10.7* 10.0* 10.5* 9.8* 8.9*  HCT 32.0* 29.2* 31.0* 28.6* 26.5*  MCV 98.5 98.0 98.4 96.6 98.9  PLT 258 240 226 229 210    Basic Metabolic Panel: Recent Labs  Lab 08/18/23 0554 08/19/23 0525 08/19/23 1822 08/20/23 0807 08/21/23 0808  NA 130* 127* 131* 131* 130*  K 4.4 4.0 4.0 3.9 3.6  CL 101 99 105 106 107  CO2 20* 19* 20* 19* 18*  GLUCOSE 95 94 114* 90 87  BUN 16 15 13 13 10   CREATININE 1.54* 1.51* 1.46* 1.48* 1.24  CALCIUM 8.6* 7.9* 7.6* 7.4* 7.2*  MG  --  1.7  --  1.6* 1.6*  PHOS  --  4.0  --  3.8 3.1    GFR: Estimated Creatinine Clearance: 52.5 mL/min (by C-G formula based on SCr of 1.24 mg/dL).  Liver Function Tests: Recent Labs  Lab 08/17/23 0527 08/18/23 0554 08/19/23 0525 08/20/23 0807 08/21/23 0808  AST 89* 91* 79* 71* 68*  ALT 62* 62* 57* 48* 46*  ALKPHOS 134* 130* 122 115 110  BILITOT 1.4* 1.3* 1.5* 1.3* 1.1  PROT 5.8* 5.7* 5.3* 5.1* 4.9*  ALBUMIN 2.9* 2.8* 2.7* 2.6* 2.4*    CBG: No results for input(s): "GLUCAP" in the last 168 hours.   Recent Results (from the past 240 hour(s))  SARS Coronavirus 2 by RT PCR (hospital order, performed in [facility] hospital lab) *cepheid single result test* Anterior Nasal Swab     Status: None   Collection Time: 08/16/23  3:17 PM   Specimen: Anterior Nasal Swab  Result Value Ref Range Status   SARS Coronavirus 2 by RT PCR NEGATIVE NEGATIVE Final    Comment: (NOTE) SARS-CoV-2 target nucleic  acids are NOT DETECTED.  The SARS-CoV-2 RNA is generally detectable in upper and lower respiratory specimens during the acute phase of infection. The lowest concentration of SARS-CoV-2 viral copies this assay can detect is 250 copies / mL. Kayley Zeiders negative result does not preclude SARS-CoV-2 infection and should not be used as the sole basis for treatment or other patient management decisions.  Karla Vines negative result may occur with improper specimen collection / handling, submission of specimen other than nasopharyngeal swab, presence of viral mutation(s) within the areas targeted by this assay, and inadequate number of viral copies (<250 copies / mL). Terilynn Buresh negative result must be combined with clinical observations, patient history, and epidemiological information.  Fact Sheet for Patients:   RoadLapTop.co.za  Fact Sheet for Healthcare Providers: http://kim-miller.com/  This test is not yet approved or  cleared by the [place] FDA and has been authorized for detection and/or diagnosis of SARS-CoV-2 by FDA under an Emergency Use Authorization (EUA).  This EUA will remain in effect (meaning this test can be used) for the duration of the COVID-19 declaration under Section 564(b)(1) of the Act, 21 U.S.C. section 360bbb-3(b)(1), unless the authorization is terminated or revoked sooner.  Performed at [facility], [address], [place], [place] [address]   Culture, blood (Routine X 2) w Reflex to ID Panel     Status: None (Preliminary result)   Collection Time: 08/17/23  5:27 AM   Specimen: BLOOD  Result Value Ref Range Status   Specimen Description   Final    BLOOD BLOOD RIGHT ARM Performed at [facility], [address] [place], [place] [address]    Special Requests   Final    BOTTLES DRAWN AEROBIC AND ANAEROBIC Blood Culture adequate volume Performed at [facility], [address] [place], [place] [address]    Culture   Final    NO GROWTH 4 DAYS Performed at [facility], [address] [place], [place] [address]    Report Status PENDING  Incomplete  Culture, blood (Routine X 2) w Reflex to ID Panel     Status: None (Preliminary result)   Collection Time: 08/17/23  5:27 AM   Specimen: BLOOD  Result Value Ref Range Status   Specimen Description   Final    BLOOD BLOOD RIGHT ARM Performed at [facility], [address] [place], [place] [address]    Special Requests   Final    BOTTLES DRAWN AEROBIC AND  ANAEROBIC Blood Culture adequate volume Performed at [facility], [address] [place], [place] [address]    Culture   Final    NO GROWTH 4 DAYS Performed at [facility], [address], [place], [place] [address]    Report Status PENDING  Incomplete         Radiology Studies: No results found.      Scheduled Meds:  enoxaparin (LOVENOX) injection  40 mg Subcutaneous Q24H   feeding supplement  237 mL Oral TID BM   pilocarpine  5 mg Oral BID   Continuous Infusions:  sodium chloride 125 mL/hr at 08/21/23 0730     LOS: 3 days    Time spent: over 30 min    Lacretia Nicks, MD [facility]   To contact the attending provider between 7A-7P or the covering provider during after hours 7P-7A, please log into the web site [email or web address] and access using universal [place] password for that web site. If you do not have the password, please call the hospital operator.  08/21/2023, 3:54 PM

## 2023-08-22 DIAGNOSIS — N179 Acute kidney failure, unspecified: Secondary | ICD-10-CM | POA: Diagnosis not present

## 2023-08-22 LAB — CBC WITH DIFFERENTIAL/PLATELET
Abs Immature Granulocytes: 0.05 10*3/uL (ref 0.00–0.07)
Basophils Absolute: 0.1 10*3/uL (ref 0.0–0.1)
Basophils Relative: 1 %
Eosinophils Absolute: 1.9 10*3/uL — ABNORMAL HIGH (ref 0.0–0.5)
Eosinophils Relative: 26 %
HCT: 29.8 % — ABNORMAL LOW (ref 39.0–52.0)
Hemoglobin: 10.1 g/dL — ABNORMAL LOW (ref 13.0–17.0)
Immature Granulocytes: 1 %
Lymphocytes Relative: 18 %
Lymphs Abs: 1.3 10*3/uL (ref 0.7–4.0)
MCH: 33.1 pg (ref 26.0–34.0)
MCHC: 33.9 g/dL (ref 30.0–36.0)
MCV: 97.7 fL (ref 80.0–100.0)
Monocytes Absolute: 1.2 10*3/uL — ABNORMAL HIGH (ref 0.1–1.0)
Monocytes Relative: 16 %
Neutro Abs: 2.9 10*3/uL (ref 1.7–7.7)
Neutrophils Relative %: 38 %
Platelets: 240 10*3/uL (ref 150–400)
RBC: 3.05 MIL/uL — ABNORMAL LOW (ref 4.22–5.81)
RDW: 13 % (ref 11.5–15.5)
WBC: 7.5 10*3/uL (ref 4.0–10.5)
nRBC: 0 % (ref 0.0–0.2)

## 2023-08-22 LAB — COMPREHENSIVE METABOLIC PANEL
ALT: 44 U/L (ref 0–44)
AST: 67 U/L — ABNORMAL HIGH (ref 15–41)
Albumin: 2.6 g/dL — ABNORMAL LOW (ref 3.5–5.0)
Alkaline Phosphatase: 116 U/L (ref 38–126)
Anion gap: 7 (ref 5–15)
BUN: 13 mg/dL (ref 8–23)
CO2: 18 mmol/L — ABNORMAL LOW (ref 22–32)
Calcium: 7.8 mg/dL — ABNORMAL LOW (ref 8.9–10.3)
Chloride: 105 mmol/L (ref 98–111)
Creatinine, Ser: 1.35 mg/dL — ABNORMAL HIGH (ref 0.61–1.24)
GFR, Estimated: 52 mL/min — ABNORMAL LOW (ref >60)
Glucose, Bld: 93 mg/dL (ref 70–99)
Potassium: 4.1 mmol/L (ref 3.5–5.1)
Sodium: 130 mmol/L — ABNORMAL LOW (ref 135–145)
Total Bilirubin: 0.9 mg/dL (ref 0.3–1.2)
Total Protein: 5.2 g/dL — ABNORMAL LOW (ref 6.5–8.1)

## 2023-08-22 LAB — CULTURE, BLOOD (ROUTINE X 2)
Culture: NO GROWTH
Culture: NO GROWTH
Special Requests: ADEQUATE
Special Requests: ADEQUATE

## 2023-08-22 LAB — PHOSPHORUS: Phosphorus: 3.2 mg/dL (ref 2.5–4.6)

## 2023-08-22 LAB — MAGNESIUM: Magnesium: 1.7 mg/dL (ref 1.7–2.4)

## 2023-08-22 MED ORDER — METHYLPREDNISOLONE SODIUM SUCC 40 MG IJ SOLR
40.0000 mg | Freq: Two times a day (BID) | INTRAMUSCULAR | Status: DC
  Administered 2023-08-22 – 2023-08-23 (×2): 40 mg via INTRAVENOUS
  Filled 2023-08-22 (×2): qty 1

## 2023-08-22 MED ORDER — VALACYCLOVIR HCL 500 MG PO TABS: 1000.0000 mg | ORAL_TABLET | Freq: Every day | ORAL | Status: DC

## 2023-08-22 MED ORDER — VALACYCLOVIR HCL 500 MG PO TABS
1000.0000 mg | ORAL_TABLET | Freq: Every day | ORAL | Status: DC
  Administered 2023-08-22 – 2023-08-25 (×4): 1000 mg via ORAL
  Filled 2023-08-22 (×4): qty 2

## 2023-08-22 MED ORDER — CARMEX CLASSIC LIP BALM EX OINT: TOPICAL_OINTMENT | CUTANEOUS | Status: DC | PRN

## 2023-08-22 MED ORDER — PREDNISONE 20 MG PO TABS: 40.0000 mg | ORAL_TABLET | Freq: Every day | ORAL | Status: DC

## 2023-08-22 MED ORDER — METHYLPREDNISOLONE SODIUM SUCC 40 MG IJ SOLR: 40.0000 mg | Freq: Once | INTRAMUSCULAR | Status: DC

## 2023-08-22 NOTE — Plan of Care (Signed)

## 2023-08-22 NOTE — Progress Notes (Addendum)
PROGRESS NOTE    [name]  ZOX:[id number] DOB: July 29, 1941 DOA: 08/16/2023 PCP: Emilio Aspen, MD  Chief Complaint  Patient presents with   Dry Mouth   Emesis   Nausea    Brief Narrative:    Sean Skinner is Sean Skinner 82 y.o. 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   Pt presents to ED with 2 week h/o dry mouth, emesis, fatigue, BLE edema.   Patient says that for the past 2 weeks he has not had any saliva production and his mouth has been extremely dry. He has been drinking plenty of fluids, but has been unable to tolerate solid food. When he tries to eat, he feels nauseous and throws up the food immediately. No blood in the emesis. He has also felt cold and lethargic. Last week, he had [name] right molar tooth extracted because [name] crown fell off and the tooth was noted to be rotting underneath it. He denies any pain or discharge in the area of the extraction. He also notes new swelling in his ankles. No changes in bowel movements or urination. No fevers, chills, night sweats at home.   Assessment & Plan:   Principal Problem:   AKI (acute kidney injury) (HCC) Active Problems:   Pulmonary nodules   Squamous cell carcinoma of scalp   Nausea and vomiting   Murmur   Failure to thrive in adult  AKI  Hyponatremia Baseline creatinine from outside records appears to be around 1.13 (03/2023).  Presumed secondary to dehydration in the setting of dry mouth discourages patient from taking PO as often as he should.  Gradually improving today Will continue IVF (transition to normal saline) and continue to trend - ? Relationship to delayed immune related adverse event or dress syndrome? UA bland CT without hydro Appreciate renal assistance   Xerostomia  Eosinophilia  Delayed Immune Related Adverse Event  Dress Syndrome Seems like  this was rather sudden Ranell Finelli few weeks ago.  Related to cemiplimab -> possible per discussion with her oncologist, possible delayed immune related adverse event?  Based on my discussion with Dr. [name], unlikely related to history of radiation.  Given eosinophilia and rash (noted to back) in addition to abnormal LFT's and aki, suspect this represents DRESS syndrome (which is one of the severe cutaneous adverse reactions which can be an immune related cutaneous adverse event).  Xerostomia is also [name] known adverse effect, but described associated with the musculoskeletal toxicities (which he doesn't seem to have).  Of note, valtrex associated with DRESS syndrome, but doubt this is culprit as he's been on this continuously over past 4 years.    Rash noted to back (didn't recognize prior to today, he has chronic skin changes related to sun damaged skin which made recognition difficult) Meets criteria for steroids with rash of 10-30% body surface area - not sure whether steroids will help with xerostomia - will need taper over next month or so Biotene, pilocarpine (will increase dose as tolerated) Additional workup as indicated -> will follow sjogren's labs (negative SSA, negative SSB), TSH (elevated - free T4 normal) Blood cultures negative, acute hepatitis panel pending, mycoplasma pending, ana Smear with eosinophilia, b12 wnl  Appreciate ENT recs (8/25) Continue IV fluids until better tolerating oral intake I've asked our oncologist here to comment as well  Management of Immune-Related Adverse Events  in Patients Treated With Immune Checkpoint Inhibitor Therapy: ASCO Guideline Update  Journal of Clinical Oncology (ascopubs.org)   Nausea/vomiting In part related to patient's dry mouth, it seems. No current nausea. CT without acute abnormality -Continue Zofran PRN  Zoster Notably, has been on valtrex for about 4 years (of note, this is associated with dress, but doubt this is cause given his being on this  chronically over 4 years)  Continue valtrex   Pulmonary nodules Bilateral. Noted on imaging. Pulmonology consulted and recommend repeat CT in 3-6 months unless patient's oncologist determines otherwise.   Hyponatremia -Continue IV fluids   Elevated AST/ALT Hyperbilirubinemia RUQ significant for steatosis. Otherwise unsure etiology. Asymptomatic. Possibly related to overall dehydration/hemodynamically mediated. -CMP in AM   Murmur Concern for possible endocarditis on admission -> blood cultures negative to date x3 days, echo without evidence of valvular vegetations.  Will continue to monitor.     DVT prophylaxis: lovenox Code Status: full Family Communication: none Disposition:   Status is: Inpatient Remains inpatient appropriate because: need for continued inpatient care   Consultants:  none  Procedures:  Echo IMPRESSIONS     1. Left ventricular ejection fraction, by estimation, is 60 to 65%. The  left ventricle has normal function. The left ventricle has no regional  wall motion abnormalities. There is mild concentric left ventricular  hypertrophy. Left ventricular diastolic  parameters were normal.   2. Right ventricular systolic function is normal. The right ventricular  size is mildly enlarged. There is normal pulmonary artery systolic  pressure. The estimated right ventricular systolic pressure is 25.8 mmHg.   3. The mitral valve is normal in structure. No evidence of mitral valve  regurgitation. No evidence of mitral stenosis.   4. The aortic valve is tricuspid. Aortic valve regurgitation is not  visualized. Aortic valve sclerosis/calcification is present, without any  evidence of aortic stenosis.   5. The inferior vena cava is dilated in size with >50% respiratory  variability, suggesting right atrial pressure of 8 mmHg.   Conclusion(s)/Recommendation(s): No evidence of valvular vegetations on  this transthoracic echocardiogram. Consider [name] transesophageal   echocardiogram to exclude infective endocarditis if clinically indicated.   Antimicrobials:  Anti-infectives (From admission, onward)    Start     Dose/Rate Route Frequency Ordered Stop   08/22/23 1115  valACYclovir (VALTREX) tablet 1,000 mg  Status:  Discontinued        1,000 mg Oral Daily 08/22/23 1024 08/22/23 1025       Subjective: Things are about the same  Objective: Vitals:   08/21/23 0459 08/21/23 1300 08/21/23 2200 08/22/23 0628  BP: 119/63 121/71 124/75 119/71  Pulse: 86 82 85 85  Resp: 17 18 18 18   Temp: 99.2 F (37.3 C) 98.2 F (36.8 C) 98.4 F (36.9 C) 98.2 F (36.8 C)  TempSrc:  Oral Oral Oral  SpO2: 91% 98% 97% 97%  Weight:      Height:        Intake/Output Summary (Last 24 hours) at 08/22/2023 1312 Last data filed at 08/22/2023 1042 Gross per 24 hour  Intake 240 ml  Output --  Net 240 ml    Filed Weights   08/16/23 1213  Weight: 85.7 kg    Examination:  General: No acute distress. Cardiovascular: RRR Lungs: unlabored Abdomen: Soft, nontender, nondistended Neurological: Alert and oriented 3. Moves all extremities 4 with equal strength. Cranial nerves II through XII grossly intact. Skin: somewhat difficult to appreciate, but he does have maculopapular rash to his back  superimposed on chronic skin changes (chronic sun damaged skin)  Extremities: No clubbing or cyanosis. No edema.     Data Reviewed: I have personally reviewed following labs and imaging studies  CBC: Recent Labs  Lab 08/18/23 0554 08/19/23 0525 08/19/23 1822 08/20/23 0807 08/21/23 0808  WBC 7.4 6.9 6.7 6.7 6.2  NEUTROABS 3.1 2.7 2.9 2.5 2.4  HGB 10.7* 10.0* 10.5* 9.8* 8.9*  HCT 32.0* 29.2* 31.0* 28.6* 26.5*  MCV 98.5 98.0 98.4 96.6 98.9  PLT 258 240 226 229 210    Basic Metabolic Panel: Recent Labs  Lab 08/18/23 0554 08/19/23 0525 08/19/23 1822 08/20/23 0807 08/21/23 0808  NA 130* 127* 131* 131* 130*  K 4.4 4.0 4.0 3.9 3.6  CL 101 99 105 106 107  CO2  20* 19* 20* 19* 18*  GLUCOSE 95 94 114* 90 87  BUN 16 15 13 13 10   CREATININE 1.54* 1.51* 1.46* 1.48* 1.24  CALCIUM 8.6* 7.9* 7.6* 7.4* 7.2*  MG  --  1.7  --  1.6* 1.6*  PHOS  --  4.0  --  3.8 3.1    GFR: Estimated Creatinine Clearance: 52.5 mL/min (by C-G formula based on SCr of 1.24 mg/dL).  Liver Function Tests: Recent Labs  Lab 08/17/23 0527 08/18/23 0554 08/19/23 0525 08/20/23 0807 08/21/23 0808  AST 89* 91* 79* 71* 68*  ALT 62* 62* 57* 48* 46*  ALKPHOS 134* 130* 122 115 110  BILITOT 1.4* 1.3* 1.5* 1.3* 1.1  PROT 5.8* 5.7* 5.3* 5.1* 4.9*  ALBUMIN 2.9* 2.8* 2.7* 2.6* 2.4*    CBG: No results for input(s): "GLUCAP" in the last 168 hours.   Recent Results (from the past 240 hour(s))  SARS Coronavirus 2 by RT PCR (hospital order, performed in [facility] hospital lab) *cepheid single result test* Anterior Nasal Swab     Status: None   Collection Time: 08/16/23  3:17 PM   Specimen: Anterior Nasal Swab  Result Value Ref Range Status   SARS Coronavirus 2 by RT PCR NEGATIVE NEGATIVE Final    Comment: (NOTE) SARS-CoV-2 target nucleic acids are NOT DETECTED.  The SARS-CoV-2 RNA is generally detectable in upper and lower respiratory specimens during the acute phase of infection. The lowest concentration of SARS-CoV-2 viral copies this assay can detect is 250 copies / mL. Kagan Hietpas negative result does not preclude SARS-CoV-2 infection and should not be used as the sole basis for treatment or other patient management decisions.  Yianna Tersigni negative result may occur with improper specimen collection / handling, submission of specimen other than nasopharyngeal swab, presence of viral mutation(s) within the areas targeted by this assay, and inadequate number of viral copies (<250 copies / mL). Lothar Prehn negative result must be combined with clinical observations, patient history, and epidemiological information.  Fact Sheet for Patients:   RoadLapTop.co.za  Fact Sheet for  Healthcare Providers: http://kim-miller.com/  This test is not yet approved or  cleared by the [place] FDA and has been authorized for detection and/or diagnosis of SARS-CoV-2 by FDA under an Emergency Use Authorization (EUA).  This EUA will remain in effect (meaning this test can be used) for the duration of the COVID-19 declaration under Section 564(b)(1) of the Act, 21 U.S.C. section 360bbb-3(b)(1), unless the authorization is terminated or revoked sooner.  Performed at [facility], [address] [place], [place] [address]   Culture, blood (Routine X 2) w Reflex to ID Panel     Status: None   Collection Time: 08/17/23  5:27 AM  Specimen: BLOOD  Result Value Ref Range Status   Specimen Description   Final    BLOOD BLOOD RIGHT ARM Performed at [facility], [address], [place], [place] [address]    Special Requests   Final    BOTTLES DRAWN AEROBIC AND ANAEROBIC Blood Culture adequate volume Performed at [facility], [address] [place], [place] [address]    Culture   Final    NO GROWTH 5 DAYS Performed at [facility], [address], [place], [place] [address]    Report Status 08/22/2023 FINAL  Final  Culture, blood (Routine X 2) w Reflex to ID Panel     Status: None   Collection Time: 08/17/23  5:27 AM   Specimen: BLOOD  Result Value Ref Range Status   Specimen Description   Final    BLOOD BLOOD RIGHT ARM Performed at [facility], [address], [place], [place] [address]    Special Requests   Final    BOTTLES DRAWN AEROBIC AND ANAEROBIC Blood Culture adequate volume Performed at [facility], [address], [place], [place] [address]    Culture   Final    NO GROWTH 5 DAYS Performed at [facility], [address] [place], [place] [address]    Report Status 08/22/2023 FINAL  Final         Radiology Studies: No  results found.      Scheduled Meds:  enoxaparin (LOVENOX) injection  40 mg Subcutaneous Q24H   feeding supplement  237 mL Oral TID BM   pilocarpine  5 mg Oral BID   Continuous Infusions:  sodium chloride 125 mL/hr at 08/22/23 0819     LOS: 4 days    Time spent: over 30 min    Lacretia Nicks, MD [facility]   To contact the attending provider between 7A-7P or the covering provider during after hours 7P-7A, please log into the web site [email or web address] and access using universal [place] password for that web site. If you do not have the password, please call the hospital operator.  08/22/2023, 1:12 PM

## 2023-08-22 NOTE — Plan of Care (Signed)
  Problem: Education: Goal: Knowledge of General Education information will improve Description: Including pain rating scale, medication(s)/side effects and non-pharmacologic comfort measures Outcome: Progressing   Problem: Health Behavior/Discharge Planning: Goal: Ability to manage health-related needs will improve Outcome: Progressing   Problem: Clinical Measurements: Goal: Ability to maintain clinical measurements within normal limits will improve Outcome: Progressing Goal: Will remain free from infection Outcome: Progressing Goal: Diagnostic test results will improve Outcome: Progressing   Problem: Nutrition: Goal: Adequate nutrition will be maintained Outcome: Progressing   Problem: Coping: Goal: Level of anxiety will decrease Outcome: Progressing   Problem: Elimination: Goal: Will not experience complications related to bowel motility Outcome: Progressing Goal: Will not experience complications related to urinary retention Outcome: Progressing   Problem: Pain Managment: Goal: General experience of comfort will improve Outcome: Progressing   Problem: Safety: Goal: Ability to remain free from injury will improve Outcome: Progressing   

## 2023-08-22 NOTE — Consult Note (Signed)
Reason for Consult: Xerostomia Referring Physician: Dr. Maryella Shivers Sean Skinner is an 82 y.o. male.  HPI: History of squamous cell carcinoma of the scalp and has had surgery as well as immunotherapy at [facility].  Apparently [facility] doctor has been contacted about this is xerostomia.  The patient is extremely frustrated with this problem and he cannot eat.  He has no pain in the mouth.  He has tried various moisturizing options without significant relief.  Past Medical History:  Diagnosis Date   Basal cell cancer    face,arms,neck skin   Cystoid macular degeneration of left eye    HZV (herpes zoster virus) post herpetic neuralgia     Past Surgical History:  Procedure Laterality Date   COLONOSCOPY WITH PROPOFOL N/A 03/13/2013   Procedure: COLONOSCOPY WITH PROPOFOL;  Surgeon: Charolett Bumpers, MD;  Location: [facility];  Service: Endoscopy;  Laterality: N/A;   IRRIGATION AND DEBRIDEMENT OF WOUND WITH SPLIT THICKNESS SKIN GRAFT Right 12/04/2022   Procedure: Operative debridement and preparation of 5X5 cm scalp wound, Integra placement;  Surgeon: Santiago Glad, MD;  Location: [facility];  Service: Plastics;  Laterality: Right;   Removal of skin cancer      History reviewed. No pertinent family history.  Social History:  reports that he has never smoked. He has quit using smokeless tobacco. He reports current alcohol use. He reports that he does not use drugs.  Allergies: No Known Allergies  Medications: I have reviewed the patient's current medications.  Results for orders placed or performed during the hospital encounter of 08/16/23 (from the past 48 hour(s))  Creatinine, urine, random     Status: None   Collection Time: 08/20/23  4:02 PM  Result Value Ref Range   Creatinine, Urine 80 mg/dL    Comment: Performed at [facility], [address], [place], [place] [address]  Sodium, urine, random     Status: None   Collection Time: 08/20/23   4:02 PM  Result Value Ref Range   Sodium, Ur 126 mmol/L    Comment: Performed at [facility], [address] [place], [place] [address]  CBC with Differential/Platelet     Status: Abnormal   Collection Time: 08/21/23  8:08 AM  Result Value Ref Range   WBC 6.2 4.0 - 10.5 K/uL   RBC 2.68 (L) 4.22 - 5.81 MIL/uL   Hemoglobin 8.9 (L) 13.0 - 17.0 g/dL   HCT 10.2 (L) 72.5 - 36.6 %   MCV 98.9 80.0 - 100.0 fL   MCH 33.2 26.0 - 34.0 pg   MCHC 33.6 30.0 - 36.0 g/dL   RDW 44.0 34.7 - 42.5 %   Platelets 210 150 - 400 K/uL   nRBC 0.0 0.0 - 0.2 %   Neutrophils Relative % 38 %   Neutro Abs 2.4 1.7 - 7.7 K/uL   Lymphocytes Relative 18 %   Lymphs Abs 1.1 0.7 - 4.0 K/uL   Monocytes Relative 19 %   Monocytes Absolute 1.2 (H) 0.1 - 1.0 K/uL   Eosinophils Relative 23 %   Eosinophils Absolute 1.4 (H) 0.0 - 0.5 K/uL   Basophils Relative 1 %   Basophils Absolute 0.1 0.0 - 0.1 K/uL   Immature Granulocytes 1 %   Abs Immature Granulocytes 0.04 0.00 - 0.07 K/uL    Comment: Performed at [facility], [address] [place], [place] [address]  Comprehensive metabolic panel     Status: Abnormal  Collection Time: 08/21/23  8:08 AM  Result Value Ref Range   Sodium 130 (L) 135 - 145 mmol/L   Potassium 3.6 3.5 - 5.1 mmol/L   Chloride 107 98 - 111 mmol/L   CO2 18 (L) 22 - 32 mmol/L   Glucose, Bld 87 70 - 99 mg/dL    Comment: Glucose reference range applies only to samples taken after fasting for at least 8 hours.   BUN 10 8 - 23 mg/dL   Creatinine, Ser 4.09 0.61 - 1.24 mg/dL   Calcium 7.2 (L) 8.9 - 10.3 mg/dL   Total Protein 4.9 (L) 6.5 - 8.1 g/dL   Albumin 2.4 (L) 3.5 - 5.0 g/dL   AST 68 (H) 15 - 41 U/L   ALT 46 (H) 0 - 44 U/L   Alkaline Phosphatase 110 38 - 126 U/L   Total Bilirubin 1.1 0.3 - 1.2 mg/dL   GFR, Estimated 58 (L) >60 mL/min    Comment: (NOTE) Calculated using the CKD-EPI Creatinine Equation (2021)    Anion gap 5 5 - 15    Comment: Performed at  [facility], [address], [place], [place] [address]  Magnesium     Status: Abnormal   Collection Time: 08/21/23  8:08 AM  Result Value Ref Range   Magnesium 1.6 (L) 1.7 - 2.4 mg/dL    Comment: Performed at [facility], [address] [place], [place] [address]  Phosphorus     Status: None   Collection Time: 08/21/23  8:08 AM  Result Value Ref Range   Phosphorus 3.1 2.5 - 4.6 mg/dL    Comment: Performed at [facility], [address] [place], [place] [address]  C-reactive protein     Status: Abnormal   Collection Time: 08/21/23  8:08 AM  Result Value Ref Range   CRP 1.5 (H) <1.0 mg/dL    Comment: Performed at [facility], [address], [place], [place] [address]  Sedimentation rate     Status: Abnormal   Collection Time: 08/21/23  8:08 AM  Result Value Ref Range   Sed Rate 25 (H) 0 - 16 mm/hr    Comment: Performed at [facility], [address], [place], [place] [address]    No results found.  ROS Blood pressure 119/71, pulse 85, temperature 98.2 F (36.8 C), temperature source Oral, resp. rate 18, height 6\' 2"  (1.88 m), weight 85.7 kg, SpO2 97%. Physical Exam HENT:     Head: Normocephalic.     Right Ear: External ear normal.     Left Ear: External ear normal.     Nose: Nose normal.     Mouth/Throat:     Comments: Very dry mouth with no obvious ulcerations or lesions Eyes:     Pupils: Pupils are equal, round, and reactive to light.  Musculoskeletal:     Cervical back: Normal range of motion.  Neurological:     Mental Status: He is alert.       Assessment/Plan: Sean Skinner is almost certainly secondary to his immunotherapy as that is one of the side effects that was presented to him.  He did not have it prior to the treatment.  Other causes are certainly a possibility.  Regarding treatment obviously pilocarpine is one of the attempts but sometimes side effects are a problem  as well as ineffectiveness.  There is the option of a pilocarpine rinse that can be compounded.  There is also salivary gland stimulators  and water irrigation systems that are known but I have never used those.  I would suggest trying some oil with a teaspoon just prior to eating and see if that helps him with being able to eat.  I otherwise do not have any unique options for this situation.  Suzanna Obey 08/22/2023, 10:27 AM

## 2023-08-22 NOTE — Progress Notes (Signed)
Patient complained of nausea, Zofran 4 mg IV administered. Approximately 5 minutes after administration pt experienced an episode of emesis.

## 2023-08-23 DIAGNOSIS — N179 Acute kidney failure, unspecified: Secondary | ICD-10-CM | POA: Diagnosis not present

## 2023-08-23 LAB — CBC WITH DIFFERENTIAL/PLATELET
Abs Immature Granulocytes: 0.05 10*3/uL (ref 0.00–0.07)
Basophils Absolute: 0 10*3/uL (ref 0.0–0.1)
Basophils Relative: 1 %
Eosinophils Absolute: 0 10*3/uL (ref 0.0–0.5)
Eosinophils Relative: 0 %
HCT: 28.4 % — ABNORMAL LOW (ref 39.0–52.0)
Hemoglobin: 9.4 g/dL — ABNORMAL LOW (ref 13.0–17.0)
Immature Granulocytes: 1 %
Lymphocytes Relative: 14 %
Lymphs Abs: 0.6 10*3/uL — ABNORMAL LOW (ref 0.7–4.0)
MCH: 32.9 pg (ref 26.0–34.0)
MCHC: 33.1 g/dL (ref 30.0–36.0)
MCV: 99.3 fL (ref 80.0–100.0)
Monocytes Absolute: 0.2 10*3/uL (ref 0.1–1.0)
Monocytes Relative: 3 %
Neutro Abs: 3.6 10*3/uL (ref 1.7–7.7)
Neutrophils Relative %: 81 %
Platelets: 233 10*3/uL (ref 150–400)
RBC: 2.86 MIL/uL — ABNORMAL LOW (ref 4.22–5.81)
RDW: 12.7 % (ref 11.5–15.5)
WBC: 4.4 10*3/uL (ref 4.0–10.5)
nRBC: 0 % (ref 0.0–0.2)

## 2023-08-23 LAB — COMPREHENSIVE METABOLIC PANEL
ALT: 39 U/L (ref 0–44)
AST: 51 U/L — ABNORMAL HIGH (ref 15–41)
Albumin: 2.5 g/dL — ABNORMAL LOW (ref 3.5–5.0)
Alkaline Phosphatase: 107 U/L (ref 38–126)
Anion gap: 4 — ABNORMAL LOW (ref 5–15)
BUN: 16 mg/dL (ref 8–23)
CO2: 17 mmol/L — ABNORMAL LOW (ref 22–32)
Calcium: 7.4 mg/dL — ABNORMAL LOW (ref 8.9–10.3)
Chloride: 111 mmol/L (ref 98–111)
Creatinine, Ser: 1.25 mg/dL — ABNORMAL HIGH (ref 0.61–1.24)
GFR, Estimated: 57 mL/min — ABNORMAL LOW (ref >60)
Glucose, Bld: 135 mg/dL — ABNORMAL HIGH (ref 70–99)
Potassium: 4 mmol/L (ref 3.5–5.1)
Sodium: 132 mmol/L — ABNORMAL LOW (ref 135–145)
Total Bilirubin: 1 mg/dL (ref 0.3–1.2)
Total Protein: 5.2 g/dL — ABNORMAL LOW (ref 6.5–8.1)

## 2023-08-23 LAB — MAGNESIUM: Magnesium: 1.8 mg/dL (ref 1.7–2.4)

## 2023-08-23 LAB — HEPATITIS PANEL, ACUTE
HCV Ab: NONREACTIVE
Hep A IgM: NONREACTIVE
Hep B C IgM: NONREACTIVE
Hepatitis B Surface Ag: NONREACTIVE

## 2023-08-23 LAB — ANA W/REFLEX IF POSITIVE: Anti Nuclear Antibody (ANA): NEGATIVE

## 2023-08-23 LAB — PHOSPHORUS: Phosphorus: 3.3 mg/dL (ref 2.5–4.6)

## 2023-08-23 MED ORDER — PREDNISONE 10 MG PO TABS: 10.0000 mg | ORAL_TABLET | Freq: Every day | ORAL | Status: DC

## 2023-08-23 MED ORDER — PREDNISONE 20 MG PO TABS: 20.0000 mg | ORAL_TABLET | Freq: Every day | ORAL | Status: DC

## 2023-08-23 MED ORDER — PREDNISONE 20 MG PO TABS: 30.0000 mg | ORAL_TABLET | Freq: Every day | ORAL | Status: DC

## 2023-08-23 MED ORDER — METHYLPREDNISOLONE SODIUM SUCC 40 MG IJ SOLR
40.0000 mg | Freq: Two times a day (BID) | INTRAMUSCULAR | Status: AC
  Administered 2023-08-23 – 2023-08-24 (×3): 40 mg via INTRAVENOUS
  Filled 2023-08-23 (×3): qty 1

## 2023-08-23 MED ORDER — PREDNISONE 20 MG PO TABS
40.0000 mg | ORAL_TABLET | Freq: Every day | ORAL | Status: DC
  Administered 2023-08-25: 40 mg via ORAL
  Filled 2023-08-23: qty 2

## 2023-08-23 MED ORDER — PREDNISONE 20 MG PO TABS: 40.0000 mg | ORAL_TABLET | Freq: Every day | ORAL | Status: DC

## 2023-08-23 MED ORDER — PREDNISONE 5 MG PO TABS: 5.0000 mg | ORAL_TABLET | Freq: Every day | ORAL | Status: DC

## 2023-08-23 MED ORDER — METHYLPREDNISOLONE SODIUM SUCC 40 MG IJ SOLR: 40.0000 mg | Freq: Two times a day (BID) | INTRAMUSCULAR | Status: DC

## 2023-08-23 NOTE — Plan of Care (Signed)

## 2023-08-23 NOTE — Progress Notes (Signed)
PROGRESS NOTE    [name]  QQV:[id number] DOB: [birth year] DOA: 08/16/2023 PCP: Emilio Aspen, MD  Chief Complaint  Patient presents with   Dry Mouth   Emesis   Nausea    Brief Narrative:    Sean Skinner is Sean Skinner 82 y.o. 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   Pt presents to ED with 2 week h/o dry mouth, emesis, fatigue, BLE edema.   Patient says that for the past 2 weeks he has not had any saliva production and his mouth has been extremely dry. He has been drinking plenty of fluids, but has been unable to tolerate solid food. When he tries to eat, he feels nauseous and throws up the food immediately. No blood in the emesis. He has also felt cold and lethargic. Last week, he had Kajsa Butrum right molar tooth extracted because [name] crown fell off and the tooth was noted to be rotting underneath it. He denies any pain or discharge in the area of the extraction. He also notes new swelling in his ankles. No changes in bowel movements or urination. No fevers, chills, night sweats at home.   Assessment & Plan:   Principal Problem:   AKI (acute kidney injury) (HCC) Active Problems:   Pulmonary nodules   Squamous cell carcinoma of scalp   Nausea and vomiting   Murmur   Failure to thrive in adult  AKI  Hyponatremia Baseline creatinine from outside records appears to be around 1.13 (03/2023).  Presumed secondary to dehydration in the setting of dry mouth discourages patient from taking PO as often as he should.  Gradually improving today Will continue IVF (transition to normal saline) and continue to trend - ? Relationship to below UA bland CT without hydro Appreciate renal assistance   Xerostomia  Eosinophilia  Delayed Immune Related Adverse Event  Dress Syndrome Seems like this was rather sudden [name] few weeks ago.  Suspect  related to cemiplimab -> possible per discussion with her oncologist, possible delayed immune related adverse event?  Based on my discussion with Dr. [name], unlikely related to history of radiation.   Given eosinophilia and rash (noted to back) in addition to abnormal LFT's and aki, suspect this represents DRESS syndrome (which is one of the severe cutaneous adverse reactions which can be an immune related cutaneous adverse event).  Xerostomia is also [name] known adverse effect, but described associated with the musculoskeletal toxicities (which he doesn't seem to have).  Of note, valtrex associated with DRESS syndrome, but doubt this is culprit as he's been on this continuously over past 4 years.    Meets criteria for steroids with rash of 10-30% body surface area  Solumedrol, will need long prednisone taper over at least 4 weeks Biotene, pilocarpine (will increase dose as tolerated) Additional workup as indicated -> will follow sjogren's labs (negative SSA, negative SSB), TSH (elevated - free T4 normal) Blood cultures negative, acute hepatitis panel pending, mycoplasma pending, ana Smear with eosinophilia, b12 wnl  Eosinophilia has improved markedly after initiation of steroids Notes improvement in xerostomia as well Appreciate ENT recs (8/25) Continue IV fluids until better tolerating oral intake I've asked our oncologist here to comment as well  Management of Immune-Related Adverse Events in Patients Treated With Immune Checkpoint Inhibitor Therapy: ASCO Guideline Update  Journal of Clinical Oncology ([email or web address])   Nausea/vomiting  In part related to patient's dry mouth, it seems. No current nausea. CT without acute abnormality -Continue Zofran PRN  Zoster Notably, has been on valtrex for about 4 years (of note, this is associated with dress, but doubt this is cause given his being on this chronically over 4 years)  Continue valtrex   Pulmonary nodules Bilateral. Noted on imaging.  Pulmonology consulted and recommend repeat CT in 3-6 months unless patient's oncologist determines otherwise.   Hyponatremia -Continue IV fluids   Elevated AST/ALT Hyperbilirubinemia RUQ significant for steatosis. Otherwise unsure etiology. Asymptomatic. Possibly related to overall dehydration/hemodynamically mediated. -CMP in AM   Murmur Concern for possible endocarditis on admission -> blood cultures negative to date x3 days, echo without evidence of valvular vegetations.  Will continue to monitor.     DVT prophylaxis: lovenox Code Status: full Family Communication: none Disposition:   Status is: Inpatient Remains inpatient appropriate because: need for continued inpatient care   Consultants:  none  Procedures:  Echo IMPRESSIONS     1. Left ventricular ejection fraction, by estimation, is 60 to 65%. The  left ventricle has normal function. The left ventricle has no regional  wall motion abnormalities. There is mild concentric left ventricular  hypertrophy. Left ventricular diastolic  parameters were normal.   2. Right ventricular systolic function is normal. The right ventricular  size is mildly enlarged. There is normal pulmonary artery systolic  pressure. The estimated right ventricular systolic pressure is 25.8 mmHg.   3. The mitral valve is normal in structure. No evidence of mitral valve  regurgitation. No evidence of mitral stenosis.   4. The aortic valve is tricuspid. Aortic valve regurgitation is not  visualized. Aortic valve sclerosis/calcification is present, without any  evidence of aortic stenosis.   5. The inferior vena cava is dilated in size with >50% respiratory  variability, suggesting right atrial pressure of 8 mmHg.   Conclusion(s)/Recommendation(s): No evidence of valvular vegetations on  this transthoracic echocardiogram. Consider [name] transesophageal  echocardiogram to exclude infective endocarditis if clinically indicated.   Antimicrobials:   Anti-infectives (From admission, onward)    Start     Dose/Rate Route Frequency Ordered Stop   08/22/23 1400  valACYclovir (VALTREX) tablet 1,000 mg        1,000 mg Oral Daily 08/22/23 1312     08/22/23 1115  valACYclovir (VALTREX) tablet 1,000 mg  Status:  Discontinued        1,000 mg Oral Daily 08/22/23 1024 08/22/23 1025       Subjective: Thinks the steroids are helping  Objective: Vitals:   08/22/23 1334 08/22/23 1944 08/23/23 0531 08/23/23 1223  BP: 129/73 118/70 114/79 131/80  Pulse: 87 89 99 99  Resp: 18 16 16 18   Temp: 98.1 F (36.7 C) 98.6 F (37 C) 98 F (36.7 C) 97.7 F (36.5 C)  TempSrc: Oral  Oral Oral  SpO2: 99% 94% 94% 96%  Weight:      Height:        Intake/Output Summary (Last 24 hours) at 08/23/2023 1343 Last data filed at 08/23/2023 0926 Gross per 24 hour  Intake 336 ml  Output 800 ml  Net -464 ml    Filed Weights   08/16/23 1213  Weight: 85.7 kg    Examination:  General: No acute distress. Cardiovascular: RRR Lungs: unlabored Neurological: Alert and oriented 3. Moves all extremities 4 with equal strength. Cranial nerves II through XII grossly intact. Skin: maculopapular rash to back  Extremities: bilateral LE edema  Data Reviewed: I have personally reviewed following labs and imaging studies  CBC: Recent Labs  Lab 08/19/23 1822 08/20/23 0807 08/21/23 0808 08/22/23 1649 08/23/23 0751  WBC 6.7 6.7 6.2 7.5 4.4  NEUTROABS 2.9 2.5 2.4 2.9 3.6  HGB 10.5* 9.8* 8.9* 10.1* 9.4*  HCT 31.0* 28.6* 26.5* 29.8* 28.4*  MCV 98.4 96.6 98.9 97.7 99.3  PLT 226 229 210 240 233    Basic Metabolic Panel: Recent Labs  Lab 08/19/23 0525 08/19/23 1822 08/20/23 0807 08/21/23 0808 08/22/23 1649 08/23/23 0751  NA 127* 131* 131* 130* 130* 132*  K 4.0 4.0 3.9 3.6 4.1 4.0  CL 99 105 106 107 105 111  CO2 19* 20* 19* 18* 18* 17*  GLUCOSE 94 114* 90 87 93 135*  BUN 15 13 13 10 13 16   CREATININE 1.51* 1.46* 1.48* 1.24 1.35* 1.25*  CALCIUM  7.9* 7.6* 7.4* 7.2* 7.8* 7.4*  MG 1.7  --  1.6* 1.6* 1.7 1.8  PHOS 4.0  --  3.8 3.1 3.2 3.3    GFR: Estimated Creatinine Clearance: 52.1 mL/min (Arda Daggs) (by C-G formula based on SCr of 1.25 mg/dL (H)).  Liver Function Tests: Recent Labs  Lab 08/19/23 0525 08/20/23 0807 08/21/23 0808 08/22/23 1649 08/23/23 0751  AST 79* 71* 68* 67* 51*  ALT 57* 48* 46* 44 39  ALKPHOS 122 115 110 116 107  BILITOT 1.5* 1.3* 1.1 0.9 1.0  PROT 5.3* 5.1* 4.9* 5.2* 5.2*  ALBUMIN 2.7* 2.6* 2.4* 2.6* 2.5*    CBG: No results for input(s): "GLUCAP" in the last 168 hours.   Recent Results (from the past 240 hour(s))  SARS Coronavirus 2 by RT PCR (hospital order, performed in [facility] hospital lab) *cepheid single result test* Anterior Nasal Swab     Status: None   Collection Time: 08/16/23  3:17 PM   Specimen: Anterior Nasal Swab  Result Value Ref Range Status   SARS Coronavirus 2 by RT PCR NEGATIVE NEGATIVE Final    Comment: (NOTE) SARS-CoV-2 target nucleic acids are NOT DETECTED.  The SARS-CoV-2 RNA is generally detectable in upper and lower respiratory specimens during the acute phase of infection. The lowest concentration of SARS-CoV-2 viral copies this assay can detect is 250 copies / mL. Kosha Jaquith negative result does not preclude SARS-CoV-2 infection and should not be used as the sole basis for treatment or other patient management decisions.  Nikkita Adeyemi negative result may occur with improper specimen collection / handling, submission of specimen other than nasopharyngeal swab, presence of viral mutation(s) within the areas targeted by this assay, and inadequate number of viral copies (<250 copies / mL). Reshunda Strider negative result must be combined with clinical observations, patient history, and epidemiological information.  Fact Sheet for Patients:   RoadLapTop.co.za  Fact Sheet for Healthcare Providers: http://kim-miller.com/  This test is not yet approved or   cleared by the [place] FDA and has been authorized for detection and/or diagnosis of SARS-CoV-2 by FDA under an Emergency Use Authorization (EUA).  This EUA will remain in effect (meaning this test can be used) for the duration of the COVID-19 declaration under Section 564(b)(1) of the Act, 21 U.S.C. section 360bbb-3(b)(1), unless the authorization is terminated or revoked sooner.  Performed at [facility], [address] [place], [place] [address]   Culture, blood (Routine X 2) w Reflex to ID Panel     Status: None   Collection Time: 08/17/23  5:27 AM   Specimen: BLOOD  Result Value Ref Range Status  Specimen Description   Final    BLOOD BLOOD RIGHT ARM Performed at [facility], [address] [place], [place] [address]    Special Requests   Final    BOTTLES DRAWN AEROBIC AND ANAEROBIC Blood Culture adequate volume Performed at [facility], [address] [place], [place] [address]    Culture   Final    NO GROWTH 5 DAYS Performed at [facility], [address], [place], [place] [address]    Report Status 08/22/2023 FINAL  Final  Culture, blood (Routine X 2) w Reflex to ID Panel     Status: None   Collection Time: 08/17/23  5:27 AM   Specimen: BLOOD  Result Value Ref Range Status   Specimen Description   Final    BLOOD BLOOD RIGHT ARM Performed at [facility], [address], [place], [place] [address]    Special Requests   Final    BOTTLES DRAWN AEROBIC AND ANAEROBIC Blood Culture adequate volume Performed at [facility], [address] [place], [place] [address]    Culture   Final    NO GROWTH 5 DAYS Performed at [facility], [address] [place], [place] [address]    Report Status 08/22/2023 FINAL  Final         Radiology Studies: No results found.      Scheduled Meds:  enoxaparin (LOVENOX) injection  40 mg Subcutaneous Q24H    feeding supplement  237 mL Oral TID BM   methylPREDNISolone (SOLU-MEDROL) injection  40 mg Intravenous Q12H   pilocarpine  5 mg Oral BID   valACYclovir  1,000 mg Oral Daily   Continuous Infusions:  sodium chloride 125 mL/hr at 08/23/23 0844     LOS: 5 days    Time spent: over 30 min    Lacretia Nicks, MD [facility]   To contact the attending provider between 7A-7P or the covering provider during after hours 7P-7A, please log into the web site [email or web address] and access using universal [facility] password for that web site. If you do not have the password, please call the hospital operator.  08/23/2023, 1:43 PM

## 2023-08-23 NOTE — Progress Notes (Signed)
Surprisingly, Sean Skinner feels a lot better.  He says he is able to eat a little bit more.  Hopefully, the pilocarpine might be working.  I know he was started on some Solu-Medrol yesterday.  I think this is a good idea.  It is still hard to say whether or not this is the DRESS syndrome.  I know that he has had immunotherapy but his last treatment was 4 months ago.  I do appreciate ENT seeing him and as always, making very helpful recommendations.  Again, he had 1 episode of vomiting yesterday.  This is after he had Ensure.  I probably would try to avoid this.  There is no lab work back yet today.  He has had no diarrhea.  He is out of bed.  He is walking.  He has had no cough.  His vital signs are temperature 98.  Pulse 99.  Blood pressure 114/79.  Head and neck exam shows no ocular or oral lesions.  His oral mucosa still very dry.  There is no mucositis or thrush.  Lungs are clear.  Cardiac exam regular rate and rhythm.  Abdomen soft.  Bowel sounds are present.  There is no guarding or rebound tenderness.  Extremities shows no clubbing, cyanosis or edema.  Again, the pilocarpine seems to be helping.  The steroids also might be helping a little bit.  It is conceivable that he we will be able to go home soon.  I would like to make sure that he is able to eat.  I would watch him for the next 1-2 days just to make sure that he is keeping down when he eats.  We we will have to see what his labs look like.  He is getting incredible care from everybody on 5 E.   Christin Bach, MD  2 Cor 12:10

## 2023-08-23 NOTE — Care Management Important Message (Signed)
Important Message  Patient Details IM Letter given. Name: Sean Skinner MRN: [id number] Date of Birth: [birth year]   Medicare Important Message Given:  Yes     Caren Macadam 08/23/2023, 1:36 PM

## 2023-08-24 ENCOUNTER — Other Ambulatory Visit (HOSPITAL_COMMUNITY): Payer: Self-pay

## 2023-08-24 DIAGNOSIS — N179 Acute kidney failure, unspecified: Secondary | ICD-10-CM | POA: Diagnosis not present

## 2023-08-24 LAB — HEMOGLOBIN A1C
Hgb A1c MFr Bld: 5.3 % (ref 4.8–5.6)
Mean Plasma Glucose: 105.41 mg/dL

## 2023-08-24 LAB — MYCOPLASMA PNEUMONIAE ANTIBODY, IGM: Mycoplasma pneumo IgM: <770 U/mL (ref 0–769)

## 2023-08-24 MED ORDER — PANTOPRAZOLE SODIUM 40 MG PO TBEC
40.0000 mg | DELAYED_RELEASE_TABLET | Freq: Every day | ORAL | Status: DC
  Administered 2023-08-25: 40 mg via ORAL
  Filled 2023-08-24 (×2): qty 1

## 2023-08-24 MED ORDER — PILOCARPINE HCL 5 MG PO TABS
5.0000 mg | ORAL_TABLET | Freq: Two times a day (BID) | ORAL | 1 refills | Status: AC
  Filled 2023-08-24: qty 60, 30d supply, fill #0

## 2023-08-24 MED ORDER — PREDNISONE 10 MG PO TABS
ORAL_TABLET | ORAL | 0 refills | Status: AC
  Filled 2023-08-24: qty 74, 35d supply, fill #0

## 2023-08-24 MED ORDER — PANTOPRAZOLE SODIUM 40 MG PO TBEC
40.0000 mg | DELAYED_RELEASE_TABLET | Freq: Every day | ORAL | 1 refills | Status: DC
  Filled 2023-08-24: qty 30, 30d supply, fill #0

## 2023-08-24 NOTE — Progress Notes (Signed)
Mobility Specialist - Progress Note   08/24/23 1506  Mobility  Activity Off unit  Level of Assistance Modified independent, requires aide device or extra time  Assistive Device Wheelchair  Activity Response Tolerated well  Mobility Referral Yes  $Mobility charge 1 Mobility  Mobility Specialist Start Time (ACUTE ONLY) 0240  Mobility Specialist Stop Time (ACUTE ONLY) 0300  Mobility Specialist Time Calculation (min) (ACUTE ONLY) 20 min   Pt received in room standing and agreeable to go outside. Pt tolerated walking around [facility] x1. No complaints during session. Pt to room after session with all needs met and wife in room.    [facility]

## 2023-08-24 NOTE — Progress Notes (Signed)
Sean Skinner continues to improve.  He ate 3 meals yesterday.  There is no vomiting.  He walked quite a bit.  I really think he is able to go home from my perspective.  He is able to swallow better.  Again, the pilocarpine is helping.  I suspect that the steroids could certainly have helped.  I do believe that this probably was a form of DRESS.  There is no eosinophils now.  His labs yesterday showed a white count 4.4.  Hemoglobin 9.4.  Blood count 233,000.  Sodium 132.  Potassium 4.0.  BUN 16 creatinine 1.25.  Calcium 7.4 with an albumin of 2.5.  The albumin is coming up.  His SGPT is 39 and the SGOT is 51.  These are also improving with steroids.  Again, I really do not see that he could not go home.  He is on a steroid taper which I think is clearly going to help.  He is still a little anemic.  This may be from blood draws.  I do not think he is really symptomatic from the anemia.  His vital signs show temperature of 97.8.  Pulse 78.  Blood pressure 120/68.  Head and neck exam shows no ocular or oral lesions.  There may be a little more moisture in the mouth.  There is no adenopathy.  Lungs are clear bilaterally.  He has good air movement bilaterally.  Cardiac exam regular rate and rhythm.  He has no murmurs.  Abdomen soft.  Bowel sounds are present.  There is no fluid wave.  There is no palpable liver or spleen tip.  Extremity shows no clubbing, or cyanosis.  He does have some 1+ edema in the legs.  Neurological exam is nonfocal.   [name] looks like he may have had a variant of DRESS syndrome.  The steroids certainly have helped.  Hopefully, he will go home today.  He will be able to follow-up down to [facility] [place] with his oncologist.  He obviously has had incredible care from everybody upon 5 E.   Christin Bach, MD  Charmian Muff 5:14

## 2023-08-24 NOTE — Plan of Care (Signed)
Uneventful PM shift, Continuous IV fluids maintained. No c/o pain or nausea.   Problem: Education: Goal: Knowledge of General Education information will improve Description: Including pain rating scale, medication(s)/side effects and non-pharmacologic comfort measures Outcome: Progressing   Problem: Health Behavior/Discharge Planning: Goal: Ability to manage health-related needs will improve Outcome: Progressing   Problem: Clinical Measurements: Goal: Ability to maintain clinical measurements within normal limits will improve Outcome: Progressing Goal: Will remain free from infection Outcome: Progressing Goal: Diagnostic test results will improve Outcome: Progressing Goal: Respiratory complications will improve Outcome: Progressing Goal: Cardiovascular complication will be avoided Outcome: Progressing   Problem: Activity: Goal: Risk for activity intolerance will decrease Outcome: Progressing   Problem: Nutrition: Goal: Adequate nutrition will be maintained Outcome: Progressing   Problem: Coping: Goal: Level of anxiety will decrease Outcome: Progressing   Problem: Elimination: Goal: Will not experience complications related to bowel motility Outcome: Progressing Goal: Will not experience complications related to urinary retention Outcome: Progressing   Problem: Pain Managment: Goal: General experience of comfort will improve Outcome: Progressing   Problem: Safety: Goal: Ability to remain free from injury will improve Outcome: Progressing   Problem: Skin Integrity: Goal: Risk for impaired skin integrity will decrease Outcome: Progressing

## 2023-08-24 NOTE — Discharge Summary (Signed)
Physician Discharge Summary  [name]:[id number] DOB: 09/02/1941 DOA: 08/16/2023  PCP: Emilio Aspen, MD  Admit date: 08/16/2023 Discharge date: 08/24/2023  Time spent: 40 minutes  Recommendations for Outpatient Follow-up:  Follow outpatient CBC/CMP  Follow steroids with oncology outpatient Follow pilocarpine outpatient, adjust dose as needed Pulmonary nodules, needs repeat imaging   Discharge Diagnoses:  Principal Problem:   AKI (acute kidney injury) (HCC) Active Problems:   Pulmonary nodules   Squamous cell carcinoma of scalp   Nausea and vomiting   Murmur   Failure to thrive in adult   Discharge Condition: stable  Diet recommendation: heart healthy  Filed Weights   08/16/23 1213  Weight: 85.7 kg    History of present illness:   Sean Skinner is Sean Skinner 82 y.o. 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   Pt presents to ED with 2 week h/o dry mouth, emesis, fatigue, BLE edema.   Patient says that for the past 2 weeks he has not had any saliva production and his mouth has been extremely dry.   He was noted to have AKI and elevated LFT's.  Notably had rash to back and eosinophilia.  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  Has improved with steroids and pilocarpine.  See below for additional details.  Discharge 8/28 if labs stable off IV fluids.     Hospital Course:  Assessment and Plan:  Xerostomia  Eosinophilia  Delayed Immune Related Adverse Event  Dress Syndrome Seems like this was rather sudden [name] few weeks ago.  Suspect related to cemiplimab -> possible per discussion with her oncologist, possible delayed immune related adverse event?  Based on my discussion with Dr. [name], unlikely related to history of  radiation.   Given eosinophilia and rash (noted to back) in addition to abnormal LFT's and aki, suspect this represents DRESS syndrome (which is one of the severe cutaneous adverse reactions which can be an immune related cutaneous adverse event).  Xerostomia is also [name] known adverse effect, but described associated with the musculoskeletal toxicities (which he doesn't seem to have).  Of note, valtrex associated with DRESS syndrome, but doubt this is culprit as he's been on this continuously over past 4 years.    Meets criteria for steroids with rash of 10-30% body surface area  Solumedrol, will need long prednisone taper over at least 4 weeks Biotene, pilocarpine (will increase dose as tolerated) Additional workup as indicated -> will follow sjogren's labs (negative SSA, negative SSB), TSH (elevated - free T4 normal), negative ANA, negative mycoplasma, negative acute hepatitis panel, blood cultures NGTD Smear with eosinophilia, b12 wnl  Eosinophilia has improved markedly after initiation of steroids Notes improvement in xerostomia as well Appreciate ENT recs (8/25) Appreciate oncology recs I've asked our oncologist here to comment as well   Management of Immune-Related Adverse Events in Patients Treated With Immune Checkpoint Inhibitor Therapy: ASCO Guideline Update  Journal of Clinical Oncology ([email or web address])    AKI  Hyponatremia Baseline creatinine from outside records appears to be around 1.13 (03/2023).  Presumed secondary to dehydration in the setting of dry mouth discourages patient from taking PO as often as he should.  Gradually improving today Will continue IVF (transition to normal saline) and continue to trend UA bland CT without hydro Appreciate renal  assistance  Nausea/vomiting In part related to patient's dry mouth, it seems. No current nausea. CT without acute abnormality -Continue Zofran PRN   Zoster Notably, has been on valtrex for about 4 years (of note, this is  associated with dress, but doubt this is cause given his being on this chronically over 4 years)  Continue valtrex    Pulmonary nodules Bilateral. Noted on imaging. Pulmonology consulted and recommend repeat CT in 3-6 months unless patient's oncologist determines otherwise.   Hyponatremia Mild Follow    Elevated AST/ALT Hyperbilirubinemia RUQ significant for steatosis. Suspect related to DRESS syndrome above  Murmur Concern for endocarditis on admission -> blood cultures negative to date x3 days, echo without evidence of valvular vegetations.  Will continue to monitor.    Procedures:  Echo IMPRESSIONS     1. Left ventricular ejection fraction, by estimation, is 60 to 65%. The  left ventricle has normal function. The left ventricle has no regional  wall motion abnormalities. There is mild concentric left ventricular  hypertrophy. Left ventricular diastolic  parameters were normal.   2. Right ventricular systolic function is normal. The right ventricular  size is mildly enlarged. There is normal pulmonary artery systolic  pressure. The estimated right ventricular systolic pressure is 25.8 mmHg.   3. The mitral valve is normal in structure. No evidence of mitral valve  regurgitation. No evidence of mitral stenosis.   4. The aortic valve is tricuspid. Aortic valve regurgitation is not  visualized. Aortic valve sclerosis/calcification is present, without any  evidence of aortic stenosis.   5. The inferior vena cava is dilated in size with >50% respiratory  variability, suggesting right atrial pressure of 8 mmHg.   Conclusion(s)/Recommendation(s): No evidence of valvular vegetations on  this transthoracic echocardiogram. Consider Heyden Jaber transesophageal  echocardiogram to exclude infective endocarditis if clinically indicated.   Consultations: Oncology ENT  Discharge Exam: Vitals:   08/24/23 0508 08/24/23 1317  BP: 120/68 128/85  Pulse: 78 80  Resp: (!) 22   Temp: 97.8 F  (36.6 C) 97.6 F (36.4 C)  SpO2: 94% 99%   No complaints Wife at bedside  General: No acute distress. In shorts and T shirt, waiting to walk outside. Lungs: unlabored Neurological: Alert and oriented 3. Moves all extremities 4 with equal strength. Cranial nerves II through XII grossly intact. Extremities: No clubbing or cyanosis. No edema.   Discharge Instructions   Discharge Instructions     Call MD for:  difficulty breathing, headache or visual disturbances   Complete by: As directed    Call MD for:  extreme fatigue   Complete by: As directed    Call MD for:  hives   Complete by: As directed    Call MD for:  persistant dizziness or light-headedness   Complete by: As directed    Call MD for:  persistant nausea and vomiting   Complete by: As directed    Call MD for:  redness, tenderness, or signs of infection (pain, swelling, redness, odor or green/yellow discharge around incision site)   Complete by: As directed    Call MD for:  severe uncontrolled pain   Complete by: As directed    Call MD for:  temperature >100.4   Complete by: As directed    Diet - low sodium heart healthy   Complete by: As directed    Discharge instructions   Complete by: As directed    You were seen for xerostomia (dry mouth).  You also had abnormal labs (including  elevated liver function tests and acute kidney injury).   Ultimately, we think this was related to your PD1 inhibitor (cemiplimab).  You had Tonae Livolsi rash on your back and eosinophilia in addition to abnormal liver function and kidney function.  We treated you for DRESS syndrome (also known as drug induced hypersensitivity syndrome).  We're sending you home with Puanani Gene long steroid taper as well.  You should follow up with your oncologist as an outpatient.    We're sending you with pilocarpine.  This dose can be increased if needed.   Return for new, recurrent, or worsening symptoms.  Please ask your PCP to request records from this  hospitalization so they know what was done and what the next steps will be.   Increase activity slowly   Complete by: As directed    No wound care   Complete by: As directed       Allergies as of 08/24/2023   No Known Allergies      Medication List     TAKE these medications    cyanocobalamin 1000 MCG tablet Commonly known as: VITAMIN B12 Take 1,000 mcg by mouth daily.   pantoprazole 40 MG tablet Commonly known as: PROTONIX Take 1 tablet (40 mg total) by mouth daily.   pilocarpine 5 MG tablet Commonly known as: SALAGEN Take 1 tablet (5 mg total) by mouth 2 (two) times daily. Follow up with your oncologist for refills.  This medication can be increased if needed.   predniSONE 10 MG tablet Commonly known as: DELTASONE Take 4 tablets (40 mg total) by mouth daily with breakfast for 7 days, THEN 3 tablets (30 mg total) daily with breakfast for 7 days, THEN 2 tablets (20 mg total) daily with breakfast for 7 days, THEN 1 tablet (10 mg total) daily with breakfast for 7 days, THEN 0.5 tablets (5 mg total) daily with breakfast for 7 days. Follow up with your oncologist before discontinuing steroids.. Start taking on: August 25, 2023   valACYclovir 1000 MG tablet Commonly known as: VALTREX       No Known Allergies    The results of significant diagnostics from this hospitalization (including imaging, microbiology, ancillary and laboratory) are listed below for reference.    Significant Diagnostic Studies: ECHOCARDIOGRAM COMPLETE  Result Date: 08/17/2023    ECHOCARDIOGRAM REPORT   Patient Name:   Hazem P Busey Date of Exam: 08/17/2023 Medical Rec #:  [id number]        Height:       74.0 in Accession #:    [id number]       Weight:       189.0 lb Date of Birth:  October 16, 1941        BSA:          2.122 m Patient Age:    82 years         BP:           107/60 mmHg Patient Gender: M                HR:           90 bpm. Exam Location:  Inpatient Procedure: 2D Echo, Cardiac Doppler and  Color Doppler Indications:    Murmur                 Endocarditis  History:        Patient has no prior history of Echocardiogram examinations.  Signs/Symptoms:Fatigue and Edema.  Sonographer:    Milda Smart Referring Phys: [phone] JARED M GARDNER  Sonographer Comments: Image acquisition challenging due to respiratory motion. IMPRESSIONS  1. Left ventricular ejection fraction, by estimation, is 60 to 65%. The left ventricle has normal function. The left ventricle has no regional wall motion abnormalities. There is mild concentric left ventricular hypertrophy. Left ventricular diastolic parameters were normal.  2. Right ventricular systolic function is normal. The right ventricular size is mildly enlarged. There is normal pulmonary artery systolic pressure. The estimated right ventricular systolic pressure is 25.8 mmHg.  3. The mitral valve is normal in structure. No evidence of mitral valve regurgitation. No evidence of mitral stenosis.  4. The aortic valve is tricuspid. Aortic valve regurgitation is not visualized. Aortic valve sclerosis/calcification is present, without any evidence of aortic stenosis.  5. The inferior vena cava is dilated in size with >50% respiratory variability, suggesting right atrial pressure of 8 mmHg. Conclusion(s)/Recommendation(s): No evidence of valvular vegetations on this transthoracic echocardiogram. Consider Amerah Puleo transesophageal echocardiogram to exclude infective endocarditis if clinically indicated. FINDINGS  Left Ventricle: Left ventricular ejection fraction, by estimation, is 60 to 65%. The left ventricle has normal function. The left ventricle has no regional wall motion abnormalities. The left ventricular internal cavity size was normal in size. There is  mild concentric left ventricular hypertrophy. Left ventricular diastolic parameters were normal. Normal left ventricular filling pressure. Right Ventricle: The right ventricular size is mildly enlarged. No  increase in right ventricular wall thickness. Right ventricular systolic function is normal. There is normal pulmonary artery systolic pressure. The tricuspid regurgitant velocity is 2.11  m/s, and with an assumed right atrial pressure of 8 mmHg, the estimated right ventricular systolic pressure is 25.8 mmHg. Left Atrium: Left atrial size was normal in size. Right Atrium: Right atrial size was normal in size. Pericardium: There is no evidence of pericardial effusion. Mitral Valve: The mitral valve is normal in structure. No evidence of mitral valve regurgitation. No evidence of mitral valve stenosis. Tricuspid Valve: The tricuspid valve is normal in structure. Tricuspid valve regurgitation is not demonstrated. No evidence of tricuspid stenosis. Aortic Valve: The aortic valve is tricuspid. Aortic valve regurgitation is not visualized. Aortic valve sclerosis/calcification is present, without any evidence of aortic stenosis. Pulmonic Valve: The pulmonic valve was normal in structure. Pulmonic valve regurgitation is trivial. No evidence of pulmonic stenosis. Aorta: The aortic root is normal in size and structure. Venous: The inferior vena cava is dilated in size with greater than 50% respiratory variability, suggesting right atrial pressure of 8 mmHg. IAS/Shunts: No atrial level shunt detected by color flow Doppler.  LEFT VENTRICLE PLAX 2D LVIDd:         4.50 cm     Diastology LVIDs:         3.30 cm     LV e' medial:    7.72 cm/s LV PW:         1.30 cm     LV E/e' medial:  7.7 LV IVS:        1.20 cm     LV e' lateral:   11.90 cm/s LVOT diam:     2.10 cm     LV E/e' lateral: 5.0 LV SV:         82 LV SV Index:   39 LVOT Area:     3.46 cm  LV Volumes (MOD) LV vol d, MOD A2C: 95.6 ml LV vol d, MOD A4C: 99.3 ml LV vol s,  MOD A2C: 32.7 ml LV vol s, MOD A4C: 32.2 ml LV SV MOD A2C:     62.9 ml LV SV MOD A4C:     99.3 ml LV SV MOD BP:      65.5 ml RIGHT VENTRICLE             IVC RV Basal diam:  4.30 cm     IVC diam: 2.20 cm RV  Mid diam:    3.90 cm RV S prime:     14.80 cm/s TAPSE (M-mode): 3.2 cm LEFT ATRIUM             Index        RIGHT ATRIUM           Index LA diam:        4.00 cm 1.89 cm/m   RA Area:     13.90 cm LA Vol (A2C):   47.6 ml 22.43 ml/m  RA Volume:   32.50 ml  15.32 ml/m LA Vol (A4C):   40.5 ml 19.09 ml/m LA Biplane Vol: 47.3 ml 22.29 ml/m  AORTIC VALVE LVOT Vmax:   129.00 cm/s LVOT Vmean:  85.000 cm/s LVOT VTI:    0.237 m  AORTA Ao Root diam: 3.30 cm Ao Asc diam:  4.00 cm MITRAL VALVE               TRICUSPID VALVE MV Area (PHT): 2.34 cm    TR Peak grad:   17.8 mmHg MV Decel Time: 324 msec    TR Vmax:        211.00 cm/s MV E velocity: 59.60 cm/s MV Paeton Studer velocity: 69.40 cm/s  SHUNTS MV E/Alexei Doswell ratio:  0.86        Systemic VTI:  0.24 m                            Systemic Diam: 2.10 cm Armanda Magic MD Electronically signed by Armanda Magic MD Signature Date/Time: 08/17/2023/1:00:49 PM    Final    CT CHEST ABDOMEN PELVIS WO CONTRAST  Result Date: 08/16/2023 CLINICAL DATA:  Dry mouth, nausea, emesis. Chills. Lethargy. Squamous cell cancer on top of head. Lower extremity swelling. EXAM: CT CHEST, ABDOMEN AND PELVIS WITHOUT CONTRAST TECHNIQUE: Multidetector CT imaging of the chest, abdomen and pelvis was performed following the standard protocol without IV contrast. RADIATION DOSE REDUCTION: This exam was performed according to the departmental dose-optimization program which includes automated exposure control, adjustment of the mA and/or kV according to patient size and/or use of iterative reconstruction technique. COMPARISON:  Same day chest radiograph FINDINGS: CT CHEST FINDINGS Cardiovascular: Coronary artery and aortic atherosclerotic calcification. No pericardial effusion. Dilated ascending aorta measuring 41 mm in diameter. Mediastinum/Nodes: Trachea and esophagus are unremarkable. No thoracic adenopathy noting limitations of noncontrast exam. Lungs/Pleura: Bibasilar atelectasis/scarring. No pleural effusion or  pneumothorax. Multiple bilateral pulmonary nodules. For example Latisia Hilaire left lower lobe subpleural nodule measures 8 x 4 mm on series 4/image 88. Kaspian Muccio left upper lobe perifissural nodule measures 5 x 7 mm on 4/54. 6 x 12 mm nodular thickening along or within the right major fissure (4/73). Teondre Jarosz right upper lobe perifissural nodule measures 5 x 6 mm on 4/41. Musculoskeletal: No acute fracture or destructive osseous lesion. CT ABDOMEN PELVIS FINDINGS Hepatobiliary: Unremarkable noncontrast appearance of the liver, gallbladder, biliary tree. Pancreas: Unremarkable. Spleen: Unremarkable. Adrenals/Urinary Tract: Contrast from previous CT neck within the ureters and bladder. No hydronephrosis. Unremarkable adrenal glands. Stomach/Bowel: Normal caliber  large and small bowel. No bowel wall thickening. Stomach and appendix are within normal limits. Vascular/Lymphatic: No significant vascular findings are present. No enlarged abdominal or pelvic lymph nodes. Reproductive: No acute abnormality. Other: No free intraperitoneal fluid or air. Musculoskeletal: No acute fracture or destructive osseous lesion. IMPRESSION: 1. No acute abnormality in the chest, abdomen, or pelvis. 2. Multiple bilateral pulmonary nodules measuring up to 9 mm (average dimension). Non-contrast chest CT at 3-6 months is recommended. If the nodules are stable at time of repeat CT, then future CT at 18-24 months (from today's scan) is considered optional for low-risk patients, but is recommended for high-risk patients. This recommendation follows the consensus statement: Guidelines for Management of Incidental Pulmonary Nodules Detected on CT Images: From the Fleischner Society 2017; Radiology 2017; 284:228-243. Aortic Atherosclerosis (ICD10-I70.0). Electronically Signed   By: Minerva Fester M.D.   On: 08/16/2023 20:43   CT Soft Tissue Neck W Contrast  Result Date: 08/16/2023 CLINICAL DATA:  Occult malignancy Left sided facial swelling, ? salivary tumor? No saliva  production EXAM: CT NECK WITH CONTRAST TECHNIQUE: Multidetector CT imaging of the neck was performed using the standard protocol following the bolus administration of intravenous contrast. RADIATION DOSE REDUCTION: This exam was performed according to the departmental dose-optimization program which includes automated exposure control, adjustment of the mA and/or kV according to patient size and/or use of iterative reconstruction technique. CONTRAST:  75mL OMNIPAQUE IOHEXOL 300 MG/ML  SOLN COMPARISON:  CT of the neck 01/18/2023. FINDINGS: Pharynx and larynx: Normal. No mass or swelling. Streak artifact from dental amalgam limits assessment. Salivary glands: No visible mass lesion or inflammatory change. No stone. Thyroid: Subcentimeter right thyroid nodule which does not require further imaging follow-up (ref: J Am Coll Radiol. 2015 Feb;12(2): 143-50). Lymph nodes: None enlarged or abnormal density. Vascular: Negative. Limited intracranial: Negative. Visualized orbits: Negative. Mastoids and visualized paranasal sinuses: Mild inferior maxillary sinus mucosal thickening. Otherwise, clear. Skeleton: No evidence of acute abnormality on limited assessment. Upper chest: Visualized lung apices are clear. IMPRESSION: No visible mass lesion or appreciable inflammatory change. If there is high clinical concern for malignancy, MRI face or neck with contrast could provide further evaluation. Electronically Signed   By: Feliberto Harts M.D.   On: 08/16/2023 18:41   DG Chest Portable 1 View  Result Date: 08/16/2023 CLINICAL DATA:  Weakness EXAM: PORTABLE CHEST 1 VIEW COMPARISON:  None Available. FINDINGS: The heart size and mediastinal contours are within normal limits. Both lungs are clear. The visualized skeletal structures are unremarkable. IMPRESSION: No active disease. Electronically Signed   By: Layla Maw M.D.   On: 08/16/2023 17:38   US Abdomen Limited RUQ (LIVER/GB)  Result Date: 08/16/2023 CLINICAL  DATA:  Elevated LFTs EXAM: ULTRASOUND ABDOMEN LIMITED RIGHT UPPER QUADRANT COMPARISON:  None Available. FINDINGS: Gallbladder: No gallstones or wall thickening visualized. No sonographic Murphy sign noted by sonographer. Common bile duct: Diameter: 6 mm Liver: Mildly increased echogenicity. No focal lesion. Portal vein is patent on color Doppler imaging with normal direction of blood flow towards the liver. Other: None. IMPRESSION: 1. No cholelithiasis or sonographic evidence for acute cholecystitis. 2. Mildly increased hepatic parenchymal echogenicity suggestive of steatosis. Electronically Signed   By: Annia Belt M.D.   On: 08/16/2023 16:38    Microbiology: Recent Results (from the past 240 hour(s))  SARS Coronavirus 2 by RT PCR (hospital order, performed in [facility] hospital lab) *cepheid single result test* Anterior Nasal Swab     Status: None   Collection  Time: 08/16/23  3:17 PM   Specimen: Anterior Nasal Swab  Result Value Ref Range Status   SARS Coronavirus 2 by RT PCR NEGATIVE NEGATIVE Final    Comment: (NOTE) SARS-CoV-2 target nucleic acids are NOT DETECTED.  The SARS-CoV-2 RNA is generally detectable in upper and lower respiratory specimens during the acute phase of infection. The lowest concentration of SARS-CoV-2 viral copies this assay can detect is 250 copies / mL. Mckinnley Smithey negative result does not preclude SARS-CoV-2 infection and should not be used as the sole basis for treatment or other patient management decisions.  Uziah Sorter negative result may occur with improper specimen collection / handling, submission of specimen other than nasopharyngeal swab, presence of viral mutation(s) within the areas targeted by this assay, and inadequate number of viral copies (<250 copies / mL). Shannin Naab negative result must be combined with clinical observations, patient history, and epidemiological information.  Fact Sheet for Patients:   RoadLapTop.co.za  Fact Sheet for  Healthcare Providers: http://kim-miller.com/  This test is not yet approved or  cleared by the [place] FDA and has been authorized for detection and/or diagnosis of SARS-CoV-2 by FDA under an Emergency Use Authorization (EUA).  This EUA will remain in effect (meaning this test can be used) for the duration of the COVID-19 declaration under Section 564(b)(1) of the Act, 21 U.S.C. section 360bbb-3(b)(1), unless the authorization is terminated or revoked sooner.  Performed at [facility], [address], [place], [place] [address]   Culture, blood (Routine X 2) w Reflex to ID Panel     Status: None   Collection Time: 08/17/23  5:27 AM   Specimen: BLOOD  Result Value Ref Range Status   Specimen Description   Final    BLOOD BLOOD RIGHT ARM Performed at [facility], [address], [place], [place] [address]    Special Requests   Final    BOTTLES DRAWN AEROBIC AND ANAEROBIC Blood Culture adequate volume Performed at [facility], [address] [place], [place] [address]    Culture   Final    NO GROWTH 5 DAYS Performed at [facility], [address], [place], [place] [address]    Report Status 08/22/2023 FINAL  Final  Culture, blood (Routine X 2) w Reflex to ID Panel     Status: None   Collection Time: 08/17/23  5:27 AM   Specimen: BLOOD  Result Value Ref Range Status   Specimen Description   Final    BLOOD BLOOD RIGHT ARM Performed at [facility], [address] [place], [place] [address]    Special Requests   Final    BOTTLES DRAWN AEROBIC AND ANAEROBIC Blood Culture adequate volume Performed at [facility], [address], [place], [place] [address]    Culture   Final    NO GROWTH 5 DAYS Performed at [facility], [address], [place], [place] [address]    Report Status 08/22/2023 FINAL  Final     Labs: Basic Metabolic  Panel: Recent Labs  Lab 08/19/23 0525 08/19/23 1822 08/20/23 0807 08/21/23 0808 08/22/23 1649 08/23/23 0751  NA 127* 131* 131* 130* 130* 132*  K 4.0 4.0 3.9 3.6 4.1 4.0  CL 99 105 106 107 105 111  CO2 19* 20* 19* 18* 18* 17*  GLUCOSE 94 114* 90 87 93 135*  BUN 15 13 13 10 13 16   CREATININE 1.51* 1.46* 1.48* 1.24 1.35* 1.25*  CALCIUM 7.9*  7.6* 7.4* 7.2* 7.8* 7.4*  MG 1.7  --  1.6* 1.6* 1.7 1.8  PHOS 4.0  --  3.8 3.1 3.2 3.3   Liver Function Tests: Recent Labs  Lab 08/19/23 0525 08/20/23 0807 08/21/23 0808 08/22/23 1649 08/23/23 0751  AST 79* 71* 68* 67* 51*  ALT 57* 48* 46* 44 39  ALKPHOS 122 115 110 116 107  BILITOT 1.5* 1.3* 1.1 0.9 1.0  PROT 5.3* 5.1* 4.9* 5.2* 5.2*  ALBUMIN 2.7* 2.6* 2.4* 2.6* 2.5*   No results for input(s): "LIPASE", "AMYLASE" in the last 168 hours. No results for input(s): "AMMONIA" in the last 168 hours. CBC: Recent Labs  Lab 08/19/23 1822 08/20/23 0807 08/21/23 0808 08/22/23 1649 08/23/23 0751  WBC 6.7 6.7 6.2 7.5 4.4  NEUTROABS 2.9 2.5 2.4 2.9 3.6  HGB 10.5* 9.8* 8.9* 10.1* 9.4*  HCT 31.0* 28.6* 26.5* 29.8* 28.4*  MCV 98.4 96.6 98.9 97.7 99.3  PLT 226 229 210 240 233   Cardiac Enzymes: No results for input(s): "CKTOTAL", "CKMB", "CKMBINDEX", "TROPONINI" in the last 168 hours. BNP: BNP (last 3 results) Recent Labs    08/16/23 1223 08/19/23 1246  BNP 84.5 130.9*    ProBNP (last 3 results) No results for input(s): "PROBNP" in the last 8760 hours.  CBG: No results for input(s): "GLUCAP" in the last 168 hours.     Signed:  Lacretia Nicks MD.  [facility] 08/24/2023, 2:59 PM

## 2023-08-25 ENCOUNTER — Other Ambulatory Visit (HOSPITAL_COMMUNITY): Payer: Self-pay

## 2023-08-25 DIAGNOSIS — R918 Other nonspecific abnormal finding of lung field: Secondary | ICD-10-CM | POA: Diagnosis not present

## 2023-08-25 DIAGNOSIS — N179 Acute kidney failure, unspecified: Secondary | ICD-10-CM | POA: Diagnosis not present

## 2023-08-25 LAB — CBC WITH DIFFERENTIAL/PLATELET
Abs Immature Granulocytes: 0.08 10*3/uL — ABNORMAL HIGH (ref 0.00–0.07)
Basophils Absolute: 0 10*3/uL (ref 0.0–0.1)
Basophils Relative: 0 %
Eosinophils Absolute: 0 10*3/uL (ref 0.0–0.5)
Eosinophils Relative: 0 %
HCT: 28.1 % — ABNORMAL LOW (ref 39.0–52.0)
Hemoglobin: 9.2 g/dL — ABNORMAL LOW (ref 13.0–17.0)
Immature Granulocytes: 1 %
Lymphocytes Relative: 9 %
Lymphs Abs: 0.9 10*3/uL (ref 0.7–4.0)
MCH: 32.4 pg (ref 26.0–34.0)
MCHC: 32.7 g/dL (ref 30.0–36.0)
MCV: 98.9 fL (ref 80.0–100.0)
Monocytes Absolute: 1 10*3/uL (ref 0.1–1.0)
Monocytes Relative: 10 %
Neutro Abs: 7.8 10*3/uL — ABNORMAL HIGH (ref 1.7–7.7)
Neutrophils Relative %: 80 %
Platelets: 248 10*3/uL (ref 150–400)
RBC: 2.84 MIL/uL — ABNORMAL LOW (ref 4.22–5.81)
RDW: 13 % (ref 11.5–15.5)
WBC: 9.7 10*3/uL (ref 4.0–10.5)
nRBC: 0 % (ref 0.0–0.2)

## 2023-08-25 LAB — BASIC METABOLIC PANEL
Anion gap: 5 (ref 5–15)
BUN: 29 mg/dL — ABNORMAL HIGH (ref 8–23)
CO2: 19 mmol/L — ABNORMAL LOW (ref 22–32)
Calcium: 8.1 mg/dL — ABNORMAL LOW (ref 8.9–10.3)
Chloride: 109 mmol/L (ref 98–111)
Creatinine, Ser: 1.26 mg/dL — ABNORMAL HIGH (ref 0.61–1.24)
GFR, Estimated: 57 mL/min — ABNORMAL LOW (ref >60)
Glucose, Bld: 107 mg/dL — ABNORMAL HIGH (ref 70–99)
Potassium: 4.1 mmol/L (ref 3.5–5.1)
Sodium: 133 mmol/L — ABNORMAL LOW (ref 135–145)

## 2023-08-25 NOTE — Plan of Care (Signed)
  Problem: Education: Goal: Knowledge of General Education information will improve Description: Including pain rating scale, medication(s)/side effects and non-pharmacologic comfort measures Outcome: Progressing   Problem: Health Behavior/Discharge Planning: Goal: Ability to manage health-related needs will improve Outcome: Progressing   Problem: Clinical Measurements: Goal: Ability to maintain clinical measurements within normal limits will improve Outcome: Progressing Goal: Will remain free from infection Outcome: Progressing Goal: Diagnostic test results will improve Outcome: Progressing Goal: Respiratory complications will improve Outcome: Progressing Goal: Cardiovascular complication will be avoided Outcome: Progressing  Pt A/Ox4 on RA. Up ad lib, walked halls. No complaints overnight.

## 2023-08-25 NOTE — Discharge Summary (Signed)
Physician Discharge Summary  Sean Skinner MBE:[id number] DOB: [birth year] DOA: 08/16/2023  PCP: Emilio Aspen, MD  Admit date: 08/16/2023 Discharge date: 08/25/2023  Time spent: 40 minutes  Recommendations for Outpatient Follow-up:  Follow outpatient CBC/CMP  Follow steroids with oncology outpatient Follow pilocarpine outpatient, adjust dose as needed Pulmonary nodules, needs repeat imaging   Discharge Diagnoses:  Principal Problem:   AKI (acute kidney injury) (HCC) Active Problems:   Pulmonary nodules   Squamous cell carcinoma of scalp   Nausea and vomiting   Murmur   Failure to thrive in adult   Discharge Condition: stable  Diet recommendation: heart healthy  Filed Weights   08/16/23 1213  Weight: 85.7 kg    History of present illness:   Sean Skinner is a 82 y.o. 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   Pt presents to ED with 2 week h/o dry mouth, emesis, fatigue, BLE edema.   Patient says that for the past 2 weeks he has not had any saliva production and his mouth has been extremely dry.   He was noted to have AKI and elevated LFT's.  Notably had rash to back and eosinophilia.  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  Has improved with steroids and pilocarpine.  See below for additional details.   Hospital Course:  Assessment and Plan:  Xerostomia  Eosinophilia  Delayed Immune Related Adverse Event  Dress Syndrome Seems like this was rather sudden a few weeks ago.  Suspect related to cemiplimab -> possible per discussion with her oncologist, possible delayed immune related adverse event?  Based on discussion with Dr. [name], unlikely related to history of radiation.   Given eosinophilia and rash (noted to  back) in addition to abnormal LFT's and aki, suspect this represents DRESS syndrome (which is one of the severe cutaneous adverse reactions which can be an immune related cutaneous adverse event).  Xerostomia is also a known adverse effect, but described associated with the musculoskeletal toxicities (which he doesn't seem to have).  Of note, valtrex associated with DRESS syndrome, but doubt this is culprit as he's been on this continuously over past 4 years.    Meets criteria for steroids with rash of 10-30% body surface area  Solumedrol, will need long prednisone taper over at least 4 weeks Biotene, pilocarpine  Additional workup as indicated -> will follow sjogren's labs (negative SSA, negative SSB), TSH (elevated - free T4 normal), negative ANA, negative mycoplasma, negative acute hepatitis panel, blood cultures NGTD Smear with eosinophilia, b12 wnl  Eosinophilia has improved markedly after initiation of steroids Notes improvement in xerostomia as well Appreciate ENT recs (8/25) Appreciate oncology recs   Management of Immune-Related Adverse Events in Patients Treated With Immune Checkpoint Inhibitor Therapy: ASCO Guideline Update  Journal of Clinical Oncology ([email or web address])    AKI  Hyponatremia Baseline creatinine from outside records appears to be around 1.13 (03/2023).  Presumed secondary to dehydration in the setting of dry mouth discourages patient from taking PO as often as he should.  Gradually improved - tolerated diet off IVF UA bland CT without hydro Appreciate renal assistance  Nausea/vomiting In part related to patient's dry mouth, it seems. No current nausea. CT without acute abnormality -Continue Zofran PRN   Zoster Notably, has been on valtrex for about  4 years (of note, this is associated with dress, but doubt this is cause given his being on this chronically over 4 years)  Continue valtrex    Pulmonary nodules Bilateral. Noted on imaging. Pulmonology consulted  and recommend repeat CT in 3-6 months unless patient's oncologist determines otherwise.   Hyponatremia Mild Follow    Elevated AST/ALT Hyperbilirubinemia RUQ significant for steatosis. Suspect related to DRESS syndrome above  Murmur Concern for endocarditis on admission -> blood cultures negative to date x3 days, echo without evidence of valvular vegetations.  Will continue to monitor.    Procedures:  Echo IMPRESSIONS     1. Left ventricular ejection fraction, by estimation, is 60 to 65%. The  left ventricle has normal function. The left ventricle has no regional  wall motion abnormalities. There is mild concentric left ventricular  hypertrophy. Left ventricular diastolic  parameters were normal.   2. Right ventricular systolic function is normal. The right ventricular  size is mildly enlarged. There is normal pulmonary artery systolic  pressure. The estimated right ventricular systolic pressure is 25.8 mmHg.   3. The mitral valve is normal in structure. No evidence of mitral valve  regurgitation. No evidence of mitral stenosis.   4. The aortic valve is tricuspid. Aortic valve regurgitation is not  visualized. Aortic valve sclerosis/calcification is present, without any  evidence of aortic stenosis.   5. The inferior vena cava is dilated in size with >50% respiratory  variability, suggesting right atrial pressure of 8 mmHg.   Conclusion(s)/Recommendation(s): No evidence of valvular vegetations on  this transthoracic echocardiogram. Consider a transesophageal  echocardiogram to exclude infective endocarditis if clinically indicated.   Consultations: Oncology ENT  Discharge Exam: Vitals:   08/24/23 1932 08/25/23 0521  BP: 124/79 (!) 136/91  Pulse: 84 66  Resp: 18 17  Temp: (!) 97.5 F (36.4 C) 97.6 F (36.4 C)  SpO2: 99% 97%  Physical Exam Constitutional:      Appearance: Normal appearance.  HENT:     Head: Normocephalic and atraumatic.     Comments: Bandage  noted on right scalp    Ears:     Comments: Bandage/dressing noted on left ear    Mouth/Throat:     Mouth: Mucous membranes are moist.  Eyes:     Extraocular Movements: Extraocular movements intact.  Cardiovascular:     Rate and Rhythm: Normal rate and regular rhythm.  Pulmonary:     Effort: Pulmonary effort is normal. No respiratory distress.     Breath sounds: Normal breath sounds. No wheezing.  Abdominal:     General: Bowel sounds are normal. There is no distension.     Palpations: Abdomen is soft.     Tenderness: There is no abdominal tenderness.  Musculoskeletal:        General: Normal range of motion.     Cervical back: Normal range of motion and neck supple.  Skin:    General: Skin is warm and dry.  Neurological:     General: No focal deficit present.     Mental Status: He is alert.  Psychiatric:        Mood and Affect: Mood normal.     Discharge Instructions   Discharge Instructions     Call MD for:  difficulty breathing, headache or visual disturbances   Complete by: As directed    Call MD for:  extreme fatigue   Complete by: As directed    Call MD for:  hives   Complete by: As directed  Call MD for:  persistant dizziness or light-headedness   Complete by: As directed    Call MD for:  persistant nausea and vomiting   Complete by: As directed    Call MD for:  redness, tenderness, or signs of infection (pain, swelling, redness, odor or green/yellow discharge around incision site)   Complete by: As directed    Call MD for:  severe uncontrolled pain   Complete by: As directed    Call MD for:  temperature >100.4   Complete by: As directed    Diet - low sodium heart healthy   Complete by: As directed    Diet - low sodium heart healthy   Complete by: As directed    Discharge instructions   Complete by: As directed    You were seen for xerostomia (dry mouth).  You also had abnormal labs (including elevated liver function tests and acute kidney injury).    Ultimately, we think this was related to your PD1 inhibitor (cemiplimab).  You had a rash on your back and eosinophilia in addition to abnormal liver function and kidney function.  We treated you for DRESS syndrome (also known as drug induced hypersensitivity syndrome).  We're sending you home with a long steroid taper as well.  You should follow up with your oncologist as an outpatient.    We're sending you with pilocarpine.  This dose can be increased if needed.   You had pulmonary nodules.  You'll need repeat imaging as an outpatient with your PCP or oncologist within 3 months (unless recommended otherwise by oncology).    Return for new, recurrent, or worsening symptoms.  Please ask your PCP to request records from this hospitalization so they know what was done and what the next steps will be.   Increase activity slowly   Complete by: As directed    Increase activity slowly   Complete by: As directed    No wound care   Complete by: As directed    No wound care   Complete by: As directed       Allergies as of 08/25/2023   No Known Allergies      Medication List     TAKE these medications    cyanocobalamin 1000 MCG tablet Commonly known as: VITAMIN B12 Take 1,000 mcg by mouth daily.   pantoprazole 40 MG tablet Commonly known as: PROTONIX Take 1 tablet (40 mg total) by mouth daily.   pilocarpine 5 MG tablet Commonly known as: SALAGEN Take 1 tablet (5 mg total) by mouth 2 (two) times daily. Follow up with your oncologist for refills.  This medication can be increased if needed.   predniSONE 10 MG tablet Commonly known as: DELTASONE Take 4 tablets by mouth daily with breakfast for 7 days, THEN 3 tablets daily for 7 days, THEN 2 tablets daily for 7 days, THEN 1 tablet daily for 7 days, THEN 1/2 tablet daily for 7 days. Follow up with your oncologist before discontinuing steroids.. Start taking on: August 25, 2023   valACYclovir 1000 MG tablet Commonly known as:  VALTREX       No Known Allergies    The results of significant diagnostics from this hospitalization (including imaging, microbiology, ancillary and laboratory) are listed below for reference.    Significant Diagnostic Studies: ECHOCARDIOGRAM COMPLETE  Result Date: 08/17/2023    ECHOCARDIOGRAM REPORT   Patient Name:   Mayukha Symmonds P Thull Date of Exam: 08/17/2023 Medical Rec #:  [id number]  Height:       74.0 in Accession #:    [id number]       Weight:       189.0 lb Date of Birth:  11-21-1941        BSA:          2.122 m Patient Age:    82 years         BP:           107/60 mmHg Patient Gender: M                HR:           90 bpm. Exam Location:  Inpatient Procedure: 2D Echo, Cardiac Doppler and Color Doppler Indications:    Murmur                 Endocarditis  History:        Patient has no prior history of Echocardiogram examinations.                 Signs/Symptoms:Fatigue and Edema.  Sonographer:    Milda Smart Referring Phys: [phone] JARED M GARDNER  Sonographer Comments: Image acquisition challenging due to respiratory motion. IMPRESSIONS  1. Left ventricular ejection fraction, by estimation, is 60 to 65%. The left ventricle has normal function. The left ventricle has no regional wall motion abnormalities. There is mild concentric left ventricular hypertrophy. Left ventricular diastolic parameters were normal.  2. Right ventricular systolic function is normal. The right ventricular size is mildly enlarged. There is normal pulmonary artery systolic pressure. The estimated right ventricular systolic pressure is 25.8 mmHg.  3. The mitral valve is normal in structure. No evidence of mitral valve regurgitation. No evidence of mitral stenosis.  4. The aortic valve is tricuspid. Aortic valve regurgitation is not visualized. Aortic valve sclerosis/calcification is present, without any evidence of aortic stenosis.  5. The inferior vena cava is dilated in size with >50% respiratory variability,  suggesting right atrial pressure of 8 mmHg. Conclusion(s)/Recommendation(s): No evidence of valvular vegetations on this transthoracic echocardiogram. Consider a transesophageal echocardiogram to exclude infective endocarditis if clinically indicated. FINDINGS  Left Ventricle: Left ventricular ejection fraction, by estimation, is 60 to 65%. The left ventricle has normal function. The left ventricle has no regional wall motion abnormalities. The left ventricular internal cavity size was normal in size. There is  mild concentric left ventricular hypertrophy. Left ventricular diastolic parameters were normal. Normal left ventricular filling pressure. Right Ventricle: The right ventricular size is mildly enlarged. No increase in right ventricular wall thickness. Right ventricular systolic function is normal. There is normal pulmonary artery systolic pressure. The tricuspid regurgitant velocity is 2.11  m/s, and with an assumed right atrial pressure of 8 mmHg, the estimated right ventricular systolic pressure is 25.8 mmHg. Left Atrium: Left atrial size was normal in size. Right Atrium: Right atrial size was normal in size. Pericardium: There is no evidence of pericardial effusion. Mitral Valve: The mitral valve is normal in structure. No evidence of mitral valve regurgitation. No evidence of mitral valve stenosis. Tricuspid Valve: The tricuspid valve is normal in structure. Tricuspid valve regurgitation is not demonstrated. No evidence of tricuspid stenosis. Aortic Valve: The aortic valve is tricuspid. Aortic valve regurgitation is not visualized. Aortic valve sclerosis/calcification is present, without any evidence of aortic stenosis. Pulmonic Valve: The pulmonic valve was normal in structure. Pulmonic valve regurgitation is trivial. No evidence of pulmonic stenosis. Aorta: The aortic root is normal in size and structure. Venous:  The inferior vena cava is dilated in size with greater than 50% respiratory variability,  suggesting right atrial pressure of 8 mmHg. IAS/Shunts: No atrial level shunt detected by color flow Doppler.  LEFT VENTRICLE PLAX 2D LVIDd:         4.50 cm     Diastology LVIDs:         3.30 cm     LV e' medial:    7.72 cm/s LV PW:         1.30 cm     LV E/e' medial:  7.7 LV IVS:        1.20 cm     LV e' lateral:   11.90 cm/s LVOT diam:     2.10 cm     LV E/e' lateral: 5.0 LV SV:         82 LV SV Index:   39 LVOT Area:     3.46 cm  LV Volumes (MOD) LV vol d, MOD A2C: 95.6 ml LV vol d, MOD A4C: 99.3 ml LV vol s, MOD A2C: 32.7 ml LV vol s, MOD A4C: 32.2 ml LV SV MOD A2C:     62.9 ml LV SV MOD A4C:     99.3 ml LV SV MOD BP:      65.5 ml RIGHT VENTRICLE             IVC RV Basal diam:  4.30 cm     IVC diam: 2.20 cm RV Mid diam:    3.90 cm RV S prime:     14.80 cm/s TAPSE (M-mode): 3.2 cm LEFT ATRIUM             Index        RIGHT ATRIUM           Index LA diam:        4.00 cm 1.89 cm/m   RA Area:     13.90 cm LA Vol (A2C):   47.6 ml 22.43 ml/m  RA Volume:   32.50 ml  15.32 ml/m LA Vol (A4C):   40.5 ml 19.09 ml/m LA Biplane Vol: 47.3 ml 22.29 ml/m  AORTIC VALVE LVOT Vmax:   129.00 cm/s LVOT Vmean:  85.000 cm/s LVOT VTI:    0.237 m  AORTA Ao Root diam: 3.30 cm Ao Asc diam:  4.00 cm MITRAL VALVE               TRICUSPID VALVE MV Area (PHT): 2.34 cm    TR Peak grad:   17.8 mmHg MV Decel Time: 324 msec    TR Vmax:        211.00 cm/s MV E velocity: 59.60 cm/s MV A velocity: 69.40 cm/s  SHUNTS MV E/A ratio:  0.86        Systemic VTI:  0.24 m                            Systemic Diam: 2.10 cm Armanda Magic MD Electronically signed by Armanda Magic MD Signature Date/Time: 08/17/2023/1:00:49 PM    Final    CT CHEST ABDOMEN PELVIS WO CONTRAST  Result Date: 08/16/2023 CLINICAL DATA:  Dry mouth, nausea, emesis. Chills. Lethargy. Squamous cell cancer on top of head. Lower extremity swelling. EXAM: CT CHEST, ABDOMEN AND PELVIS WITHOUT CONTRAST TECHNIQUE: Multidetector CT imaging of the chest, abdomen and pelvis was performed  following the standard protocol without IV contrast. RADIATION DOSE REDUCTION: This exam was performed according to the departmental  dose-optimization program which includes automated exposure control, adjustment of the mA and/or kV according to patient size and/or use of iterative reconstruction technique. COMPARISON:  Same day chest radiograph FINDINGS: CT CHEST FINDINGS Cardiovascular: Coronary artery and aortic atherosclerotic calcification. No pericardial effusion. Dilated ascending aorta measuring 41 mm in diameter. Mediastinum/Nodes: Trachea and esophagus are unremarkable. No thoracic adenopathy noting limitations of noncontrast exam. Lungs/Pleura: Bibasilar atelectasis/scarring. No pleural effusion or pneumothorax. Multiple bilateral pulmonary nodules. For example a left lower lobe subpleural nodule measures 8 x 4 mm on series 4/image 88. A left upper lobe perifissural nodule measures 5 x 7 mm on 4/54. 6 x 12 mm nodular thickening along or within the right major fissure (4/73). A right upper lobe perifissural nodule measures 5 x 6 mm on 4/41. Musculoskeletal: No acute fracture or destructive osseous lesion. CT ABDOMEN PELVIS FINDINGS Hepatobiliary: Unremarkable noncontrast appearance of the liver, gallbladder, biliary tree. Pancreas: Unremarkable. Spleen: Unremarkable. Adrenals/Urinary Tract: Contrast from previous CT neck within the ureters and bladder. No hydronephrosis. Unremarkable adrenal glands. Stomach/Bowel: Normal caliber large and small bowel. No bowel wall thickening. Stomach and appendix are within normal limits. Vascular/Lymphatic: No significant vascular findings are present. No enlarged abdominal or pelvic lymph nodes. Reproductive: No acute abnormality. Other: No free intraperitoneal fluid or air. Musculoskeletal: No acute fracture or destructive osseous lesion. IMPRESSION: 1. No acute abnormality in the chest, abdomen, or pelvis. 2. Multiple bilateral pulmonary nodules measuring up to 9 mm  (average dimension). Non-contrast chest CT at 3-6 months is recommended. If the nodules are stable at time of repeat CT, then future CT at 18-24 months (from today's scan) is considered optional for low-risk patients, but is recommended for high-risk patients. This recommendation follows the consensus statement: Guidelines for Management of Incidental Pulmonary Nodules Detected on CT Images: From the Fleischner Society 2017; Radiology 2017; 284:228-243. Aortic Atherosclerosis (ICD10-I70.0). Electronically Signed   By: Minerva Fester M.D.   On: 08/16/2023 20:43   CT Soft Tissue Neck W Contrast  Result Date: 08/16/2023 CLINICAL DATA:  Occult malignancy Left sided facial swelling, ? salivary tumor? No saliva production EXAM: CT NECK WITH CONTRAST TECHNIQUE: Multidetector CT imaging of the neck was performed using the standard protocol following the bolus administration of intravenous contrast. RADIATION DOSE REDUCTION: This exam was performed according to the departmental dose-optimization program which includes automated exposure control, adjustment of the mA and/or kV according to patient size and/or use of iterative reconstruction technique. CONTRAST:  75mL OMNIPAQUE IOHEXOL 300 MG/ML  SOLN COMPARISON:  CT of the neck 01/18/2023. FINDINGS: Pharynx and larynx: Normal. No mass or swelling. Streak artifact from dental amalgam limits assessment. Salivary glands: No visible mass lesion or inflammatory change. No stone. Thyroid: Subcentimeter right thyroid nodule which does not require further imaging follow-up (ref: J Am Coll Radiol. 2015 Feb;12(2): 143-50). Lymph nodes: None enlarged or abnormal density. Vascular: Negative. Limited intracranial: Negative. Visualized orbits: Negative. Mastoids and visualized paranasal sinuses: Mild inferior maxillary sinus mucosal thickening. Otherwise, clear. Skeleton: No evidence of acute abnormality on limited assessment. Upper chest: Visualized lung apices are clear. IMPRESSION:  No visible mass lesion or appreciable inflammatory change. If there is high clinical concern for malignancy, MRI face or neck with contrast could provide further evaluation. Electronically Signed   By: Feliberto Harts M.D.   On: 08/16/2023 18:41   DG Chest Portable 1 View  Result Date: 08/16/2023 CLINICAL DATA:  Weakness EXAM: PORTABLE CHEST 1 VIEW COMPARISON:  None Available. FINDINGS: The heart size and mediastinal contours are  within normal limits. Both lungs are clear. The visualized skeletal structures are unremarkable. IMPRESSION: No active disease. Electronically Signed   By: Layla Maw M.D.   On: 08/16/2023 17:38   US Abdomen Limited RUQ (LIVER/GB)  Result Date: 08/16/2023 CLINICAL DATA:  Elevated LFTs EXAM: ULTRASOUND ABDOMEN LIMITED RIGHT UPPER QUADRANT COMPARISON:  None Available. FINDINGS: Gallbladder: No gallstones or wall thickening visualized. No sonographic Murphy sign noted by sonographer. Common bile duct: Diameter: 6 mm Liver: Mildly increased echogenicity. No focal lesion. Portal vein is patent on color Doppler imaging with normal direction of blood flow towards the liver. Other: None. IMPRESSION: 1. No cholelithiasis or sonographic evidence for acute cholecystitis. 2. Mildly increased hepatic parenchymal echogenicity suggestive of steatosis. Electronically Signed   By: Annia Belt M.D.   On: 08/16/2023 16:38    Microbiology: Recent Results (from the past 240 hour(s))  SARS Coronavirus 2 by RT PCR (hospital order, performed in [facility] hospital lab) *cepheid single result test* Anterior Nasal Swab     Status: None   Collection Time: 08/16/23  3:17 PM   Specimen: Anterior Nasal Swab  Result Value Ref Range Status   SARS Coronavirus 2 by RT PCR NEGATIVE NEGATIVE Final    Comment: (NOTE) SARS-CoV-2 target nucleic acids are NOT DETECTED.  The SARS-CoV-2 RNA is generally detectable in upper and lower respiratory specimens during the acute phase of infection. The  lowest concentration of SARS-CoV-2 viral copies this assay can detect is 250 copies / mL. A negative result does not preclude SARS-CoV-2 infection and should not be used as the sole basis for treatment or other patient management decisions.  A negative result may occur with improper specimen collection / handling, submission of specimen other than nasopharyngeal swab, presence of viral mutation(s) within the areas targeted by this assay, and inadequate number of viral copies (<250 copies / mL). A negative result must be combined with clinical observations, patient history, and epidemiological information.  Fact Sheet for Patients:   RoadLapTop.co.za  Fact Sheet for Healthcare Providers: http://kim-miller.com/  This test is not yet approved or  cleared by the [place] FDA and has been authorized for detection and/or diagnosis of SARS-CoV-2 by FDA under an Emergency Use Authorization (EUA).  This EUA will remain in effect (meaning this test can be used) for the duration of the COVID-19 declaration under Section 564(b)(1) of the Act, 21 U.S.C. section 360bbb-3(b)(1), unless the authorization is terminated or revoked sooner.  Performed at [facility], [address], [place], [place] [address]   Culture, blood (Routine X 2) w Reflex to ID Panel     Status: None   Collection Time: 08/17/23  5:27 AM   Specimen: BLOOD  Result Value Ref Range Status   Specimen Description   Final    BLOOD BLOOD RIGHT ARM Performed at [facility], [address] [place], [place] [address]    Special Requests   Final    BOTTLES DRAWN AEROBIC AND ANAEROBIC Blood Culture adequate volume Performed at [facility], [address] [place], [place] [address]    Culture   Final    NO GROWTH 5 DAYS Performed at [facility], [address] [place], [place] [address]    Report Status  08/22/2023 FINAL  Final  Culture, blood (Routine X 2) w Reflex to ID Panel     Status: None   Collection Time: 08/17/23  5:27 AM   Specimen: BLOOD  Result Value Ref Range  Status   Specimen Description   Final    BLOOD BLOOD RIGHT ARM Performed at [facility], [address], [place], [place] [address]    Special Requests   Final    BOTTLES DRAWN AEROBIC AND ANAEROBIC Blood Culture adequate volume Performed at [facility], [address], [place], [place] [address]    Culture   Final    NO GROWTH 5 DAYS Performed at [facility], [address] [place], [place] [address]    Report Status 08/22/2023 FINAL  Final     Labs: Basic Metabolic Panel: Recent Labs  Lab 08/19/23 0525 08/19/23 1822 08/20/23 0807 08/21/23 0808 08/22/23 1649 08/23/23 0751 08/25/23 0530  NA 127*   < > 131* 130* 130* 132* 133*  K 4.0   < > 3.9 3.6 4.1 4.0 4.1  CL 99   < > 106 107 105 111 109  CO2 19*   < > 19* 18* 18* 17* 19*  GLUCOSE 94   < > 90 87 93 135* 107*  BUN 15   < > 13 10 13 16  29*  CREATININE 1.51*   < > 1.48* 1.24 1.35* 1.25* 1.26*  CALCIUM 7.9*   < > 7.4* 7.2* 7.8* 7.4* 8.1*  MG 1.7  --  1.6* 1.6* 1.7 1.8  --   PHOS 4.0  --  3.8 3.1 3.2 3.3  --    < > = values in this interval not displayed.   Liver Function Tests: Recent Labs  Lab 08/19/23 0525 08/20/23 0807 08/21/23 0808 08/22/23 1649 08/23/23 0751  AST 79* 71* 68* 67* 51*  ALT 57* 48* 46* 44 39  ALKPHOS 122 115 110 116 107  BILITOT 1.5* 1.3* 1.1 0.9 1.0  PROT 5.3* 5.1* 4.9* 5.2* 5.2*  ALBUMIN 2.7* 2.6* 2.4* 2.6* 2.5*   No results for input(s): "LIPASE", "AMYLASE" in the last 168 hours. No results for input(s): "AMMONIA" in the last 168 hours. CBC: Recent Labs  Lab 08/20/23 0807 08/21/23 0808 08/22/23 1649 08/23/23 0751 08/25/23 0530  WBC 6.7 6.2 7.5 4.4 9.7  NEUTROABS 2.5 2.4 2.9 3.6 7.8*  HGB 9.8* 8.9* 10.1* 9.4* 9.2*  HCT 28.6* 26.5* 29.8* 28.4* 28.1*  MCV 96.6  98.9 97.7 99.3 98.9  PLT 229 210 240 233 248   Cardiac Enzymes: No results for input(s): "CKTOTAL", "CKMB", "CKMBINDEX", "TROPONINI" in the last 168 hours. BNP: BNP (last 3 results) Recent Labs    08/16/23 1223 08/19/23 1246  BNP 84.5 130.9*    ProBNP (last 3 results) No results for input(s): "PROBNP" in the last 8760 hours.  CBG: No results for input(s): "GLUCAP" in the last 168 hours.     Signed:  Lewie Chamber MD.  [facility] 08/25/2023, 5:42 PM

## 2023-08-25 NOTE — Progress Notes (Signed)
Pt discharged home with all belongings. Pt's discharge medications delivered from outpt pharmacy and sent with pt as well. Discharge education completed with pt and pt's wife at bedside. No questions or concerns at this time.

## 2023-09-08 DIAGNOSIS — D649 Anemia, unspecified: Secondary | ICD-10-CM | POA: Diagnosis not present

## 2023-09-08 DIAGNOSIS — R748 Abnormal levels of other serum enzymes: Secondary | ICD-10-CM | POA: Diagnosis not present

## 2023-09-28 DIAGNOSIS — R918 Other nonspecific abnormal finding of lung field: Secondary | ICD-10-CM | POA: Diagnosis not present

## 2023-09-28 DIAGNOSIS — I700 Atherosclerosis of aorta: Secondary | ICD-10-CM | POA: Diagnosis not present

## 2023-09-28 DIAGNOSIS — C4492 Squamous cell carcinoma of skin, unspecified: Secondary | ICD-10-CM | POA: Diagnosis not present

## 2023-09-28 DIAGNOSIS — I77810 Thoracic aortic ectasia: Secondary | ICD-10-CM | POA: Diagnosis not present

## 2023-09-28 DIAGNOSIS — N1831 Chronic kidney disease, stage 3a: Secondary | ICD-10-CM | POA: Diagnosis not present

## 2023-09-28 DIAGNOSIS — B0229 Other postherpetic nervous system involvement: Secondary | ICD-10-CM | POA: Diagnosis not present

## 2023-10-05 DIAGNOSIS — T50905A Adverse effect of unspecified drugs, medicaments and biological substances, initial encounter: Secondary | ICD-10-CM | POA: Diagnosis not present

## 2023-10-05 DIAGNOSIS — Z79899 Other long term (current) drug therapy: Secondary | ICD-10-CM | POA: Diagnosis not present

## 2023-10-05 DIAGNOSIS — R718 Other abnormality of red blood cells: Secondary | ICD-10-CM | POA: Diagnosis not present

## 2023-10-05 DIAGNOSIS — D7212 Drug rash with eosinophilia and systemic symptoms syndrome: Secondary | ICD-10-CM | POA: Diagnosis not present

## 2023-10-05 DIAGNOSIS — C4492 Squamous cell carcinoma of skin, unspecified: Secondary | ICD-10-CM | POA: Diagnosis not present

## 2023-10-05 DIAGNOSIS — R918 Other nonspecific abnormal finding of lung field: Secondary | ICD-10-CM | POA: Diagnosis not present

## 2023-10-28 DIAGNOSIS — Z79899 Other long term (current) drug therapy: Secondary | ICD-10-CM | POA: Diagnosis not present

## 2023-10-28 DIAGNOSIS — R918 Other nonspecific abnormal finding of lung field: Secondary | ICD-10-CM | POA: Diagnosis not present

## 2023-10-28 DIAGNOSIS — T50905A Adverse effect of unspecified drugs, medicaments and biological substances, initial encounter: Secondary | ICD-10-CM | POA: Diagnosis not present

## 2023-10-28 DIAGNOSIS — R21 Rash and other nonspecific skin eruption: Secondary | ICD-10-CM | POA: Diagnosis not present

## 2023-10-28 DIAGNOSIS — M3500 Sicca syndrome, unspecified: Secondary | ICD-10-CM | POA: Diagnosis not present

## 2023-10-28 DIAGNOSIS — D7212 Drug rash with eosinophilia and systemic symptoms syndrome: Secondary | ICD-10-CM | POA: Diagnosis not present

## 2023-10-29 DIAGNOSIS — C4492 Squamous cell carcinoma of skin, unspecified: Secondary | ICD-10-CM | POA: Diagnosis not present

## 2023-11-04 DIAGNOSIS — Z5112 Encounter for antineoplastic immunotherapy: Secondary | ICD-10-CM | POA: Diagnosis not present

## 2023-11-04 DIAGNOSIS — R799 Abnormal finding of blood chemistry, unspecified: Secondary | ICD-10-CM | POA: Diagnosis not present

## 2023-11-04 DIAGNOSIS — D721 Eosinophilia, unspecified: Secondary | ICD-10-CM | POA: Diagnosis not present

## 2023-11-04 DIAGNOSIS — Z79899 Other long term (current) drug therapy: Secondary | ICD-10-CM | POA: Diagnosis not present

## 2023-11-04 DIAGNOSIS — M3500 Sicca syndrome, unspecified: Secondary | ICD-10-CM | POA: Diagnosis not present

## 2023-11-04 DIAGNOSIS — D649 Anemia, unspecified: Secondary | ICD-10-CM | POA: Diagnosis not present

## 2023-12-16 DIAGNOSIS — Z5112 Encounter for antineoplastic immunotherapy: Secondary | ICD-10-CM | POA: Diagnosis not present

## 2023-12-16 DIAGNOSIS — M3500 Sicca syndrome, unspecified: Secondary | ICD-10-CM | POA: Diagnosis not present

## 2024-01-10 DIAGNOSIS — B0229 Other postherpetic nervous system involvement: Secondary | ICD-10-CM | POA: Diagnosis not present

## 2024-01-10 DIAGNOSIS — N1831 Chronic kidney disease, stage 3a: Secondary | ICD-10-CM | POA: Diagnosis not present

## 2024-01-10 DIAGNOSIS — R918 Other nonspecific abnormal finding of lung field: Secondary | ICD-10-CM | POA: Diagnosis not present

## 2024-01-10 DIAGNOSIS — I77810 Thoracic aortic ectasia: Secondary | ICD-10-CM | POA: Diagnosis not present

## 2024-01-10 DIAGNOSIS — Z0000 Encounter for general adult medical examination without abnormal findings: Secondary | ICD-10-CM | POA: Diagnosis not present

## 2024-01-10 DIAGNOSIS — R17 Unspecified jaundice: Secondary | ICD-10-CM | POA: Diagnosis not present

## 2024-01-10 DIAGNOSIS — Z79899 Other long term (current) drug therapy: Secondary | ICD-10-CM | POA: Diagnosis not present

## 2024-01-10 DIAGNOSIS — C4492 Squamous cell carcinoma of skin, unspecified: Secondary | ICD-10-CM | POA: Diagnosis not present

## 2024-02-09 DIAGNOSIS — Z08 Encounter for follow-up examination after completed treatment for malignant neoplasm: Secondary | ICD-10-CM | POA: Diagnosis not present

## 2024-02-09 DIAGNOSIS — L570 Actinic keratosis: Secondary | ICD-10-CM | POA: Diagnosis not present

## 2024-02-09 DIAGNOSIS — L814 Other melanin hyperpigmentation: Secondary | ICD-10-CM | POA: Diagnosis not present

## 2024-02-09 DIAGNOSIS — C4442 Squamous cell carcinoma of skin of scalp and neck: Secondary | ICD-10-CM | POA: Diagnosis not present

## 2024-02-09 DIAGNOSIS — Z85828 Personal history of other malignant neoplasm of skin: Secondary | ICD-10-CM | POA: Diagnosis not present

## 2024-02-09 DIAGNOSIS — D0439 Carcinoma in situ of skin of other parts of face: Secondary | ICD-10-CM | POA: Diagnosis not present

## 2024-02-09 DIAGNOSIS — L821 Other seborrheic keratosis: Secondary | ICD-10-CM | POA: Diagnosis not present

## 2024-02-09 DIAGNOSIS — L578 Other skin changes due to chronic exposure to nonionizing radiation: Secondary | ICD-10-CM | POA: Diagnosis not present

## 2024-02-09 DIAGNOSIS — C44319 Basal cell carcinoma of skin of other parts of face: Secondary | ICD-10-CM | POA: Diagnosis not present

## 2024-02-09 DIAGNOSIS — R229 Localized swelling, mass and lump, unspecified: Secondary | ICD-10-CM | POA: Diagnosis not present

## 2024-02-09 DIAGNOSIS — D485 Neoplasm of uncertain behavior of skin: Secondary | ICD-10-CM | POA: Diagnosis not present

## 2024-02-09 DIAGNOSIS — C44619 Basal cell carcinoma of skin of left upper limb, including shoulder: Secondary | ICD-10-CM | POA: Diagnosis not present

## 2024-03-02 DIAGNOSIS — C44319 Basal cell carcinoma of skin of other parts of face: Secondary | ICD-10-CM | POA: Diagnosis not present

## 2024-03-16 DIAGNOSIS — Z5112 Encounter for antineoplastic immunotherapy: Secondary | ICD-10-CM | POA: Diagnosis not present

## 2024-03-16 DIAGNOSIS — D721 Eosinophilia, unspecified: Secondary | ICD-10-CM | POA: Diagnosis not present

## 2024-03-16 DIAGNOSIS — M3500 Sicca syndrome, unspecified: Secondary | ICD-10-CM | POA: Diagnosis not present

## 2024-03-23 DIAGNOSIS — C4492 Squamous cell carcinoma of skin, unspecified: Secondary | ICD-10-CM | POA: Diagnosis not present

## 2024-03-23 DIAGNOSIS — Z79899 Other long term (current) drug therapy: Secondary | ICD-10-CM | POA: Diagnosis not present

## 2024-03-24 DIAGNOSIS — H52203 Unspecified astigmatism, bilateral: Secondary | ICD-10-CM | POA: Diagnosis not present

## 2024-03-24 DIAGNOSIS — H1789 Other corneal scars and opacities: Secondary | ICD-10-CM | POA: Diagnosis not present

## 2024-03-24 DIAGNOSIS — H01005 Unspecified blepharitis left lower eyelid: Secondary | ICD-10-CM | POA: Diagnosis not present

## 2024-03-24 DIAGNOSIS — Z961 Presence of intraocular lens: Secondary | ICD-10-CM | POA: Diagnosis not present

## 2024-03-24 DIAGNOSIS — H2511 Age-related nuclear cataract, right eye: Secondary | ICD-10-CM | POA: Diagnosis not present

## 2024-03-30 DIAGNOSIS — D0439 Carcinoma in situ of skin of other parts of face: Secondary | ICD-10-CM | POA: Diagnosis not present

## 2024-04-06 ENCOUNTER — Ambulatory Visit: Admitting: Plastic Surgery

## 2024-04-06 ENCOUNTER — Encounter: Payer: Self-pay | Admitting: Plastic Surgery

## 2024-04-06 VITALS — BP 128/70 | HR 83 | Ht 74.0 in | Wt 185.4 lb

## 2024-04-06 DIAGNOSIS — Z85828 Personal history of other malignant neoplasm of skin: Secondary | ICD-10-CM | POA: Diagnosis not present

## 2024-04-06 DIAGNOSIS — S0100XD Unspecified open wound of scalp, subsequent encounter: Secondary | ICD-10-CM | POA: Diagnosis not present

## 2024-04-06 DIAGNOSIS — D044 Carcinoma in situ of skin of scalp and neck: Secondary | ICD-10-CM

## 2024-04-06 NOTE — Progress Notes (Signed)
 Sean Skinner returns today for 4 reevaluation.  He has been undergoing treatment for his cancer and [facility] and is now felt to be cancer free.  He still has a wound on his head that is not healed he is interested in whether anything can be done or not.  On examination he has exposed calvarium and a wound that is approximately 4 x 5 cm.  There really does not appear to be a significant amount of vascularized tissue in the base of the wound.  We discussed management at length.  He is not particularly interested in undergoing free tissue transfer at this time.  I have told him it is not unreasonable to try burring the bone 1 more time and see if we can get any type of growth.  I do believe that if this does not work that we should not try a second time.  He is in agreement.  All questions were answered to his satisfaction.  Photographs were obtained today with his consent.  Will submit him for bone burring and placement of myriad at his request.

## 2024-04-11 ENCOUNTER — Telehealth: Payer: Self-pay

## 2024-04-11 DIAGNOSIS — C44619 Basal cell carcinoma of skin of left upper limb, including shoulder: Secondary | ICD-10-CM | POA: Diagnosis not present

## 2024-04-11 NOTE — Telephone Encounter (Addendum)
 Faxed surgical clearance to patient's pcp, Charlene Conners, MD. Received fax success confirmation.

## 2024-05-04 NOTE — H&P (View-Only) (Signed)
 Patient ID: Sean Skinner, male    DOB: 16-Mar-1941, 83 y.o.   MRN: [id number]  Chief Complaint  Patient presents with   Pre-op Exam      ICD-10-CM   1. 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  male  with a history of SCC on scalp with radiation and subsequent open wound s/p debridement with biopsy of peripheral wound and burring of the calvarium with Integra placement 12/04/2022.  He presents for preoperative evaluation for upcoming procedure, debridement of the wound with burring of the calvarium and placement of tissue matrix, scheduled for 05/26/2024 with Dr. Carolynne Citron.  The patient has not had problems with anesthesia.  Previous surgery without complication or difficulty.  He does not smoke or use nicotine-containing products.  He denies any personal or family history of blood clots or clotting disorder.  Denies any personal history of cancer aside from his SCC/BCC, significant cardiac or pulmonary disease, or swollen legs.  He states that the pulmonary nodules resolved to repeat CT.  He is no longer taking Efudex for actinic keratosis.  He does not think that he has Sjogren's syndrome, but instead believes that the salivary gland issue was related to his cancer treatment.  He plans to follow-up with rheumatology shortly, but is currently not taking prednisone .  If he does restart, it will only be 5 mg daily maintenance.  He feels significantly improved from when the dry mouth first occurred and he was unable to eat/drink leading to a 9-day hospitalization.  Patient reports that his weight and energy levels are largely back at baseline.  He feels ready for surgery.  Discussed expectations and he expressed understanding.  Wife is at bedside.  Summary of Previous Visit: He was seen most recently here in clinic on 04/06/2024.  At that time, he had undergone treatment for his cancer at [facility] and was reportedly  cancer free, but with a persistent nonhealing scalp wound.  Wound measured approximately 4 x 5 cm.  No significant vascularized tissue in the base of the wound.  He was not interested in free tissue transfer.  Discussed another surgical attempt at burring the bone to encourage neovascularization and granulation with placement of tissue matrix.  Patient was agreeable to proceed.  PCP clearance needed.  PMH Significant for: SCC scalp with open wound, BCC forehead, Sjogren's syndrome on low-dose prednisone , actinic keratosis.  Surgical clearance received from PCP 04/26/2024.  Past Medical History: Allergies: No Known Allergies  Current Medications:  Current Outpatient Medications:    cyanocobalamin  (VITAMIN B12) 1000 MCG tablet, Take 1,000 mcg by mouth daily., Disp: , Rfl:    ondansetron  (ZOFRAN -ODT) 4 MG disintegrating tablet, Take 1 tablet (4 mg total) by mouth every 8 (eight) hours as needed for nausea or vomiting., Disp: 20 tablet, Rfl: 0   oxyCODONE  (ROXICODONE ) 5 MG immediate release tablet, Take 1 tablet (5 mg total) by mouth every 8 (eight) hours as needed for up to 5 doses for severe pain (pain score 7-10)., Disp: 5 tablet, Rfl: 0   valACYclovir  (VALTREX ) 1000 MG tablet, , Disp: , Rfl:    pantoprazole  (PROTONIX ) 40 MG tablet, Take 1 tablet (40 mg total) by mouth daily., Disp: 30 tablet, Rfl: 1  Past Medical Problems: Past Medical History:  Diagnosis Date   Basal cell cancer    face,arms,neck skin   Cystoid macular degeneration  of left eye    HZV (herpes zoster virus) post herpetic neuralgia     Past Surgical History: Past Surgical History:  Procedure Laterality Date   COLONOSCOPY WITH PROPOFOL  N/A 03/13/2013   Procedure: COLONOSCOPY WITH PROPOFOL ;  Surgeon: Sekou Zuckerman Kallman, MD;  Location: [facility];  Service: Endoscopy;  Laterality: N/A;   IRRIGATION AND DEBRIDEMENT OF WOUND WITH SPLIT THICKNESS SKIN GRAFT Right 12/04/2022   Procedure: Operative debridement and preparation of  5X5 cm scalp wound, Integra placement;  Surgeon: Teretha Ferguson, MD;  Location: [facility];  Service: Plastics;  Laterality: Right;   Removal of skin cancer      Social History: Social History   Socioeconomic History   Marital status: Married    Spouse name: Leary Provencal   Number of children: Not on file   Years of education: Not on file   Highest education level: Not on file  Occupational History   Not on file  Tobacco Use   Smoking status: Never   Smokeless tobacco: Former  Building services engineer status: Never Used  Substance and Sexual Activity   Alcohol use: Yes   Drug use: No   Sexual activity: Not on file  Other Topics Concern   Not on file  Social History Narrative   Not on file   Social Drivers of Health   Financial Resource Strain: Not on file  Food Insecurity: No Food Insecurity (03/23/2024)   Received from [facility]   Hunger Vital Sign    Worried About Running Out of Food in the Last Year: Never true    Ran Out of Food in the Last Year: Never true  Transportation Needs: No Transportation Needs (03/23/2024)   Received [facility]   PRAPARE - Transportation    Lack of Transportation (Medical): No    Lack of Transportation (Non-Medical): No  Physical Activity: Not on file  Stress: Not on file  Social Connections: Unknown (05/11/2022)   Received from [facility]   Social Network    Social Network: Not on file  Intimate Partner Violence: Patient Unable To Answer (03/23/2024)   Received from [facility]   Humiliation, Afraid, Rape, and Kick questionnaire    Fear of Current or Ex-Partner: Patient unable to answer    Emotionally Abused: Patient unable to answer    Physically Abused: Patient unable to answer    Sexually Abused: Patient unable to answer    Family History: No family history on file.  Review of Systems: ROS Denies any recent chest pain, difficulty breathing, leg swelling, fevers.  Physical  Exam: Vital Signs BP 116/76 (BP Location: Left Arm, Patient Position: Sitting, Cuff Size: Large)   Pulse 74   Ht 6\' 2"  (1.88 m)   Wt 190 lb 9.6 oz (86.5 kg)   SpO2 97%   BMI 24.47 kg/m   Physical Exam Constitutional:      General: Not in acute distress.    Appearance: Normal appearance. Not ill-appearing.  HENT:     Head: Normocephalic and atraumatic.  Eyes:     Pupils: Pupils are equal, round. Cardiovascular:     Rate and Rhythm: Normal rate.    Pulses: Normal pulses.  Pulmonary:     Effort: No respiratory distress or increased work of breathing.  Speaks in full sentences. Abdominal:     General: Abdomen is flat. No distension.   Musculoskeletal: Normal range of motion. No lower extremity swelling or edema. Skin:  General: Skin is warm and dry.     Findings: No erythema or rash.  Neurological:     Mental Status: Alert and oriented to person, place, and time.  Psychiatric:        Mood and Affect: Mood normal.        Behavior: Behavior normal.    Assessment/Plan: The patient is scheduled for debridement of the wound with burring of the calvarium and placement of tissue matrix with Dr. Carolynne Citron.  Risks, benefits, and alternatives of procedure discussed, questions answered and consent obtained.    Smoking Status: Non-smoker.  Caprini Score: 5; Risk Factors include: Age, scattered varicosities, and length of planned surgery. Recommendation for mechanical prophylaxis. Encourage early ambulation.   Pictures obtained: 04/06/2024  Post-op Rx sent to pharmacy: Oxycodone  (#5), Zofran .  Patient was provided with the General Surgical Risk consent document and Pain Medication Agreement prior to their appointment.  They had adequate time to read through the risk consent documents and Pain Medication Agreement. We also discussed them in person together during this preop appointment. All of their questions were answered to their satisfaction.  Recommended calling if they have any  further questions.  Risk consent form and Pain Medication Agreement to be scanned into patient's chart.    Electronically signed by: Mariel Shope, PA-C 05/08/2024 12:06 PM

## 2024-05-04 NOTE — Progress Notes (Signed)
 Patient ID: Sean Skinner, male    DOB: 16-Mar-1941, 83 y.o.   MRN: [id number]  Chief Complaint  Patient presents with   Pre-op Exam      ICD-10-CM   1. 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  male  with a history of SCC on scalp with radiation and subsequent open wound s/p debridement with biopsy of peripheral wound and burring of the calvarium with Integra placement 12/04/2022.  He presents for preoperative evaluation for upcoming procedure, debridement of the wound with burring of the calvarium and placement of tissue matrix, scheduled for 05/26/2024 with Dr. Carolynne Citron.  The patient has not had problems with anesthesia.  Previous surgery without complication or difficulty.  He does not smoke or use nicotine-containing products.  He denies any personal or family history of blood clots or clotting disorder.  Denies any personal history of cancer aside from his SCC/BCC, significant cardiac or pulmonary disease, or swollen legs.  He states that the pulmonary nodules resolved to repeat CT.  He is no longer taking Efudex for actinic keratosis.  He does not think that he has Sjogren's syndrome, but instead believes that the salivary gland issue was related to his cancer treatment.  He plans to follow-up with rheumatology shortly, but is currently not taking prednisone .  If he does restart, it will only be 5 mg daily maintenance.  He feels significantly improved from when the dry mouth first occurred and he was unable to eat/drink leading to a 9-day hospitalization.  Patient reports that his weight and energy levels are largely back at baseline.  He feels ready for surgery.  Discussed expectations and he expressed understanding.  Wife is at bedside.  Summary of Previous Visit: He was seen most recently here in clinic on 04/06/2024.  At that time, he had undergone treatment for his cancer at [facility] and was reportedly  cancer free, but with a persistent nonhealing scalp wound.  Wound measured approximately 4 x 5 cm.  No significant vascularized tissue in the base of the wound.  He was not interested in free tissue transfer.  Discussed another surgical attempt at burring the bone to encourage neovascularization and granulation with placement of tissue matrix.  Patient was agreeable to proceed.  PCP clearance needed.  PMH Significant for: SCC scalp with open wound, BCC forehead, Sjogren's syndrome on low-dose prednisone , actinic keratosis.  Surgical clearance received from PCP 04/26/2024.  Past Medical History: Allergies: No Known Allergies  Current Medications:  Current Outpatient Medications:    cyanocobalamin  (VITAMIN B12) 1000 MCG tablet, Take 1,000 mcg by mouth daily., Disp: , Rfl:    ondansetron  (ZOFRAN -ODT) 4 MG disintegrating tablet, Take 1 tablet (4 mg total) by mouth every 8 (eight) hours as needed for nausea or vomiting., Disp: 20 tablet, Rfl: 0   oxyCODONE  (ROXICODONE ) 5 MG immediate release tablet, Take 1 tablet (5 mg total) by mouth every 8 (eight) hours as needed for up to 5 doses for severe pain (pain score 7-10)., Disp: 5 tablet, Rfl: 0   valACYclovir  (VALTREX ) 1000 MG tablet, , Disp: , Rfl:    pantoprazole  (PROTONIX ) 40 MG tablet, Take 1 tablet (40 mg total) by mouth daily., Disp: 30 tablet, Rfl: 1  Past Medical Problems: Past Medical History:  Diagnosis Date   Basal cell cancer    face,arms,neck skin   Cystoid macular degeneration  of left eye    HZV (herpes zoster virus) post herpetic neuralgia     Past Surgical History: Past Surgical History:  Procedure Laterality Date   COLONOSCOPY WITH PROPOFOL  N/A 03/13/2013   Procedure: COLONOSCOPY WITH PROPOFOL ;  Surgeon: Sekou Zuckerman Kallman, MD;  Location: [facility];  Service: Endoscopy;  Laterality: N/A;   IRRIGATION AND DEBRIDEMENT OF WOUND WITH SPLIT THICKNESS SKIN GRAFT Right 12/04/2022   Procedure: Operative debridement and preparation of  5X5 cm scalp wound, Integra placement;  Surgeon: Teretha Ferguson, MD;  Location: [facility];  Service: Plastics;  Laterality: Right;   Removal of skin cancer      Social History: Social History   Socioeconomic History   Marital status: Married    Spouse name: Leary Provencal   Number of children: Not on file   Years of education: Not on file   Highest education level: Not on file  Occupational History   Not on file  Tobacco Use   Smoking status: Never   Smokeless tobacco: Former  Building services engineer status: Never Used  Substance and Sexual Activity   Alcohol use: Yes   Drug use: No   Sexual activity: Not on file  Other Topics Concern   Not on file  Social History Narrative   Not on file   Social Drivers of Health   Financial Resource Strain: Not on file  Food Insecurity: No Food Insecurity (03/23/2024)   Received from [facility]   Hunger Vital Sign    Worried About Running Out of Food in the Last Year: Never true    Ran Out of Food in the Last Year: Never true  Transportation Needs: No Transportation Needs (03/23/2024)   Received from [facility]   PRAPARE - Transportation    Lack of Transportation (Medical): No    Lack of Transportation (Non-Medical): No  Physical Activity: Not on file  Stress: Not on file  Social Connections: Unknown (05/11/2022)   Received from [facility]   Social Network    Social Network: Not on file  Intimate Partner Violence: Patient Unable To Answer (03/23/2024)   Received from [facility]   Humiliation, Afraid, Rape, and Kick questionnaire    Fear of Current or Ex-Partner: Patient unable to answer    Emotionally Abused: Patient unable to answer    Physically Abused: Patient unable to answer    Sexually Abused: Patient unable to answer    Family History: No family history on file.  Review of Systems: ROS Denies any recent chest pain, difficulty breathing, leg swelling, fevers.  Physical  Exam: Vital Signs BP 116/76 (BP Location: Left Arm, Patient Position: Sitting, Cuff Size: Large)   Pulse 74   Ht 6\' 2"  (1.88 m)   Wt 190 lb 9.6 oz (86.5 kg)   SpO2 97%   BMI 24.47 kg/m   Physical Exam Constitutional:      General: Not in acute distress.    Appearance: Normal appearance. Not ill-appearing.  HENT:     Head: Normocephalic and atraumatic.  Eyes:     Pupils: Pupils are equal, round. Cardiovascular:     Rate and Rhythm: Normal rate.    Pulses: Normal pulses.  Pulmonary:     Effort: No respiratory distress or increased work of breathing.  Speaks in full sentences. Abdominal:     General: Abdomen is flat. No distension.   Musculoskeletal: Normal range of motion. No lower extremity swelling or edema. Skin:  General: Skin is warm and dry.     Findings: No erythema or rash.  Neurological:     Mental Status: Alert and oriented to person, place, and time.  Psychiatric:        Mood and Affect: Mood normal.        Behavior: Behavior normal.    Assessment/Plan: The patient is scheduled for debridement of the wound with burring of the calvarium and placement of tissue matrix with Dr. Carolynne Citron.  Risks, benefits, and alternatives of procedure discussed, questions answered and consent obtained.    Smoking Status: Non-smoker.  Caprini Score: 5; Risk Factors include: Age, scattered varicosities, and length of planned surgery. Recommendation for mechanical prophylaxis. Encourage early ambulation.   Pictures obtained: 04/06/2024  Post-op Rx sent to pharmacy: Oxycodone  (#5), Zofran .  Patient was provided with the General Surgical Risk consent document and Pain Medication Agreement prior to their appointment.  They had adequate time to read through the risk consent documents and Pain Medication Agreement. We also discussed them in person together during this preop appointment. All of their questions were answered to their satisfaction.  Recommended calling if they have any  further questions.  Risk consent form and Pain Medication Agreement to be scanned into patient's chart.    Electronically signed by: Mariel Shope, PA-C 05/08/2024 12:06 PM

## 2024-05-08 ENCOUNTER — Ambulatory Visit (INDEPENDENT_AMBULATORY_CARE_PROVIDER_SITE_OTHER): Admitting: Physician Assistant

## 2024-05-08 ENCOUNTER — Encounter: Payer: Self-pay | Admitting: Physician Assistant

## 2024-05-08 VITALS — BP 116/76 | HR 74 | Ht 74.0 in | Wt 190.6 lb

## 2024-05-08 DIAGNOSIS — D044 Carcinoma in situ of skin of scalp and neck: Secondary | ICD-10-CM

## 2024-05-08 MED ORDER — OXYCODONE HCL 5 MG PO TABS: 5.0000 mg | ORAL_TABLET | Freq: Three times a day (TID) | ORAL | 0 refills | Status: DC | PRN

## 2024-05-08 MED ORDER — ONDANSETRON 4 MG PO TBDP: 4.0000 mg | ORAL_TABLET | Freq: Three times a day (TID) | ORAL | 0 refills | Status: DC | PRN

## 2024-05-11 DIAGNOSIS — Z5112 Encounter for antineoplastic immunotherapy: Secondary | ICD-10-CM | POA: Diagnosis not present

## 2024-05-11 DIAGNOSIS — E559 Vitamin D deficiency, unspecified: Secondary | ICD-10-CM | POA: Diagnosis not present

## 2024-05-11 DIAGNOSIS — M3500 Sicca syndrome, unspecified: Secondary | ICD-10-CM | POA: Diagnosis not present

## 2024-05-18 ENCOUNTER — Encounter (HOSPITAL_BASED_OUTPATIENT_CLINIC_OR_DEPARTMENT_OTHER): Payer: Self-pay | Admitting: Plastic Surgery

## 2024-05-18 ENCOUNTER — Other Ambulatory Visit: Payer: Self-pay

## 2024-05-18 NOTE — Progress Notes (Signed)
   05/18/24 1413  PAT Phone Screen  Is the patient taking a GLP-1 receptor agonist? No  Do You Have Diabetes? No  Do You Have Hypertension? No  Have You Ever Been to the ER for Asthma? No  Have You Taken Oral Steroids in the Past 3 Months? No  Do you Take Phenteramine or any Other Diet Drugs? No  Recent  Lab Work, EKG, CXR? (S)  Yes (EKG, ECHO)  Where was this test performed? [facility]  Do you have a history of heart problems? No  Any Recent Hospitalizations? (S)  No (pt was admitted in August 2024, fully recovered. No further admissions)  Height 6\' 2"  (1.88 m)  Weight 86.5 kg  Pat Appointment Scheduled Yes ([facility])

## 2024-05-18 NOTE — Progress Notes (Signed)
 Reviewed chart with Dr. Glenetta Lane regarding most recent CBC. Repeat CBC not needed and pt okay for [facility] per Dr. Glenetta Lane

## 2024-05-26 ENCOUNTER — Encounter (HOSPITAL_BASED_OUTPATIENT_CLINIC_OR_DEPARTMENT_OTHER)
Admission: RE | Disposition: A | Discharge status: Home / Self Care | Payer: Self-pay | Source: Home / Self Care | Attending: Plastic Surgery

## 2024-05-26 ENCOUNTER — Ambulatory Visit (HOSPITAL_BASED_OUTPATIENT_CLINIC_OR_DEPARTMENT_OTHER): Payer: Self-pay | Admitting: Anesthesiology

## 2024-05-26 ENCOUNTER — Encounter (HOSPITAL_BASED_OUTPATIENT_CLINIC_OR_DEPARTMENT_OTHER): Payer: Self-pay | Admitting: Plastic Surgery

## 2024-05-26 ENCOUNTER — Ambulatory Visit (HOSPITAL_BASED_OUTPATIENT_CLINIC_OR_DEPARTMENT_OTHER)
Admission: RE | Admit: 2024-05-26 | Discharge: 2024-05-26 | Disposition: A | Discharge status: Home / Self Care | Attending: Plastic Surgery | Admitting: Plastic Surgery

## 2024-05-26 ENCOUNTER — Other Ambulatory Visit: Payer: Self-pay

## 2024-05-26 DIAGNOSIS — X58XXXA Exposure to other specified factors, initial encounter: Secondary | ICD-10-CM | POA: Diagnosis not present

## 2024-05-26 DIAGNOSIS — S0100XD Unspecified open wound of scalp, subsequent encounter: Secondary | ICD-10-CM

## 2024-05-26 DIAGNOSIS — S0100XA Unspecified open wound of scalp, initial encounter: Secondary | ICD-10-CM | POA: Insufficient documentation

## 2024-05-26 DIAGNOSIS — M3500 Sicca syndrome, unspecified: Secondary | ICD-10-CM | POA: Diagnosis not present

## 2024-05-26 DIAGNOSIS — D044 Carcinoma in situ of skin of scalp and neck: Secondary | ICD-10-CM | POA: Insufficient documentation

## 2024-05-26 DIAGNOSIS — Z79899 Other long term (current) drug therapy: Secondary | ICD-10-CM | POA: Diagnosis not present

## 2024-05-26 HISTORY — PX: SCAR REVISION: SHX5285

## 2024-05-26 HISTORY — PX: APPLICATION, SKIN SUBSTITUTE: SHX7530

## 2024-05-26 HISTORY — DX: Sjogren syndrome, unspecified: M35.00

## 2024-05-26 SURGERY — REVISION, SCAR
Anesthesia: General | Site: Scalp | Laterality: Right

## 2024-05-26 MED ORDER — ONDANSETRON HCL 4 MG/2ML IJ SOLN
INTRAMUSCULAR | Status: DC | PRN
  Administered 2024-05-26: 4 mg via INTRAVENOUS

## 2024-05-26 MED ORDER — FENTANYL CITRATE (PF) 100 MCG/2ML IJ SOLN
INTRAMUSCULAR | Status: DC | PRN
  Administered 2024-05-26: 75 ug via INTRAVENOUS
  Administered 2024-05-26: 25 ug via INTRAVENOUS

## 2024-05-26 MED ORDER — PROPOFOL 10 MG/ML IV BOLUS
INTRAVENOUS | Status: DC | PRN
Start: 2024-05-26 — End: 2024-05-26
  Administered 2024-05-26 (×2): 100 mg via INTRAVENOUS

## 2024-05-26 MED ORDER — ONDANSETRON HCL 4 MG/2ML IJ SOLN
INTRAMUSCULAR | Status: AC
  Filled 2024-05-26: qty 2

## 2024-05-26 MED ORDER — BUPIVACAINE-EPINEPHRINE 0.25% -1:200000 IJ SOLN
INTRAMUSCULAR | Status: DC | PRN
  Administered 2024-05-26: 3 mL

## 2024-05-26 MED ORDER — CEFAZOLIN SODIUM-DEXTROSE 2-4 GM/100ML-% IV SOLN
2.0000 g | INTRAVENOUS | Status: AC
  Administered 2024-05-26: 2 g via INTRAVENOUS

## 2024-05-26 MED ORDER — 0.9 % SODIUM CHLORIDE (POUR BTL) OPTIME
TOPICAL | Status: DC | PRN
  Administered 2024-05-26 (×2): 1000 mL

## 2024-05-26 MED ORDER — LIDOCAINE 2% (20 MG/ML) 5 ML SYRINGE
INTRAMUSCULAR | Status: AC
  Filled 2024-05-26: qty 5

## 2024-05-26 MED ORDER — LIDOCAINE HCL (CARDIAC) PF 100 MG/5ML IV SOSY
PREFILLED_SYRINGE | INTRAVENOUS | Status: DC | PRN
Start: 2024-05-26 — End: 2024-05-26
  Administered 2024-05-26: 20 mg via INTRAVENOUS

## 2024-05-26 MED ORDER — PHENYLEPHRINE HCL-NACL 20-0.9 MG/250ML-% IV SOLN
INTRAVENOUS | Status: DC | PRN
  Administered 2024-05-26: 40 ug/min via INTRAVENOUS

## 2024-05-26 MED ORDER — LACTATED RINGERS IV SOLN: INTRAVENOUS | Status: DC

## 2024-05-26 MED ORDER — ATROPINE SULFATE 0.4 MG/ML IV SOLN
INTRAVENOUS | Status: AC
  Filled 2024-05-26: qty 1

## 2024-05-26 MED ORDER — EPHEDRINE SULFATE (PRESSORS) 50 MG/ML IJ SOLN
INTRAMUSCULAR | Status: DC | PRN
  Administered 2024-05-26: 10 mg via INTRAVENOUS

## 2024-05-26 MED ORDER — PROPOFOL 500 MG/50ML IV EMUL
INTRAVENOUS | Status: DC | PRN
Start: 2024-05-26 — End: 2024-05-26
  Administered 2024-05-26: 125 ug/kg/min via INTRAVENOUS

## 2024-05-26 MED ORDER — DEXAMETHASONE SODIUM PHOSPHATE 10 MG/ML IJ SOLN
INTRAMUSCULAR | Status: AC
  Filled 2024-05-26: qty 1

## 2024-05-26 MED ORDER — CEFAZOLIN SODIUM-DEXTROSE 2-4 GM/100ML-% IV SOLN
INTRAVENOUS | Status: AC
  Filled 2024-05-26: qty 100

## 2024-05-26 MED ORDER — CHLORHEXIDINE GLUCONATE CLOTH 2 % EX PADS: 6.0000 | MEDICATED_PAD | Freq: Once | CUTANEOUS | Status: DC

## 2024-05-26 MED ORDER — ACETAMINOPHEN 500 MG PO TABS
ORAL_TABLET | ORAL | Status: AC
  Filled 2024-05-26: qty 2

## 2024-05-26 MED ORDER — FENTANYL CITRATE (PF) 100 MCG/2ML IJ SOLN: 25.0000 ug | INTRAMUSCULAR | Status: DC | PRN

## 2024-05-26 MED ORDER — PHENYLEPHRINE 80 MCG/ML (10ML) SYRINGE FOR IV PUSH (FOR BLOOD PRESSURE SUPPORT)
PREFILLED_SYRINGE | INTRAVENOUS | Status: AC
  Filled 2024-05-26: qty 10

## 2024-05-26 MED ORDER — VASHE WOUND IRRIGATION OPTIME
TOPICAL | Status: DC | PRN
  Administered 2024-05-26: 34 [oz_av]

## 2024-05-26 MED ORDER — ACETAMINOPHEN 500 MG PO TABS
1000.0000 mg | ORAL_TABLET | Freq: Once | ORAL | Status: AC
  Administered 2024-05-26: 1000 mg via ORAL

## 2024-05-26 MED ORDER — EPHEDRINE 5 MG/ML INJ
INTRAVENOUS | Status: AC
  Filled 2024-05-26: qty 5

## 2024-05-26 MED ORDER — FENTANYL CITRATE (PF) 100 MCG/2ML IJ SOLN
INTRAMUSCULAR | Status: AC
  Filled 2024-05-26: qty 2

## 2024-05-26 MED ORDER — PHENYLEPHRINE HCL (PRESSORS) 10 MG/ML IV SOLN
INTRAVENOUS | Status: DC | PRN
  Administered 2024-05-26: 80 ug via INTRAVENOUS

## 2024-05-26 MED ORDER — SUCCINYLCHOLINE CHLORIDE 200 MG/10ML IV SOSY
PREFILLED_SYRINGE | INTRAVENOUS | Status: AC
  Filled 2024-05-26: qty 10

## 2024-05-26 SURGICAL SUPPLY — 76 items
BLADE CLIPPER SURG (BLADE) IMPLANT
BLADE SURG 15 STRL LF DISP TIS (BLADE) ×2 IMPLANT
BNDG ELASTIC 2INX 5YD STR LF (GAUZE/BANDAGES/DRESSINGS) IMPLANT
BNDG GAUZE DERMACEA FLUFF 4 (GAUZE/BANDAGES/DRESSINGS) IMPLANT
BUR RND DIAMOND ELITE 4.0 (BURR) IMPLANT
CANISTER SUCT 1200ML W/VALVE (MISCELLANEOUS) IMPLANT
CLEANSER WND VASHE 34 (WOUND CARE) IMPLANT
CLSR STERI-STRIP ANTIMIC 1/2X4 (GAUZE/BANDAGES/DRESSINGS) IMPLANT
CORD BIPOLAR FORCEPS 12FT (ELECTRODE) IMPLANT
COVER BACK TABLE 60X90IN (DRAPES) ×2 IMPLANT
COVER MAYO STAND STRL (DRAPES) ×2 IMPLANT
DERMABOND ADVANCED .7 DNX12 (GAUZE/BANDAGES/DRESSINGS) IMPLANT
DRAPE LAPAROTOMY T 102X78X121 (DRAPES) IMPLANT
DRAPE U-SHAPE 76X120 STRL (DRAPES) IMPLANT
DRSG ADAPTIC 3X8 NADH LF (GAUZE/BANDAGES/DRESSINGS) IMPLANT
DRSG CUTIMED SORBACT 7X9 (GAUZE/BANDAGES/DRESSINGS) IMPLANT
DRSG EMULSION OIL 3X3 NADH (GAUZE/BANDAGES/DRESSINGS) IMPLANT
ELECT COATED BLADE 2.86 ST (ELECTRODE) IMPLANT
ELECT NDL BLADE 2-5/6 (NEEDLE) IMPLANT
ELECT NEEDLE BLADE 2-5/6 (NEEDLE) IMPLANT
ELECTRODE REM PT RETRN 9FT PED (ELECTROSURGICAL) IMPLANT
ELECTRODE REM PT RTRN 9FT ADLT (ELECTROSURGICAL) IMPLANT
GAUZE PAD ABD 8X10 STRL (GAUZE/BANDAGES/DRESSINGS) IMPLANT
GAUZE SPONGE 2X2 STRL 8-PLY (GAUZE/BANDAGES/DRESSINGS) IMPLANT
GAUZE SPONGE 4X4 12PLY STRL LF (GAUZE/BANDAGES/DRESSINGS) IMPLANT
GAUZE STRETCH 2X75IN STRL (MISCELLANEOUS) IMPLANT
GAUZE XEROFORM 1X8 LF (GAUZE/BANDAGES/DRESSINGS) IMPLANT
GAUZE XEROFORM 5X9 LF (GAUZE/BANDAGES/DRESSINGS) IMPLANT
GLOVE BIO SURGEON STRL SZ8 (GLOVE) ×2 IMPLANT
GLOVE BIOGEL M STRL SZ7.5 (GLOVE) ×2 IMPLANT
GLOVE BIOGEL PI IND STRL 7.5 (GLOVE) ×2 IMPLANT
GOWN STRL REUS W/ TWL LRG LVL3 (GOWN DISPOSABLE) ×4 IMPLANT
GRAFT MYRIAD 3 LAYER 5X5 (Graft) IMPLANT
HIBICLENS CHG 4% 4OZ BTL (MISCELLANEOUS) ×2 IMPLANT
NDL HYPO 30GX1 BEV (NEEDLE) IMPLANT
NDL PRECISIONGLIDE 27X1.5 (NEEDLE) IMPLANT
NDL SPNL 18GX3.5 QUINCKE PK (NEEDLE) IMPLANT
NEEDLE HYPO 30GX1 BEV (NEEDLE) IMPLANT
NEEDLE PRECISIONGLIDE 27X1.5 (NEEDLE) ×2 IMPLANT
NEEDLE SPNL 18GX3.5 QUINCKE PK (NEEDLE) IMPLANT
NS IRRIG 1000ML POUR BTL (IV SOLUTION) IMPLANT
PACK BASIN DAY SURGERY FS (CUSTOM PROCEDURE TRAY) ×2 IMPLANT
PENCIL SMOKE EVACUATOR (MISCELLANEOUS) ×2 IMPLANT
SHEET MEDIUM DRAPE 40X70 STRL (DRAPES) IMPLANT
SLEEVE SCD COMPRESS KNEE MED (STOCKING) IMPLANT
SOL PREP POV-IOD 4OZ 10% (MISCELLANEOUS) IMPLANT
SPONGE T-LAP 18X18 ~~LOC~~+RFID (SPONGE) IMPLANT
STAPLER SKIN PROX 35W (STAPLE) IMPLANT
STAPLER SKIN PROX WIDE 3.9 (STAPLE) IMPLANT
STRIP CLOSURE SKIN 1/2X4 (GAUZE/BANDAGES/DRESSINGS) IMPLANT
STRIP SUTURE WOUND CLOSURE 1/2 (MISCELLANEOUS) IMPLANT
SUCTION TUBE FRAZIER 10FR DISP (SUCTIONS) IMPLANT
SURGILUBE 2OZ TUBE FLIPTOP (MISCELLANEOUS) IMPLANT
SUT CHROMIC 4 0 P 3 18 (SUTURE) IMPLANT
SUT CHROMIC 5 0 P 3 (SUTURE) IMPLANT
SUT ETHILON 4 0 P 3 18 (SUTURE) IMPLANT
SUT ETHILON 4 0 PS 2 18 (SUTURE) IMPLANT
SUT MNCRL 6-0 UNDY P1 1X18 (SUTURE) IMPLANT
SUT MNCRL AB 3-0 PS2 18 (SUTURE) IMPLANT
SUT MNCRL AB 4-0 PS2 18 (SUTURE) IMPLANT
SUT MON AB 5-0 P3 18 (SUTURE) IMPLANT
SUT NYLON ETHILON 5-0 P-3 1X18 (SUTURE) IMPLANT
SUT PDS II 3-0 CT2 27 ABS (SUTURE) IMPLANT
SUT PROLENE 4 0 PS 2 18 (SUTURE) IMPLANT
SUT PROLENE 5 0 P 3 (SUTURE) IMPLANT
SUT VIC AB 3-0 SH 27X BRD (SUTURE) IMPLANT
SUT VIC AB 4-0 PS2 18 (SUTURE) IMPLANT
SUT VIC AB 5-0 P-3 18X BRD (SUTURE) IMPLANT
SUT VICRYL RAPIDE 4-0 (SUTURE) IMPLANT
SYR 10ML LL (SYRINGE) IMPLANT
SYR BULB EAR ULCER 3OZ GRN STR (SYRINGE) IMPLANT
SYR CONTROL 10ML LL (SYRINGE) ×2 IMPLANT
TOWEL GREEN STERILE FF (TOWEL DISPOSABLE) ×2 IMPLANT
TRAY DSU PREP LF (CUSTOM PROCEDURE TRAY) IMPLANT
TUBE CONNECTING 20X1/4 (TUBING) IMPLANT
YANKAUER SUCT BULB TIP NO VENT (SUCTIONS) IMPLANT

## 2024-05-26 NOTE — Anesthesia Preprocedure Evaluation (Addendum)
 Anesthesia Evaluation  Patient identified by MRN, date of birth, ID band Patient awake    Reviewed: Allergy & Precautions, H&P , NPO status , Patient's Chart, lab work & pertinent test results  Airway Mallampati: I  TM Distance: >3 FB Neck ROM: Full    Dental no notable dental hx. (+) Teeth Intact, Dental Advisory Given   Pulmonary neg pulmonary ROS   Pulmonary exam normal breath sounds clear to auscultation       Cardiovascular negative cardio ROS  Rhythm:Regular Rate:Normal     Neuro/Psych negative neurological ROS  negative psych ROS   GI/Hepatic negative GI ROS, Neg liver ROS,,,  Endo/Other  negative endocrine ROS    Renal/GU Renal InsufficiencyRenal disease  negative genitourinary   Musculoskeletal   Abdominal   Peds  Hematology negative hematology ROS (+)   Anesthesia Other Findings   Reproductive/Obstetrics negative OB ROS                             Anesthesia Physical Anesthesia Plan  ASA: 2  Anesthesia Plan: General   Post-op Pain Management: Tylenol  PO (pre-op)*   Induction: Intravenous  PONV Risk Score and Plan: 3 and Ondansetron , Propofol  infusion, TIVA and Dexamethasone   Airway Management Planned: LMA  Additional Equipment:   Intra-op Plan:   Post-operative Plan: Extubation in OR  Informed Consent: I have reviewed the patients History and Physical, chart, labs and discussed the procedure including the risks, benefits and alternatives for the proposed anesthesia with the patient or authorized representative who has indicated his/her understanding and acceptance.     Dental advisory given  Plan Discussed with: CRNA  Anesthesia Plan Comments:        Anesthesia Quick Evaluation

## 2024-05-26 NOTE — Discharge Instructions (Addendum)
 INSTRUCTIONS FOR AFTER SURGERY   You will likely have some questions about what to expect following your operation.  The following information will help you and your family understand what to expect when you are discharged from the hospital.  It is important to follow these guidelines to help ensure a smooth recovery and reduce complication.  Postoperative instructions include information on: diet, wound care, medications and physical activity.  AFTER SURGERY Expect to go home after the procedure.  In some cases, you may need to spend one night in the hospital for observation.  DIET The healthier you eat the better your body will heal. It is important to increasing your protein intake.  This means limiting the foods with sugar and carbohydrates.  Focus on vegetables and some meat.  If you have liposuction during your procedure be sure to drink water.  If your urine is bright yellow, then it is concentrated, and you need to drink more water.  As a general rule after surgery, you should have 8 ounces of water every hour while awake.  If you find you are persistently nauseated or unable to take in liquids let us  know.  NO TOBACCO USE or EXPOSURE.  This will slow your healing process and lead to a wound.  WOUND CARE Do NOT get the surgical site wet. Do not submerge the incision in bath, pool, tub, etc.  Leave dressings in place until Sunday, 05/28/24. On Sunday, you may remove tan dressing and gauze. LEAVE GREEN MESH DRESSING IN PLACE. Apply KY jelly to the green dressing, followed by Vashe-soaked gauze and a dressing over the top. Change dressing as described above daily.  No baths, pools or hot tubs for four weeks. We close your incision to leave the smallest and best-looking scar. No ointment or creams on your incisions for four weeks.  No Neosporin (Too many skin reactions).  A few weeks after surgery you can use Mederma and start massaging the scar.  ACTIVITY No heavy lifting until cleared by the  doctor.  It is OK to walk and climb stairs. Moving your legs is very important to decrease your risk of a blood clot.  It will also help keep you from getting deconditioned.  Every 1 to 2 hours get up and walk for 5 minutes. This will help with a quicker recovery back to normal.  Let pain be your guide so you don't do too much.  This time is for you to recover.  You will be more comfortable if you sleep and rest with your head elevated either with a few pillows under you or in a recliner.  No stomach sleeping for a three months.   DRIVING Arrange for someone to bring you home from the hospital after your surgery.  You may be able to drive a few days after surgery but not while taking any narcotics or valium.  BOWEL MOVEMENTS Constipation can occur after anesthesia and while taking pain medication.  It is important to stay ahead for your comfort.  We recommend taking Milk of Magnesia (2 tablespoons; twice a day) while taking the pain pills.  MEDICATIONS You may be prescribed should start after surgery At your preoperative visit for you history and physical you may have been given the following medications: Zofran  4 mg:  This is to treat nausea and vomiting.  You can take this every 6 hours as needed and only if needed. Oxycodone  5 mg:  This is only to be used after you have taken the  Motrin or the Tylenol . Every 8 hours as needed.   Over the counter Medication to take: Ibuprofen (Motrin) 600 mg:  Take this every 6 hours.  If you have additional pain then take 500 mg of the Tylenol  every 8 hours.  (No Tylenol  until after 3:30pm today, if needed.) Only take the Norco after you have tried these two. MiraLAX or Milk of Magnesia: Take this according to the bottle if you take the Norco.  WHEN TO CALL Call your surgeon's office if any of the following occur: Fever 101 degrees F or greater Excessive bleeding or fluid from the incision site. Pain that increases over time without aid from the  medications Redness, warmth, or pus draining from incision sites Persistent nausea or inability to take in liquids Severe misshapen area that underwent the operation.   Post Anesthesia Home Care Instructions  Activity: Get plenty of rest for the remainder of the day. A responsible individual must stay with you for 24 hours following the procedure.  For the next 24 hours, DO NOT: -Drive a car -Advertising copywriter -Drink alcoholic beverages -Take any medication unless instructed by your physician -Make any legal decisions or sign important papers.  Meals: Start with liquid foods such as gelatin or soup. Progress to regular foods as tolerated. Avoid greasy, spicy, heavy foods. If nausea and/or vomiting occur, drink only clear liquids until the nausea and/or vomiting subsides. Call your physician if vomiting continues.  Special Instructions/Symptoms: Your throat may feel dry or sore from the anesthesia or the breathing tube placed in your throat during surgery. If this causes discomfort, gargle with warm salt water. The discomfort should disappear within 24 hours.  If you had a scopolamine patch placed behind your ear for the management of post- operative nausea and/or vomiting:  1. The medication in the patch is effective for 72 hours, after which it should be removed.  Wrap patch in a tissue and discard in the trash. Wash hands thoroughly with soap and water. 2. You may remove the patch earlier than 72 hours if you experience unpleasant side effects which may include dry mouth, dizziness or visual disturbances. 3. Avoid touching the patch. Wash your hands with soap and water after contact with the patch.

## 2024-05-26 NOTE — Anesthesia Postprocedure Evaluation (Signed)
 Anesthesia Post Note  Patient: Sean Skinner  Procedure(s) Performed: REVISION, SCAR (Scalp) APPLICATION, SKIN SUBSTITUTE (Right: Scalp)     Patient location during evaluation: PACU Anesthesia Type: General Level of consciousness: awake and alert Pain management: pain level controlled Vital Signs Assessment: post-procedure vital signs reviewed and stable Respiratory status: spontaneous breathing, nonlabored ventilation, respiratory function stable and patient connected to nasal cannula oxygen Cardiovascular status: blood pressure returned to baseline and stable Postop Assessment: no apparent nausea or vomiting Anesthetic complications: no   No notable events documented.  Last Vitals:  Vitals:   05/26/24 1345 05/26/24 1355  BP: 109/69 124/74  Pulse: 67 71  Resp: 18 17  Temp:  (!) 36.3 C  SpO2: 94% 94%    Last Pain:  Vitals:   05/26/24 1355  TempSrc:   PainSc: 0-No pain                 Leslye Rast

## 2024-05-26 NOTE — Transfer of Care (Signed)
 Immediate Anesthesia Transfer of Care Note  Patient: Sean Skinner  Procedure(s) Performed: REVISION, SCAR (Scalp) APPLICATION, SKIN SUBSTITUTE (Right: Scalp)  Patient Location: PACU  Anesthesia Type:General  Level of Consciousness: awake, alert , oriented, drowsy, and patient cooperative  Airway & Oxygen Therapy: Patient Spontanous Breathing and Patient connected to face mask oxygen  Post-op Assessment: Report given to RN and Post -op Vital signs reviewed and stable  Post vital signs: Reviewed and stable  Last Vitals:  Vitals Value Taken Time  BP    Temp    Pulse 81 05/26/24 1317  Resp    SpO2 95 % 05/26/24 1317  Vitals shown include unfiled device data.  Last Pain:  Vitals:   05/26/24 0936  TempSrc: Temporal  PainSc: 0-No pain      Patients Stated Pain Goal: 1 (05/26/24 0936)  Complications: No notable events documented.

## 2024-05-26 NOTE — Interval H&P Note (Signed)
 History and Physical Interval Note: No change in exam or indication for surgery Site marked with his concurrence All questions answered. Will proceed with scalp wound preparation and placement of myriad tissue matrix at his request  05/26/2024 10:58 AM  Sean Skinner  has presented today for surgery, with the diagnosis of D04.4.  The various methods of treatment have been discussed with the patient and family. After consideration of risks, benefits and other options for treatment, the patient has consented to  Procedure(s): REVISION, SCAR (N/A) as a surgical intervention.  The patient's history has been reviewed, patient examined, no change in status, stable for surgery.  I have reviewed the patient's chart and labs.  Questions were answered to the patient's satisfaction.     Teretha Ferguson

## 2024-05-26 NOTE — Anesthesia Procedure Notes (Signed)
 Procedure Name: LMA Insertion Date/Time: 05/26/2024 11:43 AM  Performed by: Eugenia Hess, CRNAPre-anesthesia Checklist: Patient identified, Emergency Drugs available, Suction available and Patient being monitored Patient Re-evaluated:Patient Re-evaluated prior to induction Oxygen Delivery Method: Circle System Utilized Preoxygenation: Pre-oxygenation with 100% oxygen Induction Type: IV induction Ventilation: Mask ventilation without difficulty LMA: LMA inserted LMA Size: 5.0 Number of attempts: 1 Airway Equipment and Method: bite block Placement Confirmation: positive ETCO2 Tube secured with: Tape Dental Injury: Teeth and Oropharynx as per pre-operative assessment

## 2024-05-26 NOTE — Op Note (Signed)
 DATE OF OPERATION: 05/26/2024  LOCATION: [facility] operating Room  PREOPERATIVE DIAGNOSIS: Open wound, scalp  POSTOPERATIVE DIAGNOSIS: Same  PROCEDURE: Wound preparation, bone burring, placement of tissue replacement matrix  SURGEON: Kurtis Philips, MD  ASSISTANT: Lamount Pimple  EBL: 10 cc  CONDITION: Stable  COMPLICATIONS: None  INDICATION: The patient, Sean Skinner, is a 83 y.o. male born on Jan 08, 1941, is here for treatment of an open wound on the scalp with exposed cranium.   PROCEDURE DETAILS:  The patient was seen prior to surgery and marked.  The IV antibiotics were given. The patient was taken to the operating room and given a general anesthetic. A standard time out was performed and all information was confirmed by those in the room. SCDs were placed.   The area was prepped and draped in usual sterile manner.  The borders of the wound were infiltrated with quarter percent Marcaine  with epinephrine .  I elected to excise a 1 mm rim from the edges of the wound.  The wound measured 4 x 4 cm.  I then used a 4 mm diamond bur to bur the exposed cranium.  The area of burred was approximately 3 x 3 cm.  There was punctate bleeding on only a very small portion of the exposed cranium.  The wound was then irrigated with Vashe.  A 5 x 5 cm portion of myriad tissue matrix was soaked in saline placed on the wound and sutured in place with interrupted 4-0 Monocryl sutures.  Sorbact was placed over the wound and sutured in place with interrupted 4-0 Prolene sutures.  A dressing of surgical lube, Vashe soaked gauze and a Mepilex border were placed over the wound.  Patient was awakened from anesthesia without incident transferred to the recovery room in good condition.  All instrument needle and sponge counts reported as correct and there were no complications. The patient was allowed to wake up and taken to recovery room in stable condition at the end of the case. The family was notified at the  end of the case.   The advanced practice practitioner (APP) assisted throughout the case.  The APP was essential in retraction and counter traction when needed to make the case progress smoothly.  This retraction and assistance made it possible to see the tissue plans for the procedure.  The assistance was needed for blood control, tissue re-approximation and assisted with closure of the incision site.

## 2024-05-29 ENCOUNTER — Encounter (HOSPITAL_BASED_OUTPATIENT_CLINIC_OR_DEPARTMENT_OTHER): Payer: Self-pay | Admitting: Plastic Surgery

## 2024-06-01 ENCOUNTER — Ambulatory Visit (INDEPENDENT_AMBULATORY_CARE_PROVIDER_SITE_OTHER): Admitting: Plastic Surgery

## 2024-06-01 VITALS — BP 150/82 | HR 76

## 2024-06-01 DIAGNOSIS — S0100XD Unspecified open wound of scalp, subsequent encounter: Secondary | ICD-10-CM

## 2024-06-01 NOTE — Progress Notes (Signed)
 Mr. Sean Skinner returns today approximately a week out from perforation of his scalp wound and placement of myriad.  He states he has had no problems since surgery and had no discomfort.  On inspection the Sorbact is intact as are the Steri-Strips.  There is no evidence of infection around the incision.  Will let him begin washing his hair and getting water on the wound.  He will return next Monday to have the sore back removed.

## 2024-06-02 NOTE — Progress Notes (Signed)
 Patient is a pleasant 83 year old male with history of scalp SCC with radiation and subsequent open wound now s/p debridement of wound with burring and placement of myriad tissue matrix performed 05/26/2024 by Dr. Carolynne Citron who presents to clinic for postoperative follow-up.  He was seen in clinic 06/01/2024, but the Sorbact was left in place.  Per operative report, secured with Prolene sutures.  Recommend that he follow-up in 4 days for removal of the dressing and transition to Adaptic and K-Y jelly.  Today, patient is doing well.  Accompanied by wife at bedside.  They have been applying K-Y jelly over top of the Sorbact followed by Vashe soaked gauze and then a bordered Mepilex dressing.  He denies any pain or other concerns.  He has done well from a postoperative standpoint.  Continues to live an active lifestyle.  On exam, Sorbact is carefully removed without complication or bleeding.  Gentle debridement with Vashe soaked gauze.  Sharp scissors were used for some of the unincorporated myriad over the skin edges that were desiccated, firm, and not likely to incorporate.  There remains a moderate amount of myriad centrally that is still in the process of incorporation.  Evidence of some granulation from underneath, as well.  Surrounding skin and tissue appears healthy and is without erythema or tenderness.  The wound is appearing healthy with some evidence of granulation from underneath the remaining unincorporated myriad.  Punctate bleeding was noted centrally.  Will recommend continued dressing changes.  Adaptic can be changed every other day, but recommending application of KY jelly surgery lube 1-2 times daily followed by Vashe soaked gauze and then a dry gauze versus Mepilex bordered dressing.  Follow-up in 2 weeks for reevaluation, sooner if needed.  Patient and spouse understand that the malodor is attributable to the slough/unincorporated myriad.  Picture(s) obtained of the patient and placed in the  chart were with the patient's or guardian's permission.

## 2024-06-05 ENCOUNTER — Ambulatory Visit: Admitting: Physician Assistant

## 2024-06-05 ENCOUNTER — Encounter: Payer: Self-pay | Admitting: Physician Assistant

## 2024-06-05 VITALS — BP 117/73 | HR 80 | Ht 74.0 in | Wt 182.0 lb

## 2024-06-05 DIAGNOSIS — S0100XD Unspecified open wound of scalp, subsequent encounter: Secondary | ICD-10-CM

## 2024-06-19 ENCOUNTER — Encounter: Admitting: Physician Assistant

## 2024-06-19 DIAGNOSIS — D7219 Other eosinophilia: Secondary | ICD-10-CM | POA: Diagnosis not present

## 2024-06-19 DIAGNOSIS — Z79899 Other long term (current) drug therapy: Secondary | ICD-10-CM | POA: Diagnosis not present

## 2024-06-19 DIAGNOSIS — D72119 Hypereosinophilic syndrome (hes), unspecified: Secondary | ICD-10-CM | POA: Diagnosis not present

## 2024-06-19 DIAGNOSIS — R748 Abnormal levels of other serum enzymes: Secondary | ICD-10-CM | POA: Diagnosis not present

## 2024-06-19 DIAGNOSIS — D7589 Other specified diseases of blood and blood-forming organs: Secondary | ICD-10-CM | POA: Diagnosis not present

## 2024-06-20 NOTE — Progress Notes (Signed)
 Patient is a pleasant 83 year old male with history of scalp SCC with radiation and subsequent open wound now s/p debridement of wound with burring and placement of myriad tissue matrix performed 05/26/2024 by Dr. Waddell who presents to clinic for postoperative follow-up.   He was last seen here in clinic on 06/05/2024.  At that time, Sorbact was removed carefully without complication bleeding.  Gentle debridement with Vashe soaked gauze.  There remains a moderate amount of myriad centrally that is still incorporating.  Evidence of granulation from underneath.  Surrounding skin and tissue appears healthy.  Follow-up in 2 weeks, sooner if needed.  Recommending K-Y jelly 1-2 times daily followed by Vashe soaked gauze.  Today, patient is doing well.  Accompanied by wife at bedside.  She has been performing the dressing changes, as discussed.  They feel as though the wound is improving.  On exam, evidence of good granulation from underneath the still incorporating myriad.  Surrounding skin and tissue appears healthy.  No evidence of tape dermatitis.  The wound bed was gently debrided with Vashe soaked gauze.  Picture(s) obtained of the patient and placed in the chart were with the patient's or guardian's permission.  Continue with current dressing changes.  The wound is looking much better than anticipated given only punctate bleeding noted during burring of calvarium.  Patient and wife are pleased with progress.  Follow-up in 2 to 3 weeks for reevaluation.

## 2024-06-21 ENCOUNTER — Ambulatory Visit (INDEPENDENT_AMBULATORY_CARE_PROVIDER_SITE_OTHER): Admitting: Physician Assistant

## 2024-06-21 VITALS — BP 134/81 | HR 80

## 2024-06-21 DIAGNOSIS — S0100XD Unspecified open wound of scalp, subsequent encounter: Secondary | ICD-10-CM

## 2024-07-06 ENCOUNTER — Encounter: Admitting: Physician Assistant

## 2024-07-07 ENCOUNTER — Ambulatory Visit: Admitting: Physician Assistant

## 2024-07-07 VITALS — BP 133/74 | HR 77

## 2024-07-07 DIAGNOSIS — Z9889 Other specified postprocedural states: Secondary | ICD-10-CM

## 2024-07-07 DIAGNOSIS — S0100XD Unspecified open wound of scalp, subsequent encounter: Secondary | ICD-10-CM

## 2024-07-07 NOTE — Progress Notes (Signed)
 Patient is a pleasant 83 year old male with history of scalp SCC with radiation and subsequent open wound now s/p debridement of wound with burring and placement of myriad tissue matrix performed 05/26/2024 by Dr. Waddell who presents to clinic for postoperative follow-up.   He was last seen here in clinic on 06/21/2024.  At that time, there was evidence of good granulation from underneath is still incorporating the area and.  Surrounding skin tissue appeared healthy.  Recommend continued dressing changes with K-Y jelly and Vashe soaked gauze.  Today, patient is doing well.  He is accompanied by wife at bedside.  Continues to apply Vashe soaked gauze and K-Y jelly followed by bordered foam dressing and tape.  No specific concerns or complaints.  On exam, attempted to mechanically debride the yellow nonviable tissue, but it is calcified.  There are islands of granulation throughout, but the wound has not had any considerable shrinkage or change in any capacity since the most recent encounter 3 weeks ago.  Surrounding skin and tissue does appear healthy.  No evidence concerning for infection.  No malodor.  No drainage.  Dr. Waddell also personally evaluated patient.  Unfortunately, there are no good options for debriding the calcified yellow tissue throughout wound bed.  At this point, patient is informed that there is not much more we can offer from a postoperative standpoint.  Instead, if it fails to improve and he seeks additional reconstruction he will need to be referred to somebody who could provide free flap reconstruction.  At this time, patient is not interested given the likelihood for hospitalization postoperatively.  Instead, plan to follow-up with Dr. Waddell in 3 months for reevaluation  in the event he would like that referral to tertiary care center.

## 2024-09-21 ENCOUNTER — Encounter (HOSPITAL_COMMUNITY): Payer: Self-pay | Admitting: Emergency Medicine

## 2024-09-21 ENCOUNTER — Other Ambulatory Visit: Payer: Self-pay

## 2024-09-21 ENCOUNTER — Emergency Department (HOSPITAL_COMMUNITY)

## 2024-09-21 ENCOUNTER — Emergency Department (HOSPITAL_COMMUNITY)
Admission: EM | Admit: 2024-09-21 | Discharge: 2024-09-22 | Disposition: A | Discharge status: Home / Self Care | Source: Ambulatory Visit | Attending: Emergency Medicine | Admitting: Emergency Medicine

## 2024-09-21 DIAGNOSIS — W010XXA Fall on same level from slipping, tripping and stumbling without subsequent striking against object, initial encounter: Secondary | ICD-10-CM | POA: Diagnosis not present

## 2024-09-21 DIAGNOSIS — I517 Cardiomegaly: Secondary | ICD-10-CM | POA: Diagnosis not present

## 2024-09-21 DIAGNOSIS — S20302A Unspecified superficial injuries of left front wall of thorax, initial encounter: Secondary | ICD-10-CM | POA: Diagnosis present

## 2024-09-21 DIAGNOSIS — S2242XA Multiple fractures of ribs, left side, initial encounter for closed fracture: Secondary | ICD-10-CM | POA: Diagnosis not present

## 2024-09-21 DIAGNOSIS — J9811 Atelectasis: Secondary | ICD-10-CM | POA: Insufficient documentation

## 2024-09-21 DIAGNOSIS — S3993XA Unspecified injury of pelvis, initial encounter: Secondary | ICD-10-CM | POA: Diagnosis not present

## 2024-09-21 DIAGNOSIS — S2243XA Multiple fractures of ribs, bilateral, initial encounter for closed fracture: Secondary | ICD-10-CM | POA: Diagnosis not present

## 2024-09-21 DIAGNOSIS — R109 Unspecified abdominal pain: Secondary | ICD-10-CM | POA: Insufficient documentation

## 2024-09-21 DIAGNOSIS — S299XXA Unspecified injury of thorax, initial encounter: Secondary | ICD-10-CM | POA: Diagnosis not present

## 2024-09-21 DIAGNOSIS — S81021A Laceration with foreign body, right knee, initial encounter: Secondary | ICD-10-CM | POA: Diagnosis not present

## 2024-09-21 DIAGNOSIS — S81011A Laceration without foreign body, right knee, initial encounter: Secondary | ICD-10-CM | POA: Diagnosis not present

## 2024-09-21 DIAGNOSIS — M25561 Pain in right knee: Secondary | ICD-10-CM | POA: Diagnosis not present

## 2024-09-21 DIAGNOSIS — S3991XA Unspecified injury of abdomen, initial encounter: Secondary | ICD-10-CM | POA: Diagnosis not present

## 2024-09-21 DIAGNOSIS — W19XXXA Unspecified fall, initial encounter: Secondary | ICD-10-CM | POA: Diagnosis not present

## 2024-09-21 DIAGNOSIS — S61012A Laceration without foreign body of left thumb without damage to nail, initial encounter: Secondary | ICD-10-CM | POA: Diagnosis not present

## 2024-09-21 MED ORDER — CEPHALEXIN 500 MG PO CAPS
500.0000 mg | ORAL_CAPSULE | Freq: Once | ORAL | Status: AC
  Administered 2024-09-22: 500 mg via ORAL
  Filled 2024-09-21: qty 1

## 2024-09-21 MED ORDER — LIDOCAINE HCL (PF) 1 % IJ SOLN
10.0000 mL | Freq: Once | INTRAMUSCULAR | Status: AC
  Administered 2024-09-22: 10 mL
  Filled 2024-09-21: qty 30

## 2024-09-21 MED ORDER — CEPHALEXIN 500 MG PO CAPS: 500.0000 mg | ORAL_CAPSULE | Freq: Two times a day (BID) | ORAL | 0 refills | Status: AC

## 2024-09-21 NOTE — Progress Notes (Signed)
 [address] - AMBULATORY [facility] [address] [place] [address]   Date of Service: 09/21/2024 Patient DOB: February 15, 1941    History of Present Illness   Patient ID: Sean Skinner is a 83 y.o. male. Patient presents to urgent care with wife due to that occurred approximately 1 to 2 hours ago.  Patient states he was walking next to a construction site when he fell into a hole with his right leg.  Reports laceration of right knee and left thumb.  States that he grabbed a metal rod that hit his left side.  Denies significant discomfort with ambulation to right knee.  Denies hitting his head, LOC, blood thinner use.  Active Ambulatory Problems    Diagnosis Date Noted  . Bilateral impacted cerumen 05/30/2019   Resolved Ambulatory Problems    Diagnosis Date Noted  . No Resolved Ambulatory Problems   Past Medical History:  Diagnosis Date  . Basal cell carcinoma (BCC) in situ of skin   . Shingles (herpes zoster) polyneuropathy     BP 122/84 (BP Location: Left arm, Patient Position: Sitting)   Pulse 78   Ht 1.88 m (6' 2)   Wt 85.7 kg (189 lb)   BMI 24.27 kg/m    Review of Systems   Review of Systems  Skin:  Positive for wound.     Physicial Exam   Physical Exam Vitals and nursing note reviewed.  Constitutional:      Appearance: Normal appearance. He is normal weight.  HENT:     Head: Normocephalic and atraumatic.     Right Ear: External ear normal.     Left Ear: External ear normal.     Nose: Nose normal.  Eyes:     Extraocular Movements: Extraocular movements intact.     Conjunctiva/sclera: Conjunctivae normal.  Cardiovascular:     Rate and Rhythm: Normal rate and regular rhythm.     Pulses: Normal pulses.     Heart sounds: Normal heart sounds.  Pulmonary:     Effort: Pulmonary effort is normal.     Breath sounds: Normal breath sounds.  Chest:     Chest wall: No  tenderness.  Musculoskeletal:        General: Normal range of motion.     Cervical back: Normal range of motion and neck supple.  Skin:    General: Skin is warm and dry.     Comments: Multiple skin avulsions of LUE, left side, LLE.  2 cm laceration of left hand 1st interdigital web space. 3 cm laceration over right knee with bone and fascia exposure.   Neurological:     General: No focal deficit present.     Mental Status: He is alert and oriented to person, place, and time.  Psychiatric:        Mood and Affect: Mood normal.     Imaging   XR Knee 3 Views Right  Final Result by Rankin Ina Slice, MD (09/25 2132)  X-RAY KNEE RIGHT (3 VIEWS), 09/21/2024 9:25 PM    INDICATION: Unspecified fall, initial encounter \ W19.XXXA Unspecified   fall, initial encounter   COMPARISON: None.    IMPRESSION:  1.  No acute fracture.   2.  No malalignment.  3.  Small knee joint effusion.  4.  Severe patellofemoral and mild medial and lateral tricompartmental   degenerative changes.  5.  Tibial tubercle chronic enthesophyte fragmentation.  6.  16  x 10 mm retained radio density within the anterior knee and   prepatellar soft tissues.        I have personally reviewed imaging.  Diagnosis   Sean Skinner was seen today for fall.  Diagnoses and all orders for this visit:  Laceration of right knee, initial encounter  Fall, initial encounter -     XR Knee 3 Views Right  Avulsion of skin     Medical Decision Making Sean Skinner is a 83 y.o. male who presents to urgent care today with complaints of  Chief Complaint  Patient presents with  . Sean Skinner in a Holiday representative site. Multiple skin tears and a laceration to inside of left thumb and index due to catching metal rod when falling. No blood thinners. No loc. Vomited after standing up really fast. Since  then no other concerns and is no longer dizzy. Did not hit head.    On exam, VS stable and patient is afebrile. Appears  well-hydrated and capillary refill <2 seconds.   Patient presents to urgent care due to fall.  On exam patient has appear to have a laceration of right knee with fascia and bone exposure.  Tdap utd. Lacerations and skin tears were irrigated and bandaged.  XR knee: 1.  No acute fracture.  2.  No malalignment. 3.  Small knee joint effusion. 4.  Severe patellofemoral and mild medial and lateral tricompartmental degenerative changes. 5.  Tibial tubercle chronic enthesophyte fragmentation. 6.  16 x 10 mm retained radio density within the anterior knee and prepatellar soft tissues.  Given location of laceration, fascia, and what appears to be bone exposure near joint space with retained foreign body, I recommend patient be further evaluated in ED.    Discharge Information  Symptomatic management discussed.  Patient was given verbal and written instructions on symptoms that necessitate return to the UC/ED, and instructed to f/u w/ UC or PCP if not improving in expected timeframe.   Patient/parent has been instructed on RX/OTC medications, dosages, side effects, and possible interactions as associated with each diagnosis in my impression and plan above.   Patient education (verbal/handout) given on diagnosis, pathophysiology, treatment of diagnosis, side effects of medication use for treatment, restrictions while taking medication, supportives measures such as staying hydrated.   Red Flags associated with diagnosis/es were reviewed and patient instructed on action plan if red flags develop.   They have been instructed that if symptoms worsen or red flags develop they should return to Urgent Care, go to the nearest ED, or activate EMS/911.     Patient and/or parent/guardian (if applicable) agreed with plan and voiced understanding.  No barriers to adherence perceived by myself.   Portions of this note may have been dictated using Dragon dictation software/hardware and may contain grammatical or  spelling errors.    If a new prescription was given today, then I discussed potential side effects, drug interactions, instructions for taking the medication, and the consequences of not taking it.    F/u: Follow up closely with primary care provider (PCP) and other specialists for further care and routine care, but seek medical attention sooner if worsening/concerning signs or symptoms.  Follow up with ED   Electronically signed by: Darryle Slater Fish, PA-C 09/21/2024 9:59 PM

## 2024-09-21 NOTE — ED Provider Notes (Signed)
 Clinical Course as of 09/21/24 2334  Thu Sep 21, 2024  2248 Is an 43 old male who presents after having a mechanical fall into a construction site hole today, landing on his left side, and also scraping his hands and knees.  He went to urgent care where he had x-ray imaging done initially and there was concern for possible retained foreign body overlying the right knee near the laceration.  He was scented to the ED for further workup.  On exam the patient is well-appearing.  He denies any head trauma or injury (he had a Mohs procedure in the past and has a bandage on his head).  He is not on blood thinners.  He reports he is having an aching pain in his left upper abdomen where he struck a steel bar.  Denies nausea or vomiting.  He does not have reproducible rib tenderness on exam or ecchymosis near the abdomen but does have a small contusion overlying the left upper quadrant.  There are superficial lacerations to the left knee that have been cleaned and bandaged at urgent care.  He has a 3 cm linear laceration underneath the left thumb, near the proximal joint, with full range of motion of the joint no evidence of tendon involvement or injury.  We will plan to irrigate these and closed these likely with sutures. [MT]  2250 This inferior to the right patella he has a circular, gaping 2 cm wound, which extends through the subcutaneous fat, it has some small communication with the underlying patella bone.  Patient is able to walk and bear weight and does not have evidence of a joint contamination on exam.  We'll irrigate and close this wound.  He has a small dark fragment of possible stone or metal superior to his patellar wound, which likely correlates with the reported retained foreign body at [facility] UC note.  We'll remove this, it's quite superficial, on exam. [MT]  2251 Plan for xray ribs and trauma ct imaging of the abdomen to ensure no splenic or internal injury.  IF workup is negative pt can otherwise be  discharged home on keflex  for his knee wound- needs suture removal in 10 days [MT]  2332 2 rib fx suspected on xray - pt is breathing comfortably  [MT]    Clinical Course User Index [MT] Keaton Beichner, [name], MD      Cottie [name], MD 09/21/24 [phone]

## 2024-09-21 NOTE — Discharge Instructions (Addendum)
 Your stitches in your left thumb and right knee should be removed in 10 days.  Keep them dry for 48 hours.  After that you can shower with regular soap and water.  Your imaging shows that you have at least 2 broken ribs on your left side.  I recommend using incentive spirometer at home, which is a breathing exercise, taking 10 slow breaths in at a time, 10 times a day for 10 days.  This is an exercise to help prevent pneumonia.  I prescribed you 7 days of an antibiotic called Keflex  to help prevent your wounds from getting infected.

## 2024-09-21 NOTE — ED Triage Notes (Signed)
 Pt arrives via POV following a fall. Pt sts tripping over hole at a construction site.   Urgent care note:  [place] in a [employer] site. Multiple skin tears and a laceration to inside of left thumb and index due to catching metal rod when falling. No blood thinners. No loc. Vomited after standing up really fast. Since  then no other concerns and is no longer dizzy. Did not hit head.   Was evaluated at urgent care PTA. Recommended ED visit for laceration  to left hand and concern for abnormal XR of R Knee.

## 2024-09-22 ENCOUNTER — Emergency Department (HOSPITAL_COMMUNITY)

## 2024-09-22 DIAGNOSIS — S3991XA Unspecified injury of abdomen, initial encounter: Secondary | ICD-10-CM | POA: Diagnosis not present

## 2024-09-22 DIAGNOSIS — S3993XA Unspecified injury of pelvis, initial encounter: Secondary | ICD-10-CM | POA: Diagnosis not present

## 2024-09-22 DIAGNOSIS — S299XXA Unspecified injury of thorax, initial encounter: Secondary | ICD-10-CM | POA: Diagnosis not present

## 2024-09-22 LAB — CBC
HCT: 37.2 % — ABNORMAL LOW (ref 39.0–52.0)
Hemoglobin: 12.1 g/dL — ABNORMAL LOW (ref 13.0–17.0)
MCH: 33.2 pg (ref 26.0–34.0)
MCHC: 32.5 g/dL (ref 30.0–36.0)
MCV: 102.2 fL — ABNORMAL HIGH (ref 80.0–100.0)
Platelets: 270 K/uL (ref 150–400)
RBC: 3.64 MIL/uL — ABNORMAL LOW (ref 4.22–5.81)
RDW: 12.6 % (ref 11.5–15.5)
WBC: 8.9 K/uL (ref 4.0–10.5)
nRBC: 0 % (ref 0.0–0.2)

## 2024-09-22 LAB — BASIC METABOLIC PANEL WITH GFR
Anion gap: 11 (ref 5–15)
BUN: 38 mg/dL — ABNORMAL HIGH (ref 8–23)
CO2: 19 mmol/L — ABNORMAL LOW (ref 22–32)
Calcium: 8.9 mg/dL (ref 8.9–10.3)
Chloride: 107 mmol/L (ref 98–111)
Creatinine, Ser: 1.23 mg/dL (ref 0.61–1.24)
GFR, Estimated: 58 mL/min — ABNORMAL LOW (ref >60)
Glucose, Bld: 116 mg/dL — ABNORMAL HIGH (ref 70–99)
Potassium: 4 mmol/L (ref 3.5–5.1)
Sodium: 137 mmol/L (ref 135–145)

## 2024-09-22 MED ORDER — IOHEXOL 300 MG/ML  SOLN
100.0000 mL | Freq: Once | INTRAMUSCULAR | Status: AC | PRN
Start: 2024-09-22 — End: 2024-09-22
  Administered 2024-09-22: 100 mL via INTRAVENOUS

## 2024-09-22 NOTE — ED Provider Notes (Signed)
Physical Exam  BP (!) 135/98   Pulse 80   Temp 97.9 F (36.6 C) (Oral)   Resp 18   SpO2 98%   Physical Exam  Procedures  Procedures  ED Course / MDM   Clinical Course as of 09/22/24 0336  Thu Sep 21, 2024  2248 Is an 44 old male who presents after having a mechanical fall into a construction site hole today, landing on his left side, and also scraping his hands and knees.  He went to urgent care where he had x-ray imaging done initially and there was concern for possible retained foreign body overlying the right knee near the laceration.  He was scented to the ED for further workup.  On exam the patient is well-appearing.  He denies any head trauma or injury (he had a Mohs procedure in the past and has a bandage on his head).  He is not on blood thinners.  He reports he is having an aching pain in his left upper abdomen where he struck a steel bar.  Denies nausea or vomiting.  He does not have reproducible rib tenderness on exam or ecchymosis near the abdomen but does have a small contusion overlying the left upper quadrant.  There are superficial lacerations to the left knee that have been cleaned and bandaged at urgent care.  He has a 3 cm linear laceration underneath the left thumb, near the proximal joint, with full range of motion of the joint no evidence of tendon involvement or injury.  We will plan to irrigate these and closed these likely with sutures. [MT]  2250 This inferior to the right patella he has a circular, gaping 2 cm wound, which extends through the subcutaneous fat, it has some small communication with the underlying patella bone.  Patient is able to walk and bear weight and does not have evidence of a joint contamination on exam.  We'll irrigate and close this wound.  He has a small dark fragment of possible stone or metal superior to his patellar wound, which likely correlates with the reported retained foreign body at [facility] UC note.  We'll remove this, it's quite  superficial, on exam. [MT]  2251 Plan for xray ribs and trauma ct imaging of the abdomen to ensure no splenic or internal injury.  IF workup is negative pt can otherwise be discharged home on keflex  for his knee wound- needs suture removal in 10 days [MT]  2332 2 rib fx suspected on xray - pt is breathing comfortably  [MT]    Clinical Course User Index [MT] [name], [name], MD   Medical Decision Making Amount and/or Complexity of Data Reviewed Labs: ordered. Radiology: ordered.  Risk Prescription drug management.   Patient care assumed at shift handoff from previous provider.  Please see their note for full details.  In short, patient is an 83 year old male with no pertinent past medical history who presented to the emergency department from urgent care after a ground-level fall.  Imaging showed a foreign body inside a laceration in the anterior right knee.  Laceration in the right knee and in the thumb repaired by previous provider.  Patient had some left-sided tenderness and previous provider ordered trauma CT imaging of the abdomen to ensure no splenic or internal injury.  CT grossly unremarkable showing no obvious traumatic injuries.  Patient appears stable for discharge home at this time.       Sean Skinner 09/22/24 [id number]    Sean Raynell Moder, MD  09/22/24 0813  

## 2024-09-22 NOTE — ED Notes (Signed)
 Wound care done for patient's knee. Pt. Instructed on how to use incentive spirometer. Pt. Successfully demonstrates how to use prior to discharge.

## 2024-09-22 NOTE — ED Provider Notes (Addendum)
 [place] AT [facility] Provider Note   CSN: [id number] Arrival date & time: 09/21/24  2209     Patient presents with: Sean Skinner is a 83 y.o. male with no pertinent pmhx who presents to the ED after a ground level fall this evening (09/25). He was seen at urgent care who identified a foreign body in his anterior knee and he was sent to the ED for further evaluation. He did not hit his head. Tdap updated at urgent care. Additionally, patient reported left sided abdominal pain with mild tenderness to palpation. Not on blood thinners.   Fall       Prior to Admission medications   Medication Sig Start Date End Date Taking? Authorizing Provider  cephALEXin  (KEFLEX ) 500 MG capsule Take 1 capsule (500 mg total) by mouth 2 (two) times daily for 7 days. 09/22/24 09/29/24 Yes [name], [name], MD  cyanocobalamin  (VITAMIN B12) 1000 MCG tablet Take 1,000 mcg by mouth daily.    [provider]  predniSONE  (DELTASONE ) 5 MG tablet Take 5 mg by mouth daily with breakfast.    [provider]  valACYclovir  (VALTREX ) 1000 MG tablet  09/25/19   [provider]    Allergies: Patient has no known allergies.    Review of Systems  Skin:  Positive for wound.    Updated Vital Signs BP (!) 154/106 (BP Location: Left Arm)   Pulse 82   Temp 97.7 F (36.5 C) (Oral)   Resp 16   SpO2 99%   Physical Exam Vitals and nursing note reviewed.  Constitutional:      Appearance: Normal appearance.  HENT:     Head: Normocephalic and atraumatic.     Mouth/Throat:     Mouth: Mucous membranes are moist.  Eyes:     General: No scleral icterus.       Right eye: No discharge.        Left eye: No discharge.     Conjunctiva/sclera: Conjunctivae normal.  Cardiovascular:     Rate and Rhythm: Normal rate and regular rhythm.     Pulses: Normal pulses.  Pulmonary:     Effort: Pulmonary effort is normal.     Breath sounds: Normal breath  sounds.  Abdominal:     General: There is no distension.     Tenderness: There is no abdominal tenderness.  Musculoskeletal:        General: No deformity.     Left hand: Laceration present. Normal strength. Normal sensation.     Cervical back: Normal range of motion.     Right knee: Laceration present.     Comments: 3cm laceration to the right hand proximal to thumb. Sensation intact. 5/5 strength. No tendon injury  Right knee with 2cm circular laceration with exposed bone. Small foreign body noted on anterior surface of knee, likely asphalt secondary to fall  Skin:    General: Skin is warm and dry.     Capillary Refill: Capillary refill takes less than 2 seconds.  Neurological:     Mental Status: He is alert.     Motor: No weakness.  Psychiatric:        Mood and Affect: Mood normal.     (all labs ordered are listed, but only abnormal results are displayed) Labs Reviewed  BASIC METABOLIC PANEL WITH GFR  CBC    EKG: None  Radiology: DG Ribs Unilateral W/Chest Left Result Date: 09/21/2024 CLINICAL DATA:  Left lower rib pain  and tenderness after trip and fall injury. EXAM: LEFT RIBS AND CHEST - 3+ VIEW COMPARISON:  Chest radiograph 08/16/2023. CT chest abdomen and pelvis 08/16/2023. FINDINGS: Shallow inspiration. Cardiac enlargement. No vascular congestion, edema, or consolidation. Linear atelectasis in the lung bases, greater on the right. No pleural effusion or pneumothorax. Mediastinal contours appear intact. Acute nondisplaced fractures of the left anterior and lateral ninth and tenth ribs. No focal bone lesion or bone destruction. Soft tissues are unremarkable. IMPRESSION: 1. Cardiac enlargement. No evidence of active pulmonary disease. Atelectasis in the lung bases. 2. Acute nondisplaced fractures of the left ninth and tenth ribs. Electronically Signed   By: Elsie Gravely M.D.   On: 09/21/2024 23:18    .Laceration Repair  Date/Time: 09/22/2024 12:30 AM  Performed by:  Torrence Marry RAMAN, PA-C Authorized by: Torrence Marry RAMAN, PA-C   Consent:    Consent obtained:  Verbal   Consent given by:  Patient   Risks, benefits, and alternatives were discussed: yes     Risks discussed:  Infection, pain, retained foreign body and poor cosmetic result Universal protocol:    Procedure explained and questions answered to patient or proxy's satisfaction: yes     Relevant documents present and verified: yes     Imaging studies available: yes     Patient identity confirmed:  Verbally with patient Anesthesia:    Anesthesia method:  Local infiltration   Local anesthetic:  Lidocaine  1% WITH epi Laceration details:    Location:  Hand   Hand location:  L palm   Length (cm):  3 Pre-procedure details:    Preparation:  Patient was prepped and draped in usual sterile fashion Exploration:    Limited defect created (wound extended): yes     Imaging outcome: foreign body not noted     Wound exploration: wound explored through full range of motion     Wound extent: no foreign body, no signs of injury, no nerve damage, no tendon damage and no underlying fracture     Contaminated: yes   Treatment:    Area cleansed with:  Saline   Amount of cleaning:  Standard   Irrigation solution: irrigated with NS.   Irrigation method:  Syringe   Visualized foreign bodies/material removed: no     Debridement:  None   Scar revision: no   Skin repair:    Repair method:  Sutures   Suture size:  5-0   Suture material:  Prolene   Suture technique:  Simple interrupted   Number of sutures:  6 Approximation:    Approximation:  Close Repair type:    Repair type:  Simple Post-procedure details:    Dressing:  Adhesive bandage   Procedure completion:  Tolerated well, no immediate complications .Laceration Repair  Date/Time: 09/22/2024 12:41 AM  Performed by: Torrence Marry RAMAN, PA-C Authorized by: Torrence Marry RAMAN, PA-C   Consent:    Consent obtained:  Verbal   Consent given by:   Patient   Risks, benefits, and alternatives were discussed: yes     Risks discussed:  Retained foreign body, infection and poor cosmetic result Universal protocol:    Procedure explained and questions answered to patient or proxy's satisfaction: yes     Test results available: yes     Imaging studies available: yes     Required blood products, implants, devices, and special equipment available: no     Patient identity confirmed:  Verbally with patient Anesthesia:    Anesthesia method:  Local infiltration  Local anesthetic:  Lidocaine  1% WITH epi Laceration details:    Location:  Leg   Leg location:  R knee   Length (cm):  2 Pre-procedure details:    Preparation:  Imaging obtained to evaluate for foreign bodies Exploration:    Limited defect created (wound extended): yes     Imaging obtained: x-ray     Imaging outcome: foreign body noted (foreign body removed prior to lac repair)     Wound exploration: wound explored through full range of motion     Wound extent: no signs of injury, no nerve damage, no tendon damage and no underlying fracture     Contaminated: yes   Treatment:    Area cleansed with:  Saline   Amount of cleaning:  Standard   Irrigation solution: irrigated with NS.   Irrigation method:  Syringe   Visualized foreign bodies/material removed: yes     Debridement:  None Skin repair:    Repair method:  Sutures   Suture size:  3-0   Suture material:  Prolene   Suture technique:  Simple interrupted   Number of sutures:  6 Approximation:    Approximation:  Loose Repair type:    Repair type:  Simple Post-procedure details:    Dressing:  Non-adherent dressing   Procedure completion:  Tolerated well, no immediate complications    Medications Ordered in the ED  lidocaine  (PF) (XYLOCAINE ) 1 % injection 10 mL (10 mLs Infiltration Given by Other 09/22/24 0029)  cephALEXin  (KEFLEX ) capsule 500 mg (500 mg Oral Given 09/22/24 0030)    Clinical Course as of 09/22/24  0044  Thu Sep 21, 2024  2248 Is an 57 old male who presents after having a mechanical fall into a construction site hole today, landing on his left side, and also scraping his hands and knees.  He went to urgent care where he had x-ray imaging done initially and there was concern for possible retained foreign body overlying the right knee near the laceration.  He was scented to the ED for further workup.  On exam the patient is well-appearing.  He denies any head trauma or injury (he had a Mohs procedure in the past and has a bandage on his head).  He is not on blood thinners.  He reports he is having an aching pain in his left upper abdomen where he struck a steel bar.  Denies nausea or vomiting.  He does not have reproducible rib tenderness on exam or ecchymosis near the abdomen but does have a small contusion overlying the left upper quadrant.  There are superficial lacerations to the left knee that have been cleaned and bandaged at urgent care.  He has a 3 cm linear laceration underneath the left thumb, near the proximal joint, with full range of motion of the joint no evidence of tendon involvement or injury.  We will plan to irrigate these and closed these likely with sutures. [MT]  2250 This inferior to the right patella he has a circular, gaping 2 cm wound, which extends through the subcutaneous fat, it has some small communication with the underlying patella bone.  Patient is able to walk and bear weight and does not have evidence of a joint contamination on exam.  We'll irrigate and close this wound.  He has a small dark fragment of possible stone or metal superior to his patellar wound, which likely correlates with the reported retained foreign body at [facility] UC note.  We'll remove this, it's quite superficial, on  exam. [MT]  2251 Plan for xray ribs and trauma ct imaging of the abdomen to ensure no splenic or internal injury.  IF workup is negative pt can otherwise be discharged home on keflex  for his  knee wound- needs suture removal in 10 days [MT]  2332 2 rib fx suspected on xray - pt is breathing comfortably  [MT]    Clinical Course User Index [MT] [name], [name], MD                               Medical Decision Making Amount and/or Complexity of Data Reviewed Labs: ordered. Radiology: ordered.  Risk Prescription drug management.   This patient presents to the ED for concern of multiple lacerations and left sided abdominal pain, this involves an extensive number of treatment options, and is a complaint that carries with it a high risk of complications and morbidity.  Differential diagnosis includes: tendon injury, nerve injury, patella fracture, rib fractures, pneumothorax, splenic injury.  Co morbidities: None   Additional history:  Patient evaluated at urgent care prior to ED arrival. Xrays of knee completed at that time showing a foreign body in the right knee.   Imaging Studies:  I ordered imaging studies including chest xray, CT chest, abdomen pelvis I independently visualized and interpreted imaging which showed multiple rib fractures on chest xray. CT not yet complete so pending. I agree with the radiologist interpretation   Medicines ordered and prescription drug management:  I ordered medication including  Medications  lidocaine  (PF) (XYLOCAINE ) 1 % injection 10 mL (10 mLs Infiltration Given by Other 09/22/24 0029)  cephALEXin  (KEFLEX ) capsule 500 mg (500 mg Oral Given 09/22/24 0030)   for numbing agent and infection control Reevaluation of the patient after these medicines showed that the patient improved I have reviewed the patients home medicines and have made adjustments as needed  Test Considered:  None  Critical Interventions:  None  Consultations Obtained: - None  Problem List / ED Course:     ICD-10-CM   1. Fall, initial encounter  W19.XXXA     2. Laceration of left thumb without foreign body without damage to nail, initial  encounter  S61.012A     3. Laceration of right knee, initial encounter  S81.011A     4. Closed fracture of multiple ribs of left side, initial encounter  S22.42XA       MDM: Patient is an 83 year old male who had a ground level fall on asphalt this evening. He was walking past a [employer] site with his wife when he fell in a hole. He was evaluated at urgent care where a foreign body was noted on the anterior knee. He was told to come to the emergency department for further evaluation. He presents with 2 lacerations, one approximately 3cm laceration to the left hand and one approximately 2cm laceration to the right knee with exposed bone. Both wounds were cleaned and irrigated with normal saline and closed with simple interrupted sutures. Patient being prescribed with Keflex  outpatient for infection prevention protocol. Additionally, patient reported left sided abdominal pain. There is a visible abrasion. Chest xray shows 2 broken ribs. Patient denies SOB but given mechanism of injury, he is high risk for a splenic injury so CT chest, abdomen, pelvis has been ordered. Incentive spirometry ordered. At the time of sign out, CT not yet complete. Patient handed off to [place], [place].   Dispostion:  After consideration  of the diagnostic results and the patients response to treatment, I feel that the patient would benefit from possible further evaluation pending results of CT imaging.   Final diagnoses:  Fall, initial encounter  Laceration of left thumb without foreign body without damage to nail, initial encounter  Laceration of right knee, initial encounter  Closed fracture of multiple ribs of left side, initial encounter    ED Discharge Orders          Ordered    cephALEXin  (KEFLEX ) 500 MG capsule  2 times daily        09/21/24 2334    Incentive spirometry RT        09/21/24 2333               Torrence Marry RAMAN, PA-C 09/22/24 0052    Torrence Marry RAMAN, PA-C 09/22/24  9946    Cottie [name], MD 09/22/24 1355

## 2024-10-09 ENCOUNTER — Ambulatory Visit: Admitting: Plastic Surgery

## 2024-10-09 VITALS — BP 109/69 | HR 86

## 2024-10-09 DIAGNOSIS — W19XXXA Unspecified fall, initial encounter: Secondary | ICD-10-CM | POA: Diagnosis not present

## 2024-10-09 DIAGNOSIS — S81001A Unspecified open wound, right knee, initial encounter: Secondary | ICD-10-CM

## 2024-10-09 DIAGNOSIS — S0100XD Unspecified open wound of scalp, subsequent encounter: Secondary | ICD-10-CM

## 2024-10-09 NOTE — Progress Notes (Signed)
 Sean Skinner returns today for evaluation of the open wound on the scalp.  This is not changed appreciably and he has not had any difficulty.  He is still not interested in any more advanced reconstruction.  Since I saw him last time he had a fall striking his left ribs and his right knee.  He asked me to evaluate the wound on his knee.  He has a superficial wound approximately 3 x 3 cm.  There was some exudative material on the wound which I removed with scissors.  The wound was soaked with a Vashe gauze and then dressed with a Vashe gauze and a Mepilex border. I have encouraged him to make sure that he washes this with soap and water somewhat vigorously and then keep a Vashe soaked gauze and a Mepilex border over the wound.  I believe this will heal without difficulty.  He has no restrictions.  He will return to see me in approximately 2 weeks and we will reevaluate the knee.  I spent 30 minutes examining the knee and head, preparing the wound, dressing the wound, discussing care and documenting.

## 2024-10-23 ENCOUNTER — Ambulatory Visit: Admitting: Plastic Surgery

## 2024-10-23 VITALS — BP 124/67 | HR 88

## 2024-10-23 DIAGNOSIS — S81001D Unspecified open wound, right knee, subsequent encounter: Secondary | ICD-10-CM | POA: Diagnosis not present

## 2024-10-23 DIAGNOSIS — S0100XD Unspecified open wound of scalp, subsequent encounter: Secondary | ICD-10-CM | POA: Diagnosis not present

## 2024-10-23 NOTE — Progress Notes (Signed)
 Sean Skinner returns today for reevaluation of the abrasion on his right knee.  He is also here for evaluation of his head.  The head wound is show no change and I do not believe that he will have any change and he understands this.  The knee abrasion is healing nicely.  There is no evidence of infection and he has good granulation tissue throughout the wound.  I have encouraged him to keep it covered with a bandage to keep his pants from rubbing on the wound.  Otherwise he may just keep it clean as normal with no additional dressings at this time.  Follow-up in 2 to 3 weeks.

## 2024-11-06 DIAGNOSIS — Z85828 Personal history of other malignant neoplasm of skin: Secondary | ICD-10-CM | POA: Diagnosis not present

## 2024-11-06 DIAGNOSIS — D72119 Hypereosinophilic syndrome (hes), unspecified: Secondary | ICD-10-CM | POA: Diagnosis not present

## 2024-11-06 DIAGNOSIS — Z79899 Other long term (current) drug therapy: Secondary | ICD-10-CM | POA: Diagnosis not present

## 2024-11-06 DIAGNOSIS — D7589 Other specified diseases of blood and blood-forming organs: Secondary | ICD-10-CM | POA: Diagnosis not present

## 2024-11-06 DIAGNOSIS — N1831 Chronic kidney disease, stage 3a: Secondary | ICD-10-CM | POA: Diagnosis not present

## 2024-11-09 DIAGNOSIS — C4492 Squamous cell carcinoma of skin, unspecified: Secondary | ICD-10-CM | POA: Diagnosis not present

## 2024-11-09 DIAGNOSIS — Z79899 Other long term (current) drug therapy: Secondary | ICD-10-CM | POA: Diagnosis not present

## 2024-11-09 DIAGNOSIS — M3500 Sicca syndrome, unspecified: Secondary | ICD-10-CM | POA: Diagnosis not present

## 2024-11-09 DIAGNOSIS — Z5112 Encounter for antineoplastic immunotherapy: Secondary | ICD-10-CM | POA: Diagnosis not present

## 2024-11-13 ENCOUNTER — Encounter: Payer: Self-pay | Admitting: Plastic Surgery

## 2024-11-13 ENCOUNTER — Ambulatory Visit: Admitting: Plastic Surgery

## 2024-11-13 VITALS — BP 136/78 | HR 77 | Ht 74.25 in | Wt 197.0 lb

## 2024-11-13 DIAGNOSIS — S81001D Unspecified open wound, right knee, subsequent encounter: Secondary | ICD-10-CM

## 2024-11-17 NOTE — Progress Notes (Signed)
 Mr. Woodrome returns for evaluation of his knee wound.  It is healing quite nicely and there is a small scab on the knee approximately 1 x 1 cm.  I have encouraged him just keep the area moisturized.  I believe will continue to heal without difficulty.  He is encouraged to return for any questions or concerns.

## 2025-01-11 ENCOUNTER — Other Ambulatory Visit (HOSPITAL_BASED_OUTPATIENT_CLINIC_OR_DEPARTMENT_OTHER): Payer: Self-pay | Admitting: Internal Medicine

## 2025-01-11 DIAGNOSIS — Z7952 Long term (current) use of systemic steroids: Secondary | ICD-10-CM
